# Patient Record
Sex: Male | Born: 1945 | Race: Black or African American | Hispanic: No | Marital: Married | Smoking: Former smoker
Health system: Southern US, Community
[De-identification: ages and names within clinical notes are randomized; demographics above are authoritative.]

## PROBLEM LIST (undated history)

## (undated) DIAGNOSIS — I2510 Atherosclerotic heart disease of native coronary artery without angina pectoris: Secondary | ICD-10-CM

## (undated) DIAGNOSIS — F17210 Nicotine dependence, cigarettes, uncomplicated: Secondary | ICD-10-CM

## (undated) DIAGNOSIS — F99 Mental disorder, not otherwise specified: Secondary | ICD-10-CM

## (undated) DIAGNOSIS — I10 Essential (primary) hypertension: Secondary | ICD-10-CM

## (undated) DIAGNOSIS — K589 Irritable bowel syndrome without diarrhea: Secondary | ICD-10-CM

## (undated) DIAGNOSIS — J45909 Unspecified asthma, uncomplicated: Secondary | ICD-10-CM

## (undated) DIAGNOSIS — F1010 Alcohol abuse, uncomplicated: Secondary | ICD-10-CM

## (undated) DIAGNOSIS — N400 Enlarged prostate without lower urinary tract symptoms: Secondary | ICD-10-CM

## (undated) DIAGNOSIS — M1990 Unspecified osteoarthritis, unspecified site: Secondary | ICD-10-CM

## (undated) DIAGNOSIS — I219 Acute myocardial infarction, unspecified: Secondary | ICD-10-CM

## (undated) DIAGNOSIS — M549 Dorsalgia, unspecified: Secondary | ICD-10-CM

## (undated) DIAGNOSIS — M542 Cervicalgia: Secondary | ICD-10-CM

## (undated) HISTORY — DX: Benign prostatic hyperplasia without lower urinary tract symptoms: N40.0

## (undated) HISTORY — PX: CARDIAC CATHETERIZATION: SHX172

## (undated) HISTORY — PX: TONSILLECTOMY: SUR1361

## (undated) HISTORY — DX: Dorsalgia, unspecified: M54.9

## (undated) HISTORY — DX: Alcohol abuse, uncomplicated: F10.10

## (undated) HISTORY — DX: Irritable bowel syndrome, unspecified: K58.9

## (undated) HISTORY — DX: Nicotine dependence, cigarettes, uncomplicated: F17.210

## (undated) HISTORY — DX: Unspecified asthma, uncomplicated: J45.909

## (undated) HISTORY — DX: Atherosclerotic heart disease of native coronary artery without angina pectoris: I25.10

## (undated) HISTORY — DX: Mental disorder, not otherwise specified: F99

## (undated) HISTORY — PX: HAND SURGERY: SHX662

## (undated) HISTORY — DX: Acute myocardial infarction, unspecified: I21.9

## (undated) HISTORY — DX: Unspecified osteoarthritis, unspecified site: M19.90

## (undated) HISTORY — DX: Cervicalgia: M54.2

---

## 1997-05-06 HISTORY — PX: CYSTOSCOPY: SUR368

## 1998-07-24 ENCOUNTER — Other Ambulatory Visit: Admission: RE | Admit: 1998-07-24 | Discharge: 1998-07-24 | Payer: Self-pay | Admitting: Urology

## 1998-12-22 ENCOUNTER — Emergency Department (HOSPITAL_COMMUNITY): Admission: EM | Admit: 1998-12-22 | Discharge: 1998-12-22 | Payer: Self-pay | Admitting: Emergency Medicine

## 1999-04-08 ENCOUNTER — Emergency Department (HOSPITAL_COMMUNITY): Admission: EM | Admit: 1999-04-08 | Discharge: 1999-04-08 | Payer: Self-pay | Admitting: Emergency Medicine

## 1999-05-11 ENCOUNTER — Emergency Department (HOSPITAL_COMMUNITY): Admission: EM | Admit: 1999-05-11 | Discharge: 1999-05-11 | Payer: Self-pay | Admitting: Emergency Medicine

## 1999-05-11 ENCOUNTER — Encounter: Payer: Self-pay | Admitting: Emergency Medicine

## 1999-11-22 ENCOUNTER — Encounter: Payer: Self-pay | Admitting: *Deleted

## 1999-11-22 ENCOUNTER — Ambulatory Visit (HOSPITAL_COMMUNITY): Admission: RE | Admit: 1999-11-22 | Discharge: 1999-11-22 | Payer: Self-pay | Admitting: *Deleted

## 2000-05-03 ENCOUNTER — Encounter: Payer: Self-pay | Admitting: Emergency Medicine

## 2000-05-03 ENCOUNTER — Emergency Department (HOSPITAL_COMMUNITY): Admission: EM | Admit: 2000-05-03 | Discharge: 2000-05-03 | Payer: Self-pay | Admitting: Emergency Medicine

## 2000-08-23 ENCOUNTER — Emergency Department (HOSPITAL_COMMUNITY): Admission: EM | Admit: 2000-08-23 | Discharge: 2000-08-23 | Payer: Self-pay | Admitting: Internal Medicine

## 2000-08-23 ENCOUNTER — Encounter: Payer: Self-pay | Admitting: Emergency Medicine

## 2002-11-13 ENCOUNTER — Emergency Department (HOSPITAL_COMMUNITY): Admission: EM | Admit: 2002-11-13 | Discharge: 2002-11-13 | Payer: Self-pay

## 2002-11-26 ENCOUNTER — Encounter: Payer: Self-pay | Admitting: Emergency Medicine

## 2002-11-26 ENCOUNTER — Emergency Department (HOSPITAL_COMMUNITY): Admission: EM | Admit: 2002-11-26 | Discharge: 2002-11-26 | Payer: Self-pay | Admitting: Emergency Medicine

## 2002-12-07 ENCOUNTER — Encounter: Admission: RE | Admit: 2002-12-07 | Discharge: 2003-03-02 | Payer: Self-pay | Admitting: Sports Medicine

## 2002-12-17 ENCOUNTER — Encounter: Payer: Self-pay | Admitting: Gastroenterology

## 2002-12-17 ENCOUNTER — Encounter: Admission: RE | Admit: 2002-12-17 | Discharge: 2002-12-17 | Payer: Self-pay | Admitting: Gastroenterology

## 2003-02-16 ENCOUNTER — Encounter: Admission: RE | Admit: 2003-02-16 | Discharge: 2003-05-17 | Payer: Self-pay | Admitting: Sports Medicine

## 2003-04-20 ENCOUNTER — Ambulatory Visit (HOSPITAL_COMMUNITY): Admission: RE | Admit: 2003-04-20 | Discharge: 2003-04-20 | Payer: Self-pay | Admitting: Sports Medicine

## 2003-05-18 ENCOUNTER — Encounter: Admission: RE | Admit: 2003-05-18 | Discharge: 2003-05-24 | Payer: Self-pay | Admitting: Sports Medicine

## 2003-07-26 ENCOUNTER — Emergency Department (HOSPITAL_COMMUNITY): Admission: EM | Admit: 2003-07-26 | Discharge: 2003-07-26 | Payer: Self-pay | Admitting: Emergency Medicine

## 2003-09-06 ENCOUNTER — Encounter: Admission: RE | Admit: 2003-09-06 | Discharge: 2003-09-06 | Payer: Self-pay | Admitting: Sports Medicine

## 2004-08-31 ENCOUNTER — Ambulatory Visit: Payer: Self-pay | Admitting: Internal Medicine

## 2004-09-24 ENCOUNTER — Ambulatory Visit: Payer: Self-pay | Admitting: Pulmonary Disease

## 2004-11-14 ENCOUNTER — Ambulatory Visit: Payer: Self-pay | Admitting: Pulmonary Disease

## 2005-02-04 ENCOUNTER — Ambulatory Visit: Payer: Self-pay | Admitting: Pulmonary Disease

## 2005-02-07 ENCOUNTER — Ambulatory Visit: Payer: Self-pay | Admitting: Pulmonary Disease

## 2005-02-20 ENCOUNTER — Ambulatory Visit: Payer: Self-pay | Admitting: Pulmonary Disease

## 2005-06-06 ENCOUNTER — Ambulatory Visit: Payer: Self-pay | Admitting: Pulmonary Disease

## 2005-11-21 ENCOUNTER — Ambulatory Visit: Payer: Self-pay | Admitting: Pulmonary Disease

## 2006-05-09 ENCOUNTER — Ambulatory Visit: Payer: Self-pay | Admitting: Pulmonary Disease

## 2006-05-09 LAB — CONVERTED CEMR LAB
ALT: 20 units/L (ref 0–40)
AST: 24 units/L (ref 0–37)
Albumin: 3.6 g/dL (ref 3.5–5.2)
Alkaline Phosphatase: 90 units/L (ref 39–117)
BUN: 8 mg/dL (ref 6–23)
Basophils Relative: 0.7 % (ref 0.0–1.0)
CO2: 31 meq/L (ref 19–32)
Calcium: 9.2 mg/dL (ref 8.4–10.5)
Eosinophil percent: 1.7 % (ref 0.0–5.0)
HCT: 38.6 % — ABNORMAL LOW (ref 39.0–52.0)
Lymphocytes Relative: 16.9 % (ref 12.0–46.0)
MCV: 89.2 fL (ref 78.0–100.0)
Platelets: 256 10*3/uL (ref 150–400)
Potassium: 3.8 meq/L (ref 3.5–5.1)
RBC: 4.32 M/uL (ref 4.22–5.81)
Sodium: 142 meq/L (ref 135–145)
TSH: 2.76 microintl units/mL (ref 0.35–5.50)
Total Protein: 7.3 g/dL (ref 6.0–8.3)
WBC: 8.4 10*3/uL (ref 4.5–10.5)

## 2006-10-04 ENCOUNTER — Emergency Department (HOSPITAL_COMMUNITY): Admission: EM | Admit: 2006-10-04 | Discharge: 2006-10-05 | Payer: Self-pay | Admitting: Emergency Medicine

## 2007-02-02 ENCOUNTER — Ambulatory Visit: Payer: Self-pay | Admitting: Pulmonary Disease

## 2007-05-12 ENCOUNTER — Encounter: Payer: Self-pay | Admitting: Pulmonary Disease

## 2007-05-12 ENCOUNTER — Telehealth (INDEPENDENT_AMBULATORY_CARE_PROVIDER_SITE_OTHER): Payer: Self-pay | Admitting: *Deleted

## 2007-05-12 DIAGNOSIS — F1010 Alcohol abuse, uncomplicated: Secondary | ICD-10-CM

## 2007-05-12 DIAGNOSIS — Z8719 Personal history of other diseases of the digestive system: Secondary | ICD-10-CM | POA: Insufficient documentation

## 2007-07-11 ENCOUNTER — Emergency Department (HOSPITAL_COMMUNITY): Admission: EM | Admit: 2007-07-11 | Discharge: 2007-07-11 | Payer: Self-pay | Admitting: Emergency Medicine

## 2007-12-24 ENCOUNTER — Ambulatory Visit: Payer: Self-pay | Admitting: Pulmonary Disease

## 2007-12-24 DIAGNOSIS — M549 Dorsalgia, unspecified: Secondary | ICD-10-CM

## 2008-06-28 ENCOUNTER — Ambulatory Visit: Payer: Self-pay | Admitting: Pulmonary Disease

## 2008-06-28 DIAGNOSIS — I2510 Atherosclerotic heart disease of native coronary artery without angina pectoris: Secondary | ICD-10-CM | POA: Insufficient documentation

## 2008-06-28 DIAGNOSIS — F489 Nonpsychotic mental disorder, unspecified: Secondary | ICD-10-CM

## 2008-06-28 DIAGNOSIS — M1990 Unspecified osteoarthritis, unspecified site: Secondary | ICD-10-CM

## 2008-06-28 DIAGNOSIS — M542 Cervicalgia: Secondary | ICD-10-CM

## 2008-06-28 DIAGNOSIS — I10 Essential (primary) hypertension: Secondary | ICD-10-CM

## 2008-06-28 DIAGNOSIS — Z720 Tobacco use: Secondary | ICD-10-CM | POA: Insufficient documentation

## 2008-06-28 DIAGNOSIS — J209 Acute bronchitis, unspecified: Secondary | ICD-10-CM | POA: Insufficient documentation

## 2008-06-28 DIAGNOSIS — F17200 Nicotine dependence, unspecified, uncomplicated: Secondary | ICD-10-CM

## 2008-07-04 LAB — CONVERTED CEMR LAB
AST: 17 units/L (ref 0–37)
Alkaline Phosphatase: 74 units/L (ref 39–117)
Basophils Absolute: 0.1 10*3/uL (ref 0.0–0.1)
Basophils Relative: 1.8 % (ref 0.0–3.0)
Bilirubin, Direct: 0.1 mg/dL (ref 0.0–0.3)
CO2: 27 meq/L (ref 19–32)
Calcium: 10.1 mg/dL (ref 8.4–10.5)
Cholesterol: 180 mg/dL (ref 0–200)
Eosinophils Absolute: 0 10*3/uL (ref 0.0–0.7)
Eosinophils Relative: 0.4 % (ref 0.0–5.0)
GFR calc Af Amer: 97 mL/min
GFR calc non Af Amer: 80 mL/min
HDL: 69.4 mg/dL (ref >39.0)
Monocytes Relative: 6.4 % (ref 3.0–12.0)
Neutrophils Relative %: 54 % (ref 43.0–77.0)
PSA: 0.38 ng/mL (ref 0.10–4.00)
Platelets: 251 10*3/uL (ref 150–400)
Potassium: 4.6 meq/L (ref 3.5–5.1)
RBC: 5.33 M/uL (ref 4.22–5.81)
Total CHOL/HDL Ratio: 2.6
Total Protein: 7.6 g/dL (ref 6.0–8.3)
Triglycerides: 54 mg/dL (ref 0–149)
VLDL: 11 mg/dL (ref 0–40)
WBC: 5.1 10*3/uL (ref 4.5–10.5)

## 2008-08-17 ENCOUNTER — Encounter: Payer: Self-pay | Admitting: Pulmonary Disease

## 2008-10-06 ENCOUNTER — Emergency Department (HOSPITAL_COMMUNITY): Admission: EM | Admit: 2008-10-06 | Discharge: 2008-10-07 | Payer: Self-pay | Admitting: Emergency Medicine

## 2008-10-07 ENCOUNTER — Encounter
Admission: RE | Admit: 2008-10-07 | Discharge: 2008-10-11 | Payer: Self-pay | Admitting: Physical Medicine & Rehabilitation

## 2008-10-11 ENCOUNTER — Ambulatory Visit: Payer: Self-pay | Admitting: Physical Medicine & Rehabilitation

## 2008-12-06 ENCOUNTER — Emergency Department (HOSPITAL_COMMUNITY): Admission: EM | Admit: 2008-12-06 | Discharge: 2008-12-06 | Payer: Self-pay | Admitting: Emergency Medicine

## 2008-12-26 ENCOUNTER — Encounter
Admission: RE | Admit: 2008-12-26 | Discharge: 2008-12-29 | Payer: Self-pay | Admitting: Physical Medicine & Rehabilitation

## 2008-12-29 ENCOUNTER — Ambulatory Visit: Payer: Self-pay | Admitting: Physical Medicine & Rehabilitation

## 2008-12-30 ENCOUNTER — Emergency Department (HOSPITAL_COMMUNITY): Admission: EM | Admit: 2008-12-30 | Discharge: 2008-12-30 | Payer: Self-pay | Admitting: Emergency Medicine

## 2009-01-03 ENCOUNTER — Encounter
Admission: RE | Admit: 2009-01-03 | Discharge: 2009-01-18 | Payer: Self-pay | Admitting: Physical Medicine & Rehabilitation

## 2009-06-13 ENCOUNTER — Emergency Department (HOSPITAL_COMMUNITY): Admission: EM | Admit: 2009-06-13 | Discharge: 2009-06-14 | Payer: Self-pay | Admitting: Emergency Medicine

## 2009-08-06 ENCOUNTER — Emergency Department (HOSPITAL_COMMUNITY): Admission: EM | Admit: 2009-08-06 | Discharge: 2009-08-06 | Payer: Self-pay | Admitting: Emergency Medicine

## 2009-09-11 ENCOUNTER — Telehealth: Payer: Self-pay | Admitting: Pulmonary Disease

## 2009-12-23 ENCOUNTER — Emergency Department (HOSPITAL_COMMUNITY): Admission: EM | Admit: 2009-12-23 | Discharge: 2009-12-23 | Payer: Self-pay | Admitting: Emergency Medicine

## 2010-01-17 ENCOUNTER — Emergency Department (HOSPITAL_COMMUNITY): Admission: EM | Admit: 2010-01-17 | Discharge: 2010-01-17 | Payer: Self-pay | Admitting: Emergency Medicine

## 2010-03-23 ENCOUNTER — Emergency Department (HOSPITAL_COMMUNITY): Admission: EM | Admit: 2010-03-23 | Discharge: 2010-03-23 | Payer: Self-pay | Admitting: Emergency Medicine

## 2010-04-05 ENCOUNTER — Emergency Department (HOSPITAL_COMMUNITY)
Admission: EM | Admit: 2010-04-05 | Discharge: 2010-04-05 | Payer: Self-pay | Source: Home / Self Care | Admitting: Emergency Medicine

## 2010-04-28 ENCOUNTER — Emergency Department (HOSPITAL_COMMUNITY)
Admission: EM | Admit: 2010-04-28 | Discharge: 2010-04-28 | Payer: Self-pay | Source: Home / Self Care | Admitting: Emergency Medicine

## 2010-05-28 ENCOUNTER — Ambulatory Visit: Admission: EM | Admit: 2010-05-28 | Payer: Self-pay | Source: Home / Self Care | Admitting: Pulmonary Disease

## 2010-05-29 ENCOUNTER — Telehealth: Payer: Self-pay | Admitting: Pulmonary Disease

## 2010-06-05 NOTE — Progress Notes (Signed)
Summary: nos appt  Phone Note Call from Patient   Caller: [email or web address]  Call For: nadel Summary of Call: ATC pt, phone disconnected contact info, number is incorrect. Initial call taken by: Darletta Moll,  Sep 11, 2009 10:51 AM

## 2010-06-06 ENCOUNTER — Emergency Department (HOSPITAL_COMMUNITY)
Admission: EM | Admit: 2010-06-06 | Discharge: 2010-06-06 | Disposition: A | Discharge status: Home / Self Care | Payer: Medicare Other | Attending: Emergency Medicine | Admitting: Emergency Medicine

## 2010-06-06 DIAGNOSIS — I10 Essential (primary) hypertension: Secondary | ICD-10-CM | POA: Insufficient documentation

## 2010-06-06 DIAGNOSIS — M545 Low back pain, unspecified: Secondary | ICD-10-CM | POA: Insufficient documentation

## 2010-06-06 DIAGNOSIS — R599 Enlarged lymph nodes, unspecified: Secondary | ICD-10-CM | POA: Insufficient documentation

## 2010-06-06 DIAGNOSIS — M79609 Pain in unspecified limb: Secondary | ICD-10-CM | POA: Insufficient documentation

## 2010-06-06 DIAGNOSIS — G8929 Other chronic pain: Secondary | ICD-10-CM | POA: Insufficient documentation

## 2010-06-07 NOTE — Progress Notes (Signed)
Summary: nos appt  Phone Note Call from Patient   Caller: [email or web address]  Call For: Roark Rufo Summary of Call: ATC pt to [facility]sc nos from 1/23 phone disconnected. Initial call taken by: Darletta Moll,  May 29, 2010 9:43 AM

## 2010-06-24 ENCOUNTER — Emergency Department (HOSPITAL_COMMUNITY)
Admission: EM | Admit: 2010-06-24 | Discharge: 2010-06-24 | Disposition: A | Discharge status: Home / Self Care | Payer: Medicare Other | Attending: Emergency Medicine | Admitting: Emergency Medicine

## 2010-06-24 DIAGNOSIS — I10 Essential (primary) hypertension: Secondary | ICD-10-CM | POA: Insufficient documentation

## 2010-06-24 DIAGNOSIS — M545 Low back pain, unspecified: Secondary | ICD-10-CM | POA: Insufficient documentation

## 2010-06-24 DIAGNOSIS — G8929 Other chronic pain: Secondary | ICD-10-CM | POA: Insufficient documentation

## 2010-06-24 DIAGNOSIS — R109 Unspecified abdominal pain: Secondary | ICD-10-CM | POA: Insufficient documentation

## 2010-06-24 DIAGNOSIS — M546 Pain in thoracic spine: Secondary | ICD-10-CM | POA: Insufficient documentation

## 2010-06-24 DIAGNOSIS — IMO0001 Reserved for inherently not codable concepts without codable children: Secondary | ICD-10-CM | POA: Insufficient documentation

## 2010-07-25 LAB — CBC
HCT: 41.2 % (ref 39.0–52.0)
Hemoglobin: 13.8 g/dL (ref 13.0–17.0)
Platelets: 202 10*3/uL (ref 150–400)
RBC: 4.41 MIL/uL (ref 4.22–5.81)
RBC: 4.46 MIL/uL (ref 4.22–5.81)
RDW: 13.5 % (ref 11.5–15.5)
WBC: 4.6 10*3/uL (ref 4.0–10.5)
WBC: 5.2 10*3/uL (ref 4.0–10.5)

## 2010-07-25 LAB — POCT CARDIAC MARKERS
CKMB, poc: <1 ng/mL — ABNORMAL LOW (ref 1.0–8.0)
CKMB, poc: <1 ng/mL — ABNORMAL LOW (ref 1.0–8.0)
Myoglobin, poc: 85.3 ng/mL (ref 12–200)
Troponin i, poc: <0.05 ng/mL (ref 0.00–0.09)

## 2010-07-25 LAB — BASIC METABOLIC PANEL
BUN: 11 mg/dL (ref 6–23)
BUN: 9 mg/dL (ref 6–23)
Calcium: 8.9 mg/dL (ref 8.4–10.5)
Calcium: 9.3 mg/dL (ref 8.4–10.5)
Creatinine, Ser: 1.18 mg/dL (ref 0.4–1.5)
GFR calc Af Amer: >60 mL/min (ref >60)
GFR calc non Af Amer: >60 mL/min (ref >60)
Glucose, Bld: 105 mg/dL — ABNORMAL HIGH (ref 70–99)
Potassium: 4 mEq/L (ref 3.5–5.1)
Sodium: 137 mEq/L (ref 135–145)

## 2010-07-25 LAB — DIFFERENTIAL
Basophils Relative: 0 % (ref 0–1)
Eosinophils Relative: 1 % (ref 0–5)
Lymphocytes Relative: 41 % (ref 12–46)
Lymphs Abs: 1.9 10*3/uL (ref 0.7–4.0)
Monocytes Absolute: 0.4 10*3/uL (ref 0.1–1.0)
Monocytes Absolute: 0.3 10*3/uL (ref 0.1–1.0)
Monocytes Relative: 8 % (ref 3–12)
Monocytes Relative: 6 % (ref 3–12)
Neutro Abs: 2.2 10*3/uL (ref 1.7–7.7)
Neutrophils Relative %: 66 % (ref 43–77)

## 2010-07-25 LAB — D-DIMER, QUANTITATIVE: D-Dimer, Quant: 0.23 ug/mL-FEU (ref 0.00–0.48)

## 2010-08-11 LAB — POCT I-STAT, CHEM 8
BUN: 9 mg/dL (ref 6–23)
Calcium, Ion: 1.17 mmol/L (ref 1.12–1.32)
Chloride: 101 meq/L (ref 96–112)
Creatinine, Ser: 1.3 mg/dL (ref 0.4–1.5)
Glucose, Bld: 94 mg/dL (ref 70–99)
HCT: 46 % (ref 39.0–52.0)
Hemoglobin: 15.6 g/dL (ref 13.0–17.0)
Potassium: 3.8 mEq/L (ref 3.5–5.1)
Sodium: 138 mEq/L (ref 135–145)
TCO2: 25 mmol/L (ref 0–100)

## 2010-08-11 LAB — POCT CARDIAC MARKERS
CKMB, poc: <1 ng/mL — ABNORMAL LOW (ref 1.0–8.0)
Myoglobin, poc: 106 ng/mL (ref 12–200)
Troponin i, poc: <0.05 ng/mL (ref 0.00–0.09)

## 2010-08-11 LAB — D-DIMER, QUANTITATIVE: D-Dimer, Quant: 0.36 ug{FEU}/mL (ref 0.00–0.48)

## 2010-08-13 LAB — TROPONIN I: Troponin I: 0.03 ng/mL (ref 0.00–0.06)

## 2010-08-13 LAB — BASIC METABOLIC PANEL
CO2: 23 mEq/L (ref 19–32)
Calcium: 9.4 mg/dL (ref 8.4–10.5)
GFR calc Af Amer: >60 mL/min (ref >60)
Sodium: 138 mEq/L (ref 135–145)

## 2010-08-13 LAB — DIFFERENTIAL
Basophils Absolute: 0.1 10*3/uL (ref 0.0–0.1)
Lymphocytes Relative: 36 % (ref 12–46)
Monocytes Absolute: 0.4 10*3/uL (ref 0.1–1.0)
Neutro Abs: 3.2 10*3/uL (ref 1.7–7.7)

## 2010-08-13 LAB — CK TOTAL AND CKMB (NOT AT ARMC)
CK, MB: 2.5 ng/mL (ref 0.3–4.0)
Relative Index: 0.7 (ref 0.0–2.5)

## 2010-08-13 LAB — CBC
Hemoglobin: 15.5 g/dL (ref 13.0–17.0)
MCHC: 34 g/dL (ref 30.0–36.0)
RBC: 4.89 MIL/uL (ref 4.22–5.81)
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
## 2010-09-18 NOTE — Assessment & Plan Note (Signed)
A 65 year old male, who was last seen by me with initial consultation on  October 11, 2008.  He has been previously evaluated by spine surgery in  April for the low back and buttock pain as well as neck pain.  He has  had imaging studies showing L5-S1 disk narrowing with endplate changes,  but no definite nerve root encroachment.  He also has cervical  spondylosis noted, but no significant cord impingement or foraminal  stenosis.  Not felt to be a surgical candidate.   His social history is complicated.  He has a history of alcoholism.  Last drink was sometime early June or late May per his report.  He also  shared with me today and he was incarcerated up until January of this  year.   He is in the interval time gone to the ER to resume some pain  medications.  I did not think he was a good narcotic analgesic candidate  due to his ethanol abuse history.   A urine drug screen also demonstrated some non reported Valium.   PHYSICAL EXAMINATION:  GENERAL:  No acute distress.  Mood and affect  appropriate.  EXTREMITIES:  Without edema.  His gait is normal.  His range of motion  is 75% range in the cervical spine, forward flexion, extension, lateral  rotation, and bending.  In the lumbar spine, he has 100% forward  flexion, 75% extension.  Deep tendon reflexes are normal in the upper  and lower extremity.  Range of motion normal in the upper and lower  extremity.  Sensation is normal in the upper and lower extremity.   IMPRESSION:  1. Cervical spondylosis with chronic neck pain.  No radicular      discomfort.  2. Lumbar degenerative disk.  No radicular symptoms.   RECOMMENDATIONS:  I had previously recommended physical therapy to treat  the above.  He has not followed through on this and that has gone to the  ED and received some narcotic analgesics.   I have rewritten CT orders.  I will see him back in 3 months, but I  would first like for him to go through therapy before I reevaluate  him  to see what the next step is.      Erick Colace, M.D.  Electronically Signed     AEK/MedQ  D:  12/29/2008 12:40:17  T:  12/30/2008 02:21:31  Job #:  [id number]

## 2010-09-21 NOTE — Cardiovascular Report (Signed)
[facility]  Patient:    Steven Lowery, Steven Lowery                      MRN: [id number] Proc. Date: 11/22/99 Adm. Date:  [id number] Attending:  Daisey Must CC:         Lonzo Cloud. Kriste Basque, M.D. [facility]             Daisey Must, M.D. [facility]             Cardiac Catheterization Lab                        Cardiac Catheterization  PROCEDURES PERFORMED: 1. Left heart catheterization. 2. Coronary angiography. 3. Left ventriculography.  INDICATIONS:  Steven Lowery is a 65 year old male who presented with chest pain. A stress Cardiolite was interpreted as revealing mild anterior ischemia.  PROCEDURAL NOTE:  A 6-French sheath was placed in the right femoral artery. Standard Judkins 6-French catheters were utilized.  CONTRAST:  Omnipaque.  COMPLICATIONS:  None.  RESULTS:  HEMODYNAMICS: 1. Left ventricular pressure:  140/13. 2. Aortic pressure:  152/80.  There was no aortic valve gradient.  LEFT VENTRICULOGRAM:  Wall motion is normal.  Ejection fraction estimated at 60%.  CORONARY ANGIOGRAPHY: (Right dominant). 1. LEFT MAIN:  Normal. 2. LEFT ANTERIOR DESCENDING:  Has a 40% stenosis in the mid vessel, just    after the first diagonal branch.  The LAD gives rise to a normal-sized    first diagonal branch and a small second diagonal branch. 3. LEFT CIRCUMFLEX:  Gives rise to a small OM1, normal-sized OM2 and    normal-sized OM3.  The left circumflex is free of angiographic disease. 4. RIGHT CORONARY ARTERY:  Gives rise to a normal-sized posterior descending    artery, a small posterolateral branch.  The right coronary artery is free    of angiographic disease.  IMPRESSION: 1. Normal left ventricular systolic function. 2. Nonobstructive coronary artery disease. DD:  11/22/99 TD:  11/22/99 Job: [id number]

## 2010-09-21 NOTE — Letter (Signed)
May 15, 2006    Mark C. Vernie Ammons, M.D.  [address]  [place], [place] [address]   RE:  Steven Lowery, Steven Lowery  MRN:  [id number]  /  DOB:  [birth year]   Dear Loraine Leriche:   Mr. Steven Lowery is a 65 year old black gentleman whom I have seen for  general medical purposes.  He has a history of asthmatic bronchitis,  nonobstructive coronary disease, irritable bowel syndrome, degenerative  arthritis with low back pain.  He has seen Dr. Farris Has for orthopedics  and Dr. Victorino Dike for GI.   He presents with symptoms of an irritated bladder.  He states that he  goes to the bathroom about every hour and has done so for quite a while.  He tried Detrol LA and I recommended urologic evaluation, but he never  went for this evaluation previously.  He also has erectile dysfunction  and uses Viagra.   CURRENT MEDICATIONS:  1. Enteric-coated aspirin 1 a day.  2. Neurontin 600 mg p.o. b.i.d.  3. Nexium 40 mg p.o. daily.  4. Vicodin every 8 hours as needed for pain.  5. Viagra as needed.   I did give him some samples of Vesicare 5 mg to take 1 a day and  recommended the urologic evaluation because of his irritated bladder  symptoms, and (I should note) a history of blood in  his urine per his history.  In reviewing my old records, he had a  urinalysis last year that showed 5-10 red cells and a culture that  showed 5000 colonies only.   Thank you very much for evaluation of this nice gentleman.  I look  forward to hearing your comments.    Sincerely,      Lonzo Cloud. Kriste Basque, MD  Electronically Signed    SMN/MedQ  DD: 05/15/2006  DT: 05/15/2006  Job #: [id number]

## 2010-10-08 ENCOUNTER — Emergency Department (HOSPITAL_COMMUNITY)
Admission: EM | Admit: 2010-10-08 | Discharge: 2010-10-09 | Disposition: A | Discharge status: Home / Self Care | Payer: Medicare Other | Attending: Emergency Medicine | Admitting: Emergency Medicine

## 2010-10-08 DIAGNOSIS — M542 Cervicalgia: Secondary | ICD-10-CM | POA: Insufficient documentation

## 2010-10-08 DIAGNOSIS — G8929 Other chronic pain: Secondary | ICD-10-CM | POA: Insufficient documentation

## 2010-10-08 DIAGNOSIS — M549 Dorsalgia, unspecified: Secondary | ICD-10-CM | POA: Insufficient documentation

## 2010-10-08 DIAGNOSIS — Y92009 Unspecified place in unspecified non-institutional (private) residence as the place of occurrence of the external cause: Secondary | ICD-10-CM | POA: Insufficient documentation

## 2010-10-08 DIAGNOSIS — I10 Essential (primary) hypertension: Secondary | ICD-10-CM | POA: Insufficient documentation

## 2010-10-08 DIAGNOSIS — R1310 Dysphagia, unspecified: Secondary | ICD-10-CM | POA: Insufficient documentation

## 2010-10-08 DIAGNOSIS — IMO0002 Reserved for concepts with insufficient information to code with codable children: Secondary | ICD-10-CM | POA: Insufficient documentation

## 2010-10-08 DIAGNOSIS — S0993XA Unspecified injury of face, initial encounter: Secondary | ICD-10-CM | POA: Insufficient documentation

## 2010-10-08 DIAGNOSIS — Z7982 Long term (current) use of aspirin: Secondary | ICD-10-CM | POA: Insufficient documentation

## 2010-10-08 DIAGNOSIS — Z79899 Other long term (current) drug therapy: Secondary | ICD-10-CM | POA: Insufficient documentation

## 2010-10-08 DIAGNOSIS — T148XXA Other injury of unspecified body region, initial encounter: Secondary | ICD-10-CM | POA: Insufficient documentation

## 2010-10-09 ENCOUNTER — Emergency Department (HOSPITAL_COMMUNITY): Payer: Medicare Other

## 2010-11-02 ENCOUNTER — Emergency Department (HOSPITAL_COMMUNITY): Payer: Medicare Other

## 2010-11-02 ENCOUNTER — Encounter (HOSPITAL_COMMUNITY): Payer: Self-pay | Admitting: Radiology

## 2010-11-02 ENCOUNTER — Inpatient Hospital Stay (HOSPITAL_COMMUNITY)
Admission: EM | Admit: 2010-11-02 | Discharge: 2010-11-03 | DRG: 313 | Disposition: A | Discharge status: Home / Self Care | Payer: Medicare Other | Attending: Cardiology | Admitting: Cardiology

## 2010-11-02 DIAGNOSIS — R0789 Other chest pain: Principal | ICD-10-CM | POA: Diagnosis present

## 2010-11-02 DIAGNOSIS — I10 Essential (primary) hypertension: Secondary | ICD-10-CM | POA: Diagnosis present

## 2010-11-02 DIAGNOSIS — N400 Enlarged prostate without lower urinary tract symptoms: Secondary | ICD-10-CM | POA: Diagnosis present

## 2010-11-02 DIAGNOSIS — Z7982 Long term (current) use of aspirin: Secondary | ICD-10-CM

## 2010-11-02 DIAGNOSIS — F341 Dysthymic disorder: Secondary | ICD-10-CM | POA: Diagnosis present

## 2010-11-02 DIAGNOSIS — I2510 Atherosclerotic heart disease of native coronary artery without angina pectoris: Secondary | ICD-10-CM | POA: Diagnosis present

## 2010-11-02 DIAGNOSIS — F17200 Nicotine dependence, unspecified, uncomplicated: Secondary | ICD-10-CM | POA: Diagnosis present

## 2010-11-02 DIAGNOSIS — K589 Irritable bowel syndrome without diarrhea: Secondary | ICD-10-CM | POA: Diagnosis present

## 2010-11-02 DIAGNOSIS — R079 Chest pain, unspecified: Secondary | ICD-10-CM

## 2010-11-02 HISTORY — DX: Essential (primary) hypertension: I10

## 2010-11-02 LAB — TROPONIN I: Troponin I: <0.3 ng/mL (ref <0.30)

## 2010-11-02 LAB — COMPREHENSIVE METABOLIC PANEL
ALT: 15 U/L (ref 0–53)
Alkaline Phosphatase: 87 U/L (ref 39–117)
CO2: 29 mEq/L (ref 19–32)
Chloride: 105 mEq/L (ref 96–112)
GFR calc Af Amer: >60 mL/min (ref >60)
GFR calc non Af Amer: >60 mL/min (ref >60)
Glucose, Bld: 108 mg/dL — ABNORMAL HIGH (ref 70–99)
Potassium: 4.5 mEq/L (ref 3.5–5.1)
Sodium: 139 mEq/L (ref 135–145)

## 2010-11-02 LAB — CBC
Hemoglobin: 13.2 g/dL (ref 13.0–17.0)
RBC: 4.32 MIL/uL (ref 4.22–5.81)

## 2010-11-02 LAB — CK TOTAL AND CKMB (NOT AT ARMC): Relative Index: INVALID (ref 0.0–2.5)

## 2010-11-02 MED ORDER — IOHEXOL 350 MG/ML SOLN: 80.0000 mL | Freq: Once | INTRAVENOUS | Status: AC | PRN

## 2010-11-03 ENCOUNTER — Inpatient Hospital Stay (HOSPITAL_COMMUNITY): Payer: Medicare Other

## 2010-11-03 LAB — LIPID PANEL
HDL: 69 mg/dL (ref >39)
LDL Cholesterol: 65 mg/dL (ref 0–99)
Total CHOL/HDL Ratio: 2.2 RATIO
Triglycerides: 78 mg/dL (ref <150)

## 2010-11-03 LAB — CARDIAC PANEL(CRET KIN+CKTOT+MB+TROPI): Relative Index: INVALID (ref 0.0–2.5)

## 2010-11-03 LAB — HEMOGLOBIN A1C
Hgb A1c MFr Bld: 5.8 % — ABNORMAL HIGH (ref <5.7)
Mean Plasma Glucose: 120 mg/dL — ABNORMAL HIGH (ref <117)

## 2010-11-03 MED ORDER — TECHNETIUM TC 99M TETROFOSMIN IV KIT
10.0000 | PACK | Freq: Once | INTRAVENOUS | Status: AC | PRN
  Administered 2010-11-03: 10 via INTRAVENOUS

## 2010-11-03 MED ORDER — TECHNETIUM TC 99M TETROFOSMIN IV KIT
30.0000 | PACK | Freq: Once | INTRAVENOUS | Status: AC | PRN
  Administered 2010-11-03: 30 via INTRAVENOUS

## 2010-11-04 ENCOUNTER — Other Ambulatory Visit (HOSPITAL_COMMUNITY): Payer: Medicare Other

## 2010-11-09 NOTE — Discharge Summary (Signed)
Steven Lowery, Steven Lowery NO.:  [id number]  MEDICAL RECORD NO.:  [id number]  LOCATION:  3711                         FACILITY:  [facility]  PHYSICIAN:  Jonelle Sidle, MD DATE OF BIRTH:  [birth year]  DATE OF ADMISSION:  11/02/2010 DATE OF DISCHARGE:  11/03/2010                              DISCHARGE SUMMARY   PRIMARY CARE PROVIDER:  Lonzo Cloud. Kriste Basque, MD  PRIMARY CARDIOLOGIST:  He used to see Dr. Gerri Spore.  DISCHARGE DIAGNOSES: 1. Atypical chest pain with negative stress Myoview this admission. 2. Nonobstructive coronary artery disease per cardiac catheterization     in 2001.     a.     Left anterior descending 40% with no other disease and an      ejection fraction of 60%. 3. Hypertension.  SECONDARY DIAGNOSES: 1. Irritable bowel syndrome. 2. Anxiety and depression. 3. Benign prostatic hypertrophy. 4. History of degenerative joint disease and cervical spondylosis.  ALLERGIES:  No known drug allergies.  PROCEDURES/DIAGNOSTICS:  Performed during hospitalization: 1. Stress Myoview on November 03, 2010:  Fixed defect of the inferior wall     consistent with artifact versus scar.  There was no definite     inducible ischemia with exercise stress.  Calculated ejection     fraction 49%. 2. CT angiography of the heart on November 02, 2010:  Moderate, possible     obstructive coronary artery disease.  Mixed calcified and     noncalcified plaque involving mid left anterior descending which is     possibly instructed.  No coronary artery disease elsewhere.  The     left ventricular hypertrophy noted.  Right coronary artery     dominant.  Total coronary artery calcium score 51.26.  Noted     chronic obstructive pulmonary disease/emphysema with scarring and     mild atelectasis in the visualized lung base. 3. Chest x-ray on November 02, 2010:  Emphysematous changes and scarring     in the lungs.  No evidence of active pulmonary disease.  REASON FOR HOSPITALIZATION:  This is a  65 year old African American gentleman with history of nonobstructive coronary artery disease as above who states he was in his usual state of health until several days prior to admission where he had onset of left-sided and substernal chest pain.  The patient complained his symptoms are coming from his neck down his left arm and possibly into his back.  Symptoms lasted greater than 24 hours.  They were not exertional or positional.  When his symptoms did not resolve, the patient presented to the emergency department for further evaluation.  His pain did resolve after administration of aspirin 325 mg, Lopressor 5 mg IV as well as 50 mg p.o., nitroglycerin paste, and 8 mg of morphine.  His EKG showed sinus rhythm at a rate of 64 beats per minute with no acute ischemic changes.  The patient underwent a cardiac CT showing possible obstructive LAD lesion.  With these results, Cardiology was asked to evaluate the patient further. The patient was admitted for further evaluation.  HOSPITAL COURSE:  The patient was admitted to the telemetry unit.  He did rule out myocardial infarction with cardiac enzymes being negative  x3.  He underwent a treadmill Myoview to assess the functional significance of the questionable 50% lesion in the LAD per cardiac CT. The patient had no further complaints of chest pain.  The patient's stress Myoview on November 03, 2010, showed a fixed defect of the inferior wall consistent with artifact versus a scar.  There is no definitive inducible ischemia with exercise stress.  The patient's calculated ejection fraction was 49%.  With the patient's atypical symptoms as well as low-risk Myoview, it was felt the patient was stable for discharge with no further ischemic evaluation.  On day of discharge, Dr. Diona Browner evaluated the patient and noted him stable for home.  There was a long discussion about consistent follow up with his primary care provider for better blood pressure  control.  He was started on lisinopril 5 mg daily for stabilization of his blood pressures.  The patient's lipid panel did show cholesterol 150, triglycerides 78, HDL 69, and LDL 65.  Further treatment at this time will be left to Dr. Kriste Basque.  The patient is also with complaints of arthralgias in his neck and back and again these should be followed up further with Dr. Kriste Basque.  The patient voiced understanding.  If the patient does have further cardiac symptoms, then he can further be assessed by Dr. Andrey Campanile.  Smoking cessation as well as watching alcohol intake have been encouraged.  DISCHARGE LABORATORY DATA:  Cardiac enzymes negative x3.  Hemoglobin A1c 5.8.  TSH 1.255, cholesterol 150, triglycerides 78, HDL 69, LDL 65.  DISCHARGE MEDICATIONS: 1. Aspirin 81 mg 1 tablet daily. 2. Lisinopril 5 mg 1 tablet daily. 3. Nitroglycerin 0.4 mg sublingual 1 tablet under the tongue at the     onset chest pain, may repeat every 5 minutes for up to three doses.  FOLLOWUP PLANS AND INSTRUCTIONS: 1. The patient is to follow up with Dr. Kriste Basque in the next 1-2 weeks,     and the office will call to schedule this appointment. 2. The patient is to increase activity as tolerated. 3. The patient is to continue low-sodium, heart-healthy diet. 4. The patient is to stop any activities that cause chest pain or     shortness of breath. 5. The patient is to call our office as needed.  DURATION OF DISCHARGE:  Greater than 30 minutes with physician.     Leonette Monarch, PA-C   ______________________________ Jonelle Sidle, MD    NB/MEDQ  D:  11/03/2010  T:  11/03/2010  Job:  [id number]  cc:   Lonzo Cloud. Kriste Basque, MD [facility]  Electronically Signed by Alen Blew P.A. on 11/08/2010 07:18:11 PM Electronically Signed by Nona Dell MD on 11/09/2010 04:47:29 PM

## 2010-12-03 NOTE — H&P (Signed)
Steven Lowery, MCCARLEY NO.:  [id number]  MEDICAL RECORD NO.:  [id number]  LOCATION:  3711                         FACILITY:  [facility]  PHYSICIAN:  Cassell Clement, M.D. DATE OF BIRTH:  [birth year]  DATE OF ADMISSION:  11/02/2010 DATE OF DISCHARGE:                             HISTORY & PHYSICAL   PRIMARY CARE PHYSICIAN:  Lonzo Cloud. Kriste Basque, MD  PRIMARY CARDIOLOGIST:  Carole Binning, MD, [facility]  CHIEF COMPLAINT:  Chest pain.  HISTORY OF PRESENT ILLNESS:  Mr. Whitecotton is a 65 year old male with a history of nonobstructive coronary artery disease.  He was in his usual state of health until October 31, 2010, p.m. when he had onset of left- sided and substernal chest pain.  It radiated to his left arm and possibly to his back.  The symptoms lasted all night.  No medication was tried.  There was no exertional component.  It was not positional or clearly pleuritic.  He came to the emergency room yesterday finally when his symptoms did not resolve.  He received multiple medications including aspirin 325 mg, Lopressor 5 mg IV and 50 mg p.o., 8 mg of morphine, nitroglycerin paste totaling 2 inches, and sublingual nitroglycerin.  His pain has now resolved, but he is not sure exactly when or which medicines made the most difference.  He had pain for more than 24 hours.  A cardiac CT showed a possibly obstructive LAD lesion, and Cardiology was asked to evaluate him.  Currently Mr. Lucchese is resting comfortably and says his chest pain is 0/10.  PAST MEDICAL HISTORY: 1. Status post cardiac catheterization in 2001 showing LAD 40%, no     other disease and an EF of 60%. 2. Borderline hypertension. 3. History of IBS. 4. History of asthmatic bronchitis. 5. History of anxiety and depression. 6. BPH. 7. History of DJD and cervical spondylosis.  SURGICAL HISTORY:  He is status post cardiac catheterization as well as cystoscopy and flex sig.  ALLERGIES:  No known drug  allergies.  CURRENT MEDICATIONS: 1. Aspirin. 2. Percocet. 3. Seroquel, doses unclear.  SOCIAL HISTORY:  He lives in [place] with family nearby.  He is a retired Data processing manager.  He has approximately 30 pack-year history of ongoing tobacco use.  He has a history of a binge drinking, but says he drinks a quart of beer about every other week now.  He denies drug use.  FAMILY HISTORY:  Both of his parents were in their 22s when they died, and his mother had cardiac issues, but his father did not.  He has at least one brother who has had an MI and two sisters who had pacemakers.  REVIEW OF SYSTEMS:  He has occasional arthralgias with neck and back pain.  He denies reflux symptoms or melena.  He coughs rarely, and it is nonproductive.  He has not had any recent illnesses, fevers, or chills. The chest pain is described alternately as a pressure and tightness. There are no other associated symptoms with it except possibly some shortness of breath.  The shortness of breath has resolved as well as the chest pain.  Full 14-point review of systems is otherwise negative except as stated  in the HPI.  PHYSICAL EXAMINATION:  VITAL SIGNS:  Temperature 97.9, blood pressure 108/61, heart rate 62, respiratory rate 16, O2 saturation 96% on room air. GENERAL:  He is a well-developed, well-nourished African American male in no acute distress. HEENT:  Normal. NECK:  There is no lymphadenopathy, thyromegaly, bruit, or JVD noted. CV:  His heart is regular in rate and rhythm with an S1 and S2, and no significant murmur or gallop is noted.  Distal pulses are intact in all four extremities. LUNGS:  Clear to auscultation bilaterally. SKIN:  No rashes or lesions are noted. ABDOMEN:  Soft and nontender with active bowel sounds. EXTREMITIES:  There is no cyanosis, clubbing, or edema noted. MUSCULOSKELETAL:  There is no joint deformity or effusions, and no spine or CVA tenderness. NEURO:  He is alert and  oriented with cranial nerves II through XII grossly intact.  CT angiogram of the chest showed possibly obstructive calcified and noncalcified plaque in the LAD near 50%, otherwise normal with a calcium score of 51.6.  Chest x-ray showed scarring and emphysema, but no acute disease.  EKG, sinus rhythm, rate 64 with no acute ischemic changes.  LABORATORY VALUES:  Hemoglobin 13.2, hematocrit 38, WBCs 4.8, platelets 200.  Sodium 139, potassium 4.5, chloride 105, CO2 of 29, BUN 20, creatinine 1.21, glucose 108.  CK-MB and troponin I negative x2.  IMPRESSION:  Mr. Lombardo was seen today by Dr. Patty Sermons, the patient evaluated and the data reviewed.  He has atypical chest pain that was finally relieved in the ER after multiple medications including aspirin, nitro, and morphine.  Cardiac CT shows a 50% LAD lesion, which had been 40% in cath in 2001.  His EKG shows sinus rhythm with no ischemic changes.  IMPRESSION:  We doubt the 50% lesion is obstructive, but we will get a treadmill Myoview to assess its functional significance.  If this is negative, home later Saturday with blood pressure followup by Dr. Kriste Basque. His blood pressure has been borderline for several years, and we will add lisinopril 5 mg daily to his medication regimen.     Theodore Demark, PA-C   ______________________________ Cassell Clement, M.D.    RB/MEDQ  D:  11/02/2010  T:  11/03/2010  Job:  [id number]  Electronically Signed by Theodore Demark PA-C on 11/20/2010 05:02:25 PM Electronically Signed by Cassell Clement M.D. on 12/03/2010 12:44:22 PM

## 2011-01-16 ENCOUNTER — Encounter: Payer: Self-pay | Admitting: Pulmonary Disease

## 2011-01-16 ENCOUNTER — Ambulatory Visit (INDEPENDENT_AMBULATORY_CARE_PROVIDER_SITE_OTHER): Payer: Medicare Other | Admitting: Pulmonary Disease

## 2011-01-16 DIAGNOSIS — F17200 Nicotine dependence, unspecified, uncomplicated: Secondary | ICD-10-CM

## 2011-01-16 DIAGNOSIS — I10 Essential (primary) hypertension: Secondary | ICD-10-CM

## 2011-01-16 DIAGNOSIS — Z23 Encounter for immunization: Secondary | ICD-10-CM

## 2011-01-16 DIAGNOSIS — M542 Cervicalgia: Secondary | ICD-10-CM

## 2011-01-16 DIAGNOSIS — J439 Emphysema, unspecified: Secondary | ICD-10-CM

## 2011-01-16 DIAGNOSIS — M1990 Unspecified osteoarthritis, unspecified site: Secondary | ICD-10-CM

## 2011-01-16 DIAGNOSIS — Z87898 Personal history of other specified conditions: Secondary | ICD-10-CM

## 2011-01-16 DIAGNOSIS — Z8719 Personal history of other diseases of the digestive system: Secondary | ICD-10-CM

## 2011-01-16 DIAGNOSIS — I2510 Atherosclerotic heart disease of native coronary artery without angina pectoris: Secondary | ICD-10-CM

## 2011-01-16 DIAGNOSIS — J438 Other emphysema: Secondary | ICD-10-CM

## 2011-01-16 MED ORDER — TRAMADOL HCL 50 MG PO TABS: 50.0000 mg | ORAL_TABLET | Freq: Three times a day (TID) | ORAL | Status: DC | PRN

## 2011-01-16 MED ORDER — LISINOPRIL 5 MG PO TABS: 5.0000 mg | ORAL_TABLET | Freq: Every day | ORAL | Status: DC

## 2011-01-16 NOTE — Patient Instructions (Signed)
Steven Lowery, it was good seeing you again...  Today we refilled your LISINOPRIL heart & pressure pill (take one daily);     And your TRAMADOL pain & inflammation pill (take up to 3 times daily as needed for pain)...  Today we did a pulmonary function test & the results indicate that you MUST QUIT ALL SMOKING IMMEDIATELY...  Let's plan a follow up appt in 4-6 months.Marland KitchenMarland Kitchen
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
## 2011-01-28 LAB — BASIC METABOLIC PANEL
CO2: 25
Chloride: 107
Creatinine, Ser: 0.93
GFR calc Af Amer: >60
Potassium: 3.7
Sodium: 142

## 2011-01-28 LAB — CBC
HCT: 43.3
Hemoglobin: 14.6
MCHC: 33.8
MCV: 90.5
RBC: 4.78

## 2011-01-28 LAB — ETHANOL: Alcohol, Ethyl (B): 222 — ABNORMAL HIGH

## 2011-01-28 LAB — RAPID URINE DRUG SCREEN, HOSP PERFORMED: Benzodiazepines: POSITIVE — AB

## 2011-02-23 ENCOUNTER — Emergency Department (HOSPITAL_COMMUNITY)
Admission: EM | Admit: 2011-02-23 | Discharge: 2011-02-23 | Disposition: A | Discharge status: Home / Self Care | Payer: Medicare Other | Attending: Emergency Medicine | Admitting: Emergency Medicine

## 2011-02-23 DIAGNOSIS — Z79899 Other long term (current) drug therapy: Secondary | ICD-10-CM | POA: Insufficient documentation

## 2011-02-23 DIAGNOSIS — G8929 Other chronic pain: Secondary | ICD-10-CM | POA: Insufficient documentation

## 2011-02-23 DIAGNOSIS — M25519 Pain in unspecified shoulder: Secondary | ICD-10-CM | POA: Insufficient documentation

## 2011-02-23 DIAGNOSIS — Z7982 Long term (current) use of aspirin: Secondary | ICD-10-CM | POA: Insufficient documentation

## 2011-02-23 DIAGNOSIS — I10 Essential (primary) hypertension: Secondary | ICD-10-CM | POA: Insufficient documentation

## 2011-02-25 ENCOUNTER — Telehealth: Payer: Self-pay | Admitting: Pulmonary Disease

## 2011-02-25 NOTE — Telephone Encounter (Signed)
No he will not give both rx for 2 pain meds.  Percocet and Vicodin are the same price  He can not have both.

## 2011-02-25 NOTE — Telephone Encounter (Signed)
Called, spoke with pt.  States he was seen at [facility] ED on Saturday for shoulder pain.  States they gave him a rx for "percocet 325" but only for a few days.  Pt states he has not gotten this filled yet because he doesn't have the money and will not be able to get it filled until Nov 3rd.  He would like to know if SN would also give him a rx for percocet to last longer until he can get an appt with Dr. Farris Has who he sees for back problems/pain.  [employer].  Dr. Kriste Basque, pls advise.  Thanks!

## 2011-02-25 NOTE — Telephone Encounter (Signed)
Called, spoke with pt.  I informed him Dr. Kriste Basque states he needs to get an appt with Dr. Farris Has ASAP.  Pt states he will not be able to do this until after Nov 3.  I advised pt he should go ahead and call Dr. Blenda Bridegroom office to schedule an OV after Nov 3 so he will not be waiting longer.  He verbalized understanding of this.  He is aware SN will not write a rx for percocet.  He would like a rx for vicodin.  Dr. Kriste Basque, pls advise on how you would like the vicodin rx given.  Thanks!

## 2011-02-25 NOTE — Telephone Encounter (Signed)
Spoke with pt and notified of recs per TP. He states will fill his rx for percocet and keep appt with Dr. Farris Has for additional rx.

## 2011-02-25 NOTE — Telephone Encounter (Signed)
Per SN---he will need to get an appt with Dr. Farris Has ASAP---SN does not write for percocet--just vicodin.  Does he want the vicodin?

## 2011-03-15 ENCOUNTER — Emergency Department (HOSPITAL_COMMUNITY)
Admission: EM | Admit: 2011-03-15 | Discharge: 2011-03-15 | Disposition: A | Discharge status: Home / Self Care | Payer: Medicare Other | Attending: Emergency Medicine | Admitting: Emergency Medicine

## 2011-03-15 ENCOUNTER — Encounter (HOSPITAL_COMMUNITY): Payer: Self-pay | Admitting: Emergency Medicine

## 2011-03-15 ENCOUNTER — Emergency Department (HOSPITAL_COMMUNITY): Payer: Medicare Other

## 2011-03-15 DIAGNOSIS — K589 Irritable bowel syndrome without diarrhea: Secondary | ICD-10-CM | POA: Insufficient documentation

## 2011-03-15 DIAGNOSIS — M719 Bursopathy, unspecified: Secondary | ICD-10-CM

## 2011-03-15 DIAGNOSIS — I10 Essential (primary) hypertension: Secondary | ICD-10-CM | POA: Insufficient documentation

## 2011-03-15 DIAGNOSIS — Z79899 Other long term (current) drug therapy: Secondary | ICD-10-CM | POA: Insufficient documentation

## 2011-03-15 DIAGNOSIS — M25519 Pain in unspecified shoulder: Secondary | ICD-10-CM | POA: Insufficient documentation

## 2011-03-15 DIAGNOSIS — I2510 Atherosclerotic heart disease of native coronary artery without angina pectoris: Secondary | ICD-10-CM | POA: Insufficient documentation

## 2011-03-15 DIAGNOSIS — Z7982 Long term (current) use of aspirin: Secondary | ICD-10-CM | POA: Insufficient documentation

## 2011-03-15 DIAGNOSIS — F17200 Nicotine dependence, unspecified, uncomplicated: Secondary | ICD-10-CM | POA: Insufficient documentation

## 2011-03-15 MED ORDER — OXYCODONE-ACETAMINOPHEN 5-325 MG PO TABS
ORAL_TABLET | ORAL | Status: AC
  Filled 2011-03-15: qty 1

## 2011-03-15 MED ORDER — NAPROXEN 375 MG PO TABS: 375.0000 mg | ORAL_TABLET | Freq: Two times a day (BID) | ORAL | Status: DC

## 2011-03-15 MED ORDER — OXYCODONE-ACETAMINOPHEN 5-325 MG PO TABS
1.0000 | ORAL_TABLET | Freq: Once | ORAL | Status: AC
  Administered 2011-03-15: 1 via ORAL

## 2011-03-15 MED ORDER — OXYCODONE-ACETAMINOPHEN 5-325 MG PO TABS: 1.0000 | ORAL_TABLET | ORAL | Status: AC | PRN

## 2011-03-15 NOTE — ED Notes (Signed)
PT. REPORTS PERSISTENT RIGHT SHOULDER PAIN x 2 MONTHS , DENIES INJURY OR FALL , STATES "I SOMETIMES SLEEP ON IT" .

## 2011-03-15 NOTE — ED Provider Notes (Signed)
History     CSN: [id number] Arrival date & time: 03/15/2011  3:31 AM   First MD Initiated Contact with Patient 03/15/11 0405      Chief Complaint  Patient presents with  . Shoulder Pain    (Consider location/radiation/quality/duration/timing/severity/associated sxs/prior treatment) Patient is a 65 y.o. male presenting with shoulder pain. The history is provided by the patient. No language interpreter was used.  Shoulder Pain The current episode started more than 1 week ago. The problem occurs constantly. The problem has not changed since onset.Pertinent negatives include no chest pain, no abdominal pain, no headaches and no shortness of breath. Exacerbated by: movement of the shoulder and humerus and palpation of the shoulder and humerus. The symptoms are relieved by nothing. He has tried nothing for the symptoms. The treatment provided no relief.    Past Medical History  Diagnosis Date  . Hypertension   . Acute asthmatic bronchitis   . Cigarette smoker   . Coronary artery disease   . IBS (irritable bowel syndrome)   . Benign prostatic hypertrophy   . DJD (degenerative joint disease)   . Neck pain   . Back pain   . Psychiatric disorder   . Alcohol abuse     Past Surgical History  Procedure Date  . Cystoscopy 1999    Dr. [name]    No family history on file.  History  Substance Use Topics  . Smoking status: Current Everyday Smoker  . Smokeless tobacco: Not on file  . Alcohol Use: Yes      Review of Systems  Constitutional: Negative for fever, activity change and appetite change.  HENT: Negative for facial swelling.   Eyes: Negative for discharge.  Respiratory: Negative for shortness of breath, wheezing and stridor.   Cardiovascular: Negative for chest pain and palpitations.  Gastrointestinal: Negative for abdominal pain and abdominal distention.  Genitourinary: Negative for difficulty urinating.  Musculoskeletal: Positive for arthralgias. Negative for joint  swelling.  Neurological: Negative for headaches.  Hematological: Negative.   Psychiatric/Behavioral: Negative.     Allergies  Review of patient's allergies indicates no known allergies.  Home Medications   Current Outpatient Rx  Name Route Sig Dispense Refill  . ASPIRIN 81 MG PO TABS Oral Take 81 mg by mouth daily.      . OMEGA-3 FATTY ACIDS 1000 MG PO CAPS Oral Take 2 g by mouth daily.      [name] LISINOPRIL 5 MG PO TABS Oral Take 1 tablet (5 mg total) by mouth daily. 30 tablet 5  . MULTIVITAMIN & MINERAL PO Oral Take 1 tablet by mouth daily.      [name] NITROGLYCERIN 0.4 MG SL SUBL Sublingual Place 0.4 mg under the tongue every 5 (five) minutes as needed.      [name] TRAMADOL HCL 50 MG PO TABS Oral Take 1 tablet (50 mg total) by mouth 3 (three) times daily as needed. As needed for pain 90 tablet 5    BP 143/89  Pulse 97  Temp(Src) 98.3 F (36.8 C) (Oral)  Resp 17  SpO2 100%  Physical Exam  Constitutional: He is oriented to person, place, and time. He appears well-developed and well-nourished. No distress.  HENT:  Head: Normocephalic and atraumatic.  Eyes: EOM are normal. Pupils are equal, round, and reactive to light. Right eye exhibits no discharge. Left eye exhibits no discharge.  Neck: Normal range of motion. Neck supple.  Cardiovascular: Normal rate and regular rhythm.   No murmur heard. Pulmonary/Chest: Effort normal and breath  sounds normal.  Abdominal: Soft. Bowel sounds are normal. There is no tenderness.  Musculoskeletal: He exhibits no edema.       Negative NEER test of the right shoulder intact biceps tendon 5/5 RUE strength, no deformity, tenderness to palpation of the right shoulder and humerus  Neurological: He is alert and oriented to person, place, and time. He has normal reflexes.  Skin: Skin is warm and dry. No erythema.  Psychiatric: He has a normal mood and affect.    ED Course  Procedures (including critical care time)  Labs Reviewed - No data to display No  results found.   No diagnosis found.    MDM  Follow up with your family doctor and if symptoms persist, orthopedics.  Return for any concerns        Aliviyah Malanga K Annmargaret Decaprio-Rasch, MD 03/15/11 [phone]

## 2011-03-15 NOTE — ED Notes (Signed)
Alert and oriented x4. resp even unlabored. Skin w/d. C/O right shoulder pain x2 months. No other complains

## 2011-05-21 ENCOUNTER — Telehealth: Payer: Self-pay | Admitting: Pulmonary Disease

## 2011-05-21 ENCOUNTER — Ambulatory Visit: Payer: Medicare Other | Admitting: Pulmonary Disease

## 2011-05-21 NOTE — Telephone Encounter (Signed)
Per SN---SN unable to fill pain pills along with the tramadol.  He will need to discuss with his ortho doctor or pain clinic doctor.  thanks

## 2011-05-21 NOTE — Telephone Encounter (Signed)
Called and spoke with pt. He states having pain in his arm and shoulder. States that this is ongoing pain he relates to his arthritis. Pt states that pain has been worse for the past 2 days and is requesting a refill on oxycodone. He states that SN has filled this for him before, but I do not see this on the med list. Please advise, thanks! No Known Allergies

## 2011-05-21 NOTE — Telephone Encounter (Signed)
Spoke with pt and notified of recs per SN. Pt verbalized understanding and states nothing further needed.  

## 2011-06-01 ENCOUNTER — Encounter (HOSPITAL_COMMUNITY): Payer: Self-pay | Admitting: *Deleted

## 2011-06-01 ENCOUNTER — Other Ambulatory Visit: Payer: Self-pay

## 2011-06-01 ENCOUNTER — Emergency Department (HOSPITAL_COMMUNITY)
Admission: EM | Admit: 2011-06-01 | Discharge: 2011-06-01 | Disposition: A | Discharge status: Home / Self Care | Payer: Medicare Other | Attending: Emergency Medicine | Admitting: Emergency Medicine

## 2011-06-01 DIAGNOSIS — M25519 Pain in unspecified shoulder: Secondary | ICD-10-CM | POA: Insufficient documentation

## 2011-06-01 DIAGNOSIS — I2510 Atherosclerotic heart disease of native coronary artery without angina pectoris: Secondary | ICD-10-CM | POA: Insufficient documentation

## 2011-06-01 DIAGNOSIS — Z79899 Other long term (current) drug therapy: Secondary | ICD-10-CM | POA: Insufficient documentation

## 2011-06-01 DIAGNOSIS — I10 Essential (primary) hypertension: Secondary | ICD-10-CM | POA: Insufficient documentation

## 2011-06-01 MED ORDER — OXYCODONE-ACETAMINOPHEN 5-325 MG PO TABS: 1.0000 | ORAL_TABLET | Freq: Four times a day (QID) | ORAL | Status: AC | PRN
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
## 2011-06-01 NOTE — ED Notes (Signed)
Pt from home via [facility] c/o bilateral shoulder pain for 2 weeks, denies injury per [facility] report.

## 2011-06-02 NOTE — ED Provider Notes (Signed)
Medical screening examination/treatment/procedure(s) were performed by non-physician practitioner and as supervising physician I was immediately available for consultation/collaboration.   Elfida Shimada A Zauria Dombek, MD 06/02/11 0814 

## 2011-07-04 ENCOUNTER — Ambulatory Visit: Payer: Medicare Other | Admitting: Pulmonary Disease

## 2011-07-04 ENCOUNTER — Other Ambulatory Visit: Payer: Self-pay

## 2011-07-04 ENCOUNTER — Encounter (HOSPITAL_COMMUNITY): Payer: Self-pay | Admitting: Emergency Medicine

## 2011-07-04 ENCOUNTER — Emergency Department (HOSPITAL_COMMUNITY)
Admission: EM | Admit: 2011-07-04 | Discharge: 2011-07-04 | Disposition: A | Discharge status: Home / Self Care | Payer: Medicare Other | Attending: Emergency Medicine | Admitting: Emergency Medicine

## 2011-07-04 DIAGNOSIS — M791 Myalgia, unspecified site: Secondary | ICD-10-CM

## 2011-07-04 DIAGNOSIS — IMO0001 Reserved for inherently not codable concepts without codable children: Secondary | ICD-10-CM | POA: Insufficient documentation

## 2011-07-04 DIAGNOSIS — I2510 Atherosclerotic heart disease of native coronary artery without angina pectoris: Secondary | ICD-10-CM | POA: Insufficient documentation

## 2011-07-04 DIAGNOSIS — M79609 Pain in unspecified limb: Secondary | ICD-10-CM | POA: Insufficient documentation

## 2011-07-04 DIAGNOSIS — I10 Essential (primary) hypertension: Secondary | ICD-10-CM | POA: Insufficient documentation

## 2011-07-04 DIAGNOSIS — K589 Irritable bowel syndrome without diarrhea: Secondary | ICD-10-CM | POA: Insufficient documentation

## 2011-07-04 LAB — POCT I-STAT, CHEM 8
BUN: 8 mg/dL (ref 6–23)
Calcium, Ion: 1.3 mmol/L (ref 1.12–1.32)
Creatinine, Ser: 1.1 mg/dL (ref 0.50–1.35)
Glucose, Bld: 131 mg/dL — ABNORMAL HIGH (ref 70–99)
TCO2: 28 mmol/L (ref 0–100)

## 2011-07-04 MED ORDER — OXYCODONE-ACETAMINOPHEN 5-325 MG PO TABS
2.0000 | ORAL_TABLET | Freq: Once | ORAL | Status: AC
  Administered 2011-07-04: 2 via ORAL
  Filled 2011-07-04: qty 2

## 2011-07-04 MED ORDER — OXYCODONE-ACETAMINOPHEN 5-325 MG PO TABS: 2.0000 | ORAL_TABLET | ORAL | Status: AC | PRN
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
## 2011-07-04 NOTE — ED Notes (Signed)
Both arms hurting x  A month or 2 and has appointment on3/5 but needs pain meds no injuries

## 2011-07-09 ENCOUNTER — Encounter: Payer: Self-pay | Admitting: Adult Health

## 2011-07-09 ENCOUNTER — Ambulatory Visit (INDEPENDENT_AMBULATORY_CARE_PROVIDER_SITE_OTHER): Payer: Medicare Other | Admitting: Adult Health

## 2011-07-09 VITALS — BP 130/92 | HR 79 | Temp 97.4°F | Ht 74.0 in | Wt 176.6 lb

## 2011-07-09 DIAGNOSIS — M1990 Unspecified osteoarthritis, unspecified site: Secondary | ICD-10-CM

## 2011-07-09 DIAGNOSIS — M25519 Pain in unspecified shoulder: Secondary | ICD-10-CM

## 2011-07-09 MED ORDER — MELOXICAM 15 MG PO TABS: 15.0000 mg | ORAL_TABLET | Freq: Every day | ORAL | Status: DC | PRN

## 2011-07-09 NOTE — Patient Instructions (Addendum)
Alternate ice and heat to shoulder and neck  Begin Mobic 15mg  daily with food As needed  Joint pain  Will refer you back to Orthopedics for evaluation of shoulder pain.  Please contact office for sooner follow up if symptoms do not improve or worsen or seek emergency care

## 2011-07-09 NOTE — Assessment & Plan Note (Signed)
Flare   Plan;  Alternate ice and heat to shoulder and neck  Begin Mobic 15mg  daily with food As needed  Joint pain  Will refer you back to Orthopedics for evaluation of shoulder pain.  Please contact office for sooner follow up if symptoms do not improve or worsen or seek emergency care
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
## 2011-08-06 ENCOUNTER — Emergency Department (HOSPITAL_COMMUNITY)
Admission: EM | Admit: 2011-08-06 | Discharge: 2011-08-06 | Disposition: A | Discharge status: Home / Self Care | Payer: Medicare Other | Attending: Emergency Medicine | Admitting: Emergency Medicine

## 2011-08-06 ENCOUNTER — Ambulatory Visit: Payer: Medicare Other | Admitting: Pulmonary Disease

## 2011-08-06 ENCOUNTER — Encounter (HOSPITAL_COMMUNITY): Payer: Self-pay | Admitting: *Deleted

## 2011-08-06 DIAGNOSIS — G8929 Other chronic pain: Secondary | ICD-10-CM | POA: Insufficient documentation

## 2011-08-06 DIAGNOSIS — F17200 Nicotine dependence, unspecified, uncomplicated: Secondary | ICD-10-CM | POA: Insufficient documentation

## 2011-08-06 DIAGNOSIS — M542 Cervicalgia: Secondary | ICD-10-CM

## 2011-08-06 DIAGNOSIS — M25519 Pain in unspecified shoulder: Secondary | ICD-10-CM | POA: Insufficient documentation

## 2011-08-06 DIAGNOSIS — I10 Essential (primary) hypertension: Secondary | ICD-10-CM | POA: Insufficient documentation

## 2011-08-06 MED ORDER — METHOCARBAMOL 500 MG PO TABS: 500.0000 mg | ORAL_TABLET | Freq: Two times a day (BID) | ORAL | Status: DC

## 2011-08-06 MED ORDER — KETOROLAC TROMETHAMINE 60 MG/2ML IM SOLN
30.0000 mg | Freq: Once | INTRAMUSCULAR | Status: AC
  Administered 2011-08-06: 60 mg via INTRAMUSCULAR
  Filled 2011-08-06: qty 2

## 2011-08-06 MED ORDER — TRAMADOL HCL 50 MG PO TABS: 50.0000 mg | ORAL_TABLET | Freq: Four times a day (QID) | ORAL | Status: DC | PRN

## 2011-08-06 MED ORDER — DEXAMETHASONE SODIUM PHOSPHATE 10 MG/ML IJ SOLN
10.0000 mg | Freq: Once | INTRAMUSCULAR | Status: AC
  Administered 2011-08-06: 10 mg via INTRAMUSCULAR
  Filled 2011-08-06: qty 1
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
## 2011-08-06 NOTE — ED Notes (Signed)
Pt states that he has rt sided back and neck pain x several months and that some times pain goes over to left side states hurts to move his neck

## 2011-08-06 NOTE — Discharge Instructions (Signed)

## 2011-08-06 NOTE — ED Notes (Signed)
Pt reports chronic bilateral neck pain, reports discomfort radiates down to left arm. Pt also has lower back pain. No neuro deficits.

## 2011-08-10 NOTE — ED Provider Notes (Signed)
Medical screening examination/treatment/procedure(s) were performed by non-physician practitioner and as supervising physician I was immediately available for consultation/collaboration.   Octave Montrose A Darinda [place], MD 08/10/11 1448 

## 2011-08-15 ENCOUNTER — Emergency Department (HOSPITAL_COMMUNITY)
Admission: EM | Admit: 2011-08-15 | Discharge: 2011-08-15 | Disposition: A | Discharge status: Home / Self Care | Payer: Medicare Other | Attending: Emergency Medicine | Admitting: Emergency Medicine

## 2011-08-15 ENCOUNTER — Encounter (HOSPITAL_COMMUNITY): Payer: Self-pay | Admitting: Nurse Practitioner

## 2011-08-15 DIAGNOSIS — M1990 Unspecified osteoarthritis, unspecified site: Secondary | ICD-10-CM | POA: Insufficient documentation

## 2011-08-15 DIAGNOSIS — F17200 Nicotine dependence, unspecified, uncomplicated: Secondary | ICD-10-CM | POA: Insufficient documentation

## 2011-08-15 DIAGNOSIS — N400 Enlarged prostate without lower urinary tract symptoms: Secondary | ICD-10-CM | POA: Insufficient documentation

## 2011-08-15 DIAGNOSIS — I2510 Atherosclerotic heart disease of native coronary artery without angina pectoris: Secondary | ICD-10-CM | POA: Insufficient documentation

## 2011-08-15 DIAGNOSIS — M545 Low back pain, unspecified: Secondary | ICD-10-CM | POA: Insufficient documentation

## 2011-08-15 DIAGNOSIS — I10 Essential (primary) hypertension: Secondary | ICD-10-CM | POA: Insufficient documentation

## 2011-08-15 DIAGNOSIS — G8929 Other chronic pain: Secondary | ICD-10-CM | POA: Insufficient documentation

## 2011-08-15 DIAGNOSIS — M542 Cervicalgia: Secondary | ICD-10-CM

## 2011-08-15 MED ORDER — OXYCODONE-ACETAMINOPHEN 5-325 MG PO TABS: 1.0000 | ORAL_TABLET | ORAL | Status: AC | PRN

## 2011-08-15 MED ORDER — OXYCODONE-ACETAMINOPHEN 5-325 MG PO TABS
1.0000 | ORAL_TABLET | Freq: Once | ORAL | Status: AC
  Administered 2011-08-15: 1 via ORAL
  Filled 2011-08-15: qty 1

## 2011-08-15 MED ORDER — MELOXICAM 7.5 MG PO TABS: 7.5000 mg | ORAL_TABLET | Freq: Every day | ORAL | Status: DC

## 2011-08-15 NOTE — ED Notes (Signed)
C/o neck pain x 3-4 weeks. "Think its my rheumatoid arthritis." states he was seen here last week and given robaxin and ultram prescriptions but the pharmacy would not fill them due to him not having the actual prescription with him. Ambulattory, mae

## 2011-08-15 NOTE — ED Provider Notes (Signed)
History     CSN: [id number]  Arrival date & time 08/15/11  1322   First MD Initiated Contact with Patient 08/15/11 1403      Chief Complaint  Patient presents with  . Neck Pain    (Consider location/radiation/quality/duration/timing/severity/associated sxs/prior treatment) HPI History provided by pt.   Pt reports that he has chronic pain bilateral posterior neck that radiates down both arms and is associated w/ intermittent paresthesias of all fingers.  Pain has acutely worsened over the past 2-3 weeks and he attributes to being out of his percocet for the past two months.  Has not tried anything else for pain.  Denies recent trauma.  Denies CP/SOB.  Also c/o typical, non-radiating, chronic low back pain.  Per prior chart, pt seen for same sx on 08/09/11.  D/c'd home w/ prescriptions for robaxin and ultram.  Pt reports that pharmacy did not receive prescriptions and would not fill w/out hard copy.    Past Medical History  Diagnosis Date  . Hypertension   . Acute asthmatic bronchitis   . Cigarette smoker   . Coronary artery disease   . IBS (irritable bowel syndrome)   . Benign prostatic hypertrophy   . DJD (degenerative joint disease)   . Neck pain   . Back pain   . Psychiatric disorder   . Alcohol abuse     Past Surgical History  Procedure Date  . Cystoscopy 1999    Dr. [name]    History reviewed. No pertinent family history.  History  Substance Use Topics  . Smoking status: Current Everyday Smoker -- 0.3 packs/day for 40 years    Types: Cigarettes  . Smokeless tobacco: Never Used  . Alcohol Use: Yes      Review of Systems  All other systems reviewed and are negative.    Allergies  Review of patient's allergies indicates no known allergies.  Home Medications   Current Outpatient Rx  Name Route Sig Dispense Refill  . ASPIRIN EC 81 MG PO TBEC Oral Take 81 mg by mouth daily.    [name] VICKS VAPORUB 4.73-1.2-2.6 % EX OINT Apply externally Apply 1 application  topically daily as needed. For congestion    . FERROUS SULFATE 325 (65 FE) MG PO TABS Oral Take 325 mg by mouth daily with breakfast.    . OMEGA-3 FATTY ACIDS 1000 MG PO CAPS Oral Take 1 g by mouth daily.     [name] LISINOPRIL 5 MG PO TABS Oral Take 5 mg by mouth daily.    [name] NITROGLYCERIN 0.4 MG SL SUBL Sublingual Place 0.4 mg under the tongue every 5 (five) minutes as needed. For chest pain    . VITAMIN C 500 MG PO TABS Oral Take 500 mg by mouth daily.    . MELOXICAM 7.5 MG PO TABS Oral Take 1 tablet (7.5 mg total) by mouth daily. 30 tablet 0  . OXYCODONE-ACETAMINOPHEN 5-325 MG PO TABS Oral Take 1 tablet by mouth every 4 (four) hours as needed for pain. 20 tablet 0    There were no vitals taken for this visit.  Physical Exam  Nursing note and vitals reviewed. Constitutional: He is oriented to person, place, and time. He appears well-developed and well-nourished. No distress.  HENT:  Head: Normocephalic and atraumatic.  Eyes:       Normal appearance  Neck: Normal range of motion.  Cardiovascular: Normal rate and regular rhythm.   Pulmonary/Chest: Effort normal and breath sounds normal.  Musculoskeletal:  Mild tenderness at approx C5-C6 as well as lumbar spine.  RIght trap and bilateral lumbar paraspinal tenderness as well.  No CVA tenderness.  NS intact in all four extremities, 5/5 strength and full active ROM.  No saddle anesthesia, nml patellar reflexes.    Neurological: He is alert and oriented to person, place, and time.  Skin: Skin is warm and dry. No rash noted.  Psychiatric: He has a normal mood and affect. His behavior is normal.    ED Course  Procedures (including critical care time)  Labs Reviewed - No data to display No results found.   1. Chronic neck pain   2. Chronic low back pain       MDM  Pt presents w/ non-traumatic, acute on chronic neck and back pain.  Afebrile, NAD and strength, ROM and NV intact in all four extremities.  Pt d/c'd home w/ short course  of percocet for pain as well as referral to ortho.  Return precautions discussed.         Arie Sabina Bassett, [place] 08/15/11 2121

## 2011-08-15 NOTE — ED Notes (Signed)
Pt with chronic neck pain requesting medication refill.

## 2011-08-15 NOTE — Discharge Instructions (Signed)
Take percocet as needed for severe pain.  Do not drive within four hours of taking this medication (may cause drowsiness or confusion).  Take mobic as prescribed.  Follow up with your family doctor if you run out of pain medication.  Try to get in with the pain specialist as well as the orthopedic physician you have been referred to.  You should return to the ER if you develop fever, arm/leg weakness or loss of control of bladder/bowels.

## 2011-08-17 NOTE — ED Provider Notes (Signed)
Medical screening examination/treatment/procedure(s) were performed by non-physician practitioner and as supervising physician I was immediately available for consultation/collaboration.  Geoffery Lyons, MD 08/17/11 [phone]

## 2011-09-18 ENCOUNTER — Other Ambulatory Visit (INDEPENDENT_AMBULATORY_CARE_PROVIDER_SITE_OTHER): Payer: Medicare Other

## 2011-09-18 ENCOUNTER — Ambulatory Visit (INDEPENDENT_AMBULATORY_CARE_PROVIDER_SITE_OTHER): Payer: Medicare Other | Admitting: Pulmonary Disease

## 2011-09-18 ENCOUNTER — Encounter: Payer: Self-pay | Admitting: Pulmonary Disease

## 2011-09-18 VITALS — BP 122/88 | HR 60 | Temp 97.0°F | Ht 74.0 in | Wt 172.0 lb

## 2011-09-18 DIAGNOSIS — J209 Acute bronchitis, unspecified: Secondary | ICD-10-CM

## 2011-09-18 DIAGNOSIS — M542 Cervicalgia: Secondary | ICD-10-CM

## 2011-09-18 DIAGNOSIS — Z8719 Personal history of other diseases of the digestive system: Secondary | ICD-10-CM

## 2011-09-18 DIAGNOSIS — M1990 Unspecified osteoarthritis, unspecified site: Secondary | ICD-10-CM

## 2011-09-18 DIAGNOSIS — Z87898 Personal history of other specified conditions: Secondary | ICD-10-CM

## 2011-09-18 DIAGNOSIS — F17200 Nicotine dependence, unspecified, uncomplicated: Secondary | ICD-10-CM

## 2011-09-18 DIAGNOSIS — E119 Type 2 diabetes mellitus without complications: Secondary | ICD-10-CM

## 2011-09-18 DIAGNOSIS — F411 Generalized anxiety disorder: Secondary | ICD-10-CM

## 2011-09-18 DIAGNOSIS — I10 Essential (primary) hypertension: Secondary | ICD-10-CM

## 2011-09-18 DIAGNOSIS — I2510 Atherosclerotic heart disease of native coronary artery without angina pectoris: Secondary | ICD-10-CM

## 2011-09-18 DIAGNOSIS — F419 Anxiety disorder, unspecified: Secondary | ICD-10-CM

## 2011-09-18 LAB — CBC WITH DIFFERENTIAL/PLATELET
Basophils Relative: 1 % (ref 0.0–3.0)
Eosinophils Absolute: 0.1 10*3/uL (ref 0.0–0.7)
HCT: 43.8 % (ref 39.0–52.0)
Lymphs Abs: 1.8 10*3/uL (ref 0.7–4.0)
MCHC: 33.3 g/dL (ref 30.0–36.0)
MCV: 92.8 fl (ref 78.0–100.0)
Monocytes Absolute: 0.3 10*3/uL (ref 0.1–1.0)
Neutrophils Relative %: 46.9 % (ref 43.0–77.0)
RBC: 4.72 Mil/uL (ref 4.22–5.81)

## 2011-09-18 LAB — LIPID PANEL
Cholesterol: 149 mg/dL (ref 0–200)
LDL Cholesterol: 66 mg/dL (ref 0–99)
Total CHOL/HDL Ratio: 2
Triglycerides: 45 mg/dL (ref 0.0–149.0)
VLDL: 9 mg/dL (ref 0.0–40.0)

## 2011-09-18 LAB — HEMOGLOBIN A1C: Hgb A1c MFr Bld: 5.4 % (ref 4.6–6.5)

## 2011-09-18 LAB — HEPATIC FUNCTION PANEL
Bilirubin, Direct: 0.1 mg/dL (ref 0.0–0.3)
Total Bilirubin: 0.6 mg/dL (ref 0.3–1.2)

## 2011-09-18 LAB — BASIC METABOLIC PANEL
BUN: 9 mg/dL (ref 6–23)
Creatinine, Ser: 1 mg/dL (ref 0.4–1.5)
GFR: 94 mL/min (ref >60.00)

## 2011-09-18 MED ORDER — TRAMADOL HCL 50 MG PO TABS: 50.0000 mg | ORAL_TABLET | Freq: Three times a day (TID) | ORAL | Status: DC | PRN
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
## 2011-09-18 NOTE — Patient Instructions (Addendum)
Today we updated your med list in our EPIC system...    Continue your current medications the same...  Today we did your follow up blood work...    We will call you w/ the results when avail...  We decided to write for a new pain pill= TRAMADOL 50mg  - take one tab up to three times daily for pain (may take w/ Tylenol)...  If your neck/ shoulders are giving you more trouble then please follow up w/ [name], Ortho for further evaluation...  Try to cut out the last of that smoking & drinking...  Call for any questions...  Let's plan a follow up visit in 6 months.Marland KitchenMarland Kitchen

## 2011-09-20 ENCOUNTER — Ambulatory Visit: Payer: Medicare Other | Admitting: Pulmonary Disease

## 2011-11-28 ENCOUNTER — Telehealth: Payer: Self-pay | Admitting: Pulmonary Disease

## 2011-11-28 NOTE — Telephone Encounter (Signed)
lmtcb with medications he is in need of .

## 2011-11-29 MED ORDER — LISINOPRIL 5 MG PO TABS: 5.0000 mg | ORAL_TABLET | Freq: Every day | ORAL | Status: DC

## 2011-11-29 NOTE — Telephone Encounter (Signed)
I spoke with the pt and he states that he needs a refill on lisinopril and oxycodone because the tramadol that was prescribed at last OV is not working. I advised the pt that I will send refill for lisinopril but per last OV note Dr. Kriste Basque advised the following for his pain meds:  We decided to write for a new pain pill= TRAMADOL 50mg  - take one tab up to three times daily for pain (may take w/ Tylenol)...  If your neck/ shoulders are giving you more trouble then please follow up w/ [name], Ortho for further evaluation... Pt states he will  Call his ortho. Carron Curie, CMA

## 2012-02-06 ENCOUNTER — Telehealth: Payer: Self-pay | Admitting: Pulmonary Disease

## 2012-02-06 NOTE — Telephone Encounter (Signed)
Called and spoke with lauren and she will refax this request per the pt.

## 2012-02-07 ENCOUNTER — Telehealth: Payer: Self-pay | Admitting: Pulmonary Disease

## 2012-02-07 NOTE — Telephone Encounter (Signed)
Form was received and signed by SN.

## 2012-02-07 NOTE — Telephone Encounter (Signed)
Leigh do you have a fax for this patient.Carron Curie, CMA

## 2012-02-07 NOTE — Telephone Encounter (Signed)
Encounter closed by accident. Will forward to Leigh. Carron Curie, CMA

## 2012-03-10 ENCOUNTER — Telehealth: Payer: Self-pay | Admitting: Pulmonary Disease

## 2012-03-10 NOTE — Telephone Encounter (Signed)
No forms have been received. °

## 2012-03-11 NOTE — Telephone Encounter (Signed)
Spoke with Joni Reining and she is refaxing the paperwork so I can pass along to [employer].

## 2012-03-18 NOTE — Telephone Encounter (Signed)
SN is not able to sign this form.  This will need to be signed by his ortho doctor or chronic pain management  Specialist. Form has been faxed back to [employer].

## 2012-03-18 NOTE — Telephone Encounter (Signed)
Spoke with Leigh. Dr Kriste Basque has paperwork.  Will wait for him to complete.

## 2012-03-20 ENCOUNTER — Ambulatory Visit: Payer: Medicare Other | Admitting: Pulmonary Disease

## 2012-03-26 ENCOUNTER — Ambulatory Visit: Payer: Medicare Other | Admitting: Pulmonary Disease

## 2012-04-27 ENCOUNTER — Ambulatory Visit (INDEPENDENT_AMBULATORY_CARE_PROVIDER_SITE_OTHER): Payer: Medicare Other | Admitting: Pulmonary Disease

## 2012-04-27 ENCOUNTER — Encounter: Payer: Self-pay | Admitting: Pulmonary Disease

## 2012-04-27 VITALS — BP 160/88 | HR 68 | Temp 98.4°F | Ht 74.0 in | Wt 173.8 lb

## 2012-04-27 DIAGNOSIS — M1990 Unspecified osteoarthritis, unspecified site: Secondary | ICD-10-CM

## 2012-04-27 DIAGNOSIS — F17200 Nicotine dependence, unspecified, uncomplicated: Secondary | ICD-10-CM

## 2012-04-27 DIAGNOSIS — I10 Essential (primary) hypertension: Secondary | ICD-10-CM

## 2012-04-27 DIAGNOSIS — J438 Other emphysema: Secondary | ICD-10-CM

## 2012-04-27 DIAGNOSIS — Z8719 Personal history of other diseases of the digestive system: Secondary | ICD-10-CM

## 2012-04-27 DIAGNOSIS — Z87898 Personal history of other specified conditions: Secondary | ICD-10-CM

## 2012-04-27 DIAGNOSIS — J439 Emphysema, unspecified: Secondary | ICD-10-CM

## 2012-04-27 DIAGNOSIS — Z23 Encounter for immunization: Secondary | ICD-10-CM

## 2012-04-27 DIAGNOSIS — I2510 Atherosclerotic heart disease of native coronary artery without angina pectoris: Secondary | ICD-10-CM

## 2012-04-27 MED ORDER — LISINOPRIL 5 MG PO TABS: 5.0000 mg | ORAL_TABLET | Freq: Every day | ORAL | Status: DC
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
## 2012-04-27 NOTE — Patient Instructions (Addendum)
Today we updated your med list in our EPIC system...    Continue your current medications the same...  We refilled Lisinopril 10mg  - take one tab daily...  We gave yu the 2013 flu vaccine today...  Call for any questions...  Let's plan a follow up visit in 6 months w/ FASTING blood work at that time.Steven KitchenMarland Lowery

## 2012-04-29 ENCOUNTER — Encounter (HOSPITAL_COMMUNITY): Payer: Self-pay

## 2012-04-29 ENCOUNTER — Emergency Department (HOSPITAL_COMMUNITY)
Admission: EM | Admit: 2012-04-29 | Discharge: 2012-04-29 | Disposition: A | Discharge status: Home / Self Care | Payer: Medicare Other | Attending: Emergency Medicine | Admitting: Emergency Medicine

## 2012-04-29 ENCOUNTER — Emergency Department (HOSPITAL_COMMUNITY): Payer: Medicare Other

## 2012-04-29 DIAGNOSIS — Z7982 Long term (current) use of aspirin: Secondary | ICD-10-CM | POA: Insufficient documentation

## 2012-04-29 DIAGNOSIS — I2510 Atherosclerotic heart disease of native coronary artery without angina pectoris: Secondary | ICD-10-CM | POA: Insufficient documentation

## 2012-04-29 DIAGNOSIS — F17200 Nicotine dependence, unspecified, uncomplicated: Secondary | ICD-10-CM | POA: Insufficient documentation

## 2012-04-29 DIAGNOSIS — R079 Chest pain, unspecified: Secondary | ICD-10-CM | POA: Insufficient documentation

## 2012-04-29 DIAGNOSIS — Z87448 Personal history of other diseases of urinary system: Secondary | ICD-10-CM | POA: Insufficient documentation

## 2012-04-29 DIAGNOSIS — Z8719 Personal history of other diseases of the digestive system: Secondary | ICD-10-CM | POA: Insufficient documentation

## 2012-04-29 DIAGNOSIS — I10 Essential (primary) hypertension: Secondary | ICD-10-CM | POA: Insufficient documentation

## 2012-04-29 DIAGNOSIS — Z8709 Personal history of other diseases of the respiratory system: Secondary | ICD-10-CM | POA: Insufficient documentation

## 2012-04-29 DIAGNOSIS — F1010 Alcohol abuse, uncomplicated: Secondary | ICD-10-CM | POA: Insufficient documentation

## 2012-04-29 DIAGNOSIS — Z79899 Other long term (current) drug therapy: Secondary | ICD-10-CM | POA: Insufficient documentation

## 2012-04-29 DIAGNOSIS — Z8739 Personal history of other diseases of the musculoskeletal system and connective tissue: Secondary | ICD-10-CM | POA: Insufficient documentation

## 2012-04-29 DIAGNOSIS — Z8659 Personal history of other mental and behavioral disorders: Secondary | ICD-10-CM | POA: Insufficient documentation

## 2012-04-29 DIAGNOSIS — I252 Old myocardial infarction: Secondary | ICD-10-CM | POA: Insufficient documentation

## 2012-04-29 LAB — POCT I-STAT TROPONIN I: Troponin i, poc: 0 ng/mL (ref 0.00–0.08)

## 2012-04-29 LAB — BASIC METABOLIC PANEL
CO2: 27 mEq/L (ref 19–32)
Calcium: 9.1 mg/dL (ref 8.4–10.5)
Potassium: 4 mEq/L (ref 3.5–5.1)
Sodium: 139 mEq/L (ref 135–145)

## 2012-04-29 LAB — TROPONIN I: Troponin I: <0.3 ng/mL (ref <0.30)

## 2012-04-29 LAB — CBC WITH DIFFERENTIAL/PLATELET
Basophils Absolute: 0 10*3/uL (ref 0.0–0.1)
Eosinophils Relative: 1 % (ref 0–5)
Lymphocytes Relative: 31 % (ref 12–46)
Lymphs Abs: 1.6 10*3/uL (ref 0.7–4.0)
Neutro Abs: 3.4 10*3/uL (ref 1.7–7.7)
Neutrophils Relative %: 63 % (ref 43–77)
Platelets: 186 10*3/uL (ref 150–400)
RBC: 4.36 MIL/uL (ref 4.22–5.81)
RDW: 12.9 % (ref 11.5–15.5)
WBC: 5.3 10*3/uL (ref 4.0–10.5)
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
## 2012-04-29 NOTE — ED Notes (Signed)
Pt handed urinal to use

## 2012-04-29 NOTE — ED Notes (Signed)
Pt still out of the department at this time 

## 2012-04-29 NOTE — ED Notes (Signed)
Diet tray ordered 
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
## 2012-04-29 NOTE — ED Notes (Signed)
Patient transported to X-ray 

## 2012-10-23 ENCOUNTER — Ambulatory Visit: Payer: Medicare Other | Admitting: Pulmonary Disease

## 2012-10-27 ENCOUNTER — Ambulatory Visit (INDEPENDENT_AMBULATORY_CARE_PROVIDER_SITE_OTHER): Payer: Medicare Other | Admitting: Pulmonary Disease

## 2012-10-27 ENCOUNTER — Encounter: Payer: Self-pay | Admitting: Pulmonary Disease

## 2012-10-27 ENCOUNTER — Other Ambulatory Visit (INDEPENDENT_AMBULATORY_CARE_PROVIDER_SITE_OTHER): Payer: Medicare Other

## 2012-10-27 VITALS — BP 140/90 | HR 66 | Temp 98.0°F | Ht 74.0 in | Wt 180.6 lb

## 2012-10-27 DIAGNOSIS — J439 Emphysema, unspecified: Secondary | ICD-10-CM

## 2012-10-27 DIAGNOSIS — Z87898 Personal history of other specified conditions: Secondary | ICD-10-CM

## 2012-10-27 DIAGNOSIS — I2510 Atherosclerotic heart disease of native coronary artery without angina pectoris: Secondary | ICD-10-CM

## 2012-10-27 DIAGNOSIS — M549 Dorsalgia, unspecified: Secondary | ICD-10-CM

## 2012-10-27 DIAGNOSIS — F1010 Alcohol abuse, uncomplicated: Secondary | ICD-10-CM

## 2012-10-27 DIAGNOSIS — F489 Nonpsychotic mental disorder, unspecified: Secondary | ICD-10-CM

## 2012-10-27 DIAGNOSIS — M542 Cervicalgia: Secondary | ICD-10-CM

## 2012-10-27 DIAGNOSIS — F17200 Nicotine dependence, unspecified, uncomplicated: Secondary | ICD-10-CM

## 2012-10-27 DIAGNOSIS — Z8719 Personal history of other diseases of the digestive system: Secondary | ICD-10-CM

## 2012-10-27 DIAGNOSIS — M1990 Unspecified osteoarthritis, unspecified site: Secondary | ICD-10-CM

## 2012-10-27 DIAGNOSIS — I10 Essential (primary) hypertension: Secondary | ICD-10-CM

## 2012-10-27 DIAGNOSIS — J438 Other emphysema: Secondary | ICD-10-CM

## 2012-10-27 LAB — CBC WITH DIFFERENTIAL/PLATELET
Basophils Absolute: 0 10*3/uL (ref 0.0–0.1)
Eosinophils Absolute: 0.1 10*3/uL (ref 0.0–0.7)
HCT: 45.4 % (ref 39.0–52.0)
Lymphs Abs: 2 10*3/uL (ref 0.7–4.0)
MCV: 93.7 fl (ref 78.0–100.0)
Monocytes Absolute: 0.4 10*3/uL (ref 0.1–1.0)
Platelets: 231 10*3/uL (ref 150.0–400.0)
RDW: 13 % (ref 11.5–14.6)

## 2012-10-27 LAB — LIPID PANEL
Cholesterol: 169 mg/dL (ref 0–200)
HDL: 65.2 mg/dL
LDL Cholesterol: 92 mg/dL (ref 0–99)
Total CHOL/HDL Ratio: 3
Triglycerides: 57 mg/dL (ref 0.0–149.0)
VLDL: 11.4 mg/dL (ref 0.0–40.0)

## 2012-10-27 LAB — BASIC METABOLIC PANEL
Chloride: 104 mEq/L (ref 96–112)
GFR: 84.97 mL/min (ref >60.00)
Glucose, Bld: 105 mg/dL — ABNORMAL HIGH (ref 70–99)
Potassium: 4.1 mEq/L (ref 3.5–5.1)
Sodium: 140 mEq/L (ref 135–145)

## 2012-10-27 LAB — HEPATIC FUNCTION PANEL
ALT: 16 U/L (ref 0–53)
AST: 18 U/L (ref 0–37)
Albumin: 4.2 g/dL (ref 3.5–5.2)
Alkaline Phosphatase: 78 U/L (ref 39–117)
Bilirubin, Direct: 0.1 mg/dL (ref 0.0–0.3)
Total Bilirubin: 0.8 mg/dL (ref 0.3–1.2)
Total Protein: 7.8 g/dL (ref 6.0–8.3)

## 2012-10-27 LAB — TSH: TSH: 0.95 u[IU]/mL (ref 0.35–5.50)

## 2012-10-27 MED ORDER — LISINOPRIL 5 MG PO TABS: 5.0000 mg | ORAL_TABLET | Freq: Every day | ORAL | Status: DC

## 2012-10-27 MED ORDER — ETODOLAC 400 MG PO TABS: 400.0000 mg | ORAL_TABLET | Freq: Two times a day (BID) | ORAL | Status: DC | PRN

## 2012-10-27 MED ORDER — SILDENAFIL CITRATE 100 MG PO TABS: 100.0000 mg | ORAL_TABLET | Freq: Every day | ORAL | Status: DC | PRN

## 2012-10-27 NOTE — Patient Instructions (Addendum)
Today we updated your med list in our EPIC system...    Continue your current medications the same...    We refilled your LISINOPRIL, & don't forget to call us if you need any refills & don't get the answer you want from the Pharm...  We also wrote a new prescription for a new pain pill= ETODALAC 400mg - take one tab up to twice daily as needed for pain...    If needed> you can take OTC PRILOSEC 20mg /d to protect your stomach from upset by meds...  Finally we wrote for VIAGRA 100mg - try 1/2 to 1 tab as needed...    These pills are expensive & the best prices are from [facility] in [place] [phone])    They have a generic Viagra in 20mg  tabs, check it out & let us know if you want [place] to call it into them...  Today we did your follow up FASTING blood work...    We will contact you w/ the results when available...   Call for any questions...  Let's plan a follow up visit in 67mo, sooner if needed for problems.Marland KitchenMarland Kitchen
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
## 2012-11-05 ENCOUNTER — Other Ambulatory Visit: Payer: Self-pay | Admitting: Pulmonary Disease

## 2013-04-20 ENCOUNTER — Telehealth: Payer: Self-pay | Admitting: Pulmonary Disease

## 2013-04-20 NOTE — Telephone Encounter (Signed)
Error.Steven Lowery ° °

## 2013-04-28 ENCOUNTER — Ambulatory Visit: Payer: Medicare Other | Admitting: Pulmonary Disease

## 2013-06-21 ENCOUNTER — Ambulatory Visit (INDEPENDENT_AMBULATORY_CARE_PROVIDER_SITE_OTHER): Payer: Medicare HMO | Admitting: Pulmonary Disease

## 2013-06-21 ENCOUNTER — Encounter: Payer: Self-pay | Admitting: Pulmonary Disease

## 2013-06-21 ENCOUNTER — Ambulatory Visit (INDEPENDENT_AMBULATORY_CARE_PROVIDER_SITE_OTHER)
Admission: RE | Admit: 2013-06-21 | Discharge: 2013-06-21 | Disposition: A | Discharge status: Home / Self Care | Payer: Medicare HMO | Source: Ambulatory Visit | Attending: Pulmonary Disease | Admitting: Pulmonary Disease

## 2013-06-21 VITALS — BP 160/110 | HR 82 | Temp 97.4°F | Ht 74.0 in | Wt 186.0 lb

## 2013-06-21 DIAGNOSIS — J439 Emphysema, unspecified: Secondary | ICD-10-CM

## 2013-06-21 DIAGNOSIS — F1010 Alcohol abuse, uncomplicated: Secondary | ICD-10-CM

## 2013-06-21 DIAGNOSIS — M25849 Other specified joint disorders, unspecified hand: Secondary | ICD-10-CM

## 2013-06-21 DIAGNOSIS — F17200 Nicotine dependence, unspecified, uncomplicated: Secondary | ICD-10-CM

## 2013-06-21 DIAGNOSIS — J438 Other emphysema: Secondary | ICD-10-CM

## 2013-06-21 DIAGNOSIS — I10 Essential (primary) hypertension: Secondary | ICD-10-CM

## 2013-06-21 DIAGNOSIS — I2510 Atherosclerotic heart disease of native coronary artery without angina pectoris: Secondary | ICD-10-CM

## 2013-06-21 DIAGNOSIS — Z87898 Personal history of other specified conditions: Secondary | ICD-10-CM

## 2013-06-21 DIAGNOSIS — M1990 Unspecified osteoarthritis, unspecified site: Secondary | ICD-10-CM

## 2013-06-21 DIAGNOSIS — Z8719 Personal history of other diseases of the digestive system: Secondary | ICD-10-CM

## 2013-06-21 DIAGNOSIS — M25841 Other specified joint disorders, right hand: Secondary | ICD-10-CM

## 2013-06-21 DIAGNOSIS — F489 Nonpsychotic mental disorder, unspecified: Secondary | ICD-10-CM

## 2013-06-21 MED ORDER — LISINOPRIL 20 MG PO TABS: 20.0000 mg | ORAL_TABLET | Freq: Every day | ORAL | Status: DC
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
## 2013-06-21 NOTE — Patient Instructions (Addendum)
Today we updated your med list in our EPIC system...    Continue your current medications the same...  We decided to write for LISINOPRIL 20mg  tabs- take one tab daily for your BP...  We gave you the 2014 Flu vaccine today...  Be sure to eliminate salt/ Sodium from your diet, quit the smoking, and quit the last of the beer...  We checked a follow up CXR today...    We will contact you w/ the results when available...   We will arrange for an Orthopedic consult at the [facility] for the cyst on your right hand...  Monitor your BP at home & let's plan a follow up visit in 3-4 months w/ FASTING blood work at that time.Marland KitchenMarland Kitchen

## 2013-07-16 ENCOUNTER — Other Ambulatory Visit: Payer: Self-pay | Admitting: Pulmonary Disease

## 2013-07-16 DIAGNOSIS — I2510 Atherosclerotic heart disease of native coronary artery without angina pectoris: Secondary | ICD-10-CM

## 2013-07-16 DIAGNOSIS — I10 Essential (primary) hypertension: Secondary | ICD-10-CM

## 2013-09-15 ENCOUNTER — Encounter (INDEPENDENT_AMBULATORY_CARE_PROVIDER_SITE_OTHER): Payer: Self-pay

## 2013-09-15 ENCOUNTER — Ambulatory Visit (INDEPENDENT_AMBULATORY_CARE_PROVIDER_SITE_OTHER): Payer: Medicare HMO | Admitting: Pulmonary Disease

## 2013-09-15 ENCOUNTER — Encounter: Payer: Self-pay | Admitting: Pulmonary Disease

## 2013-09-15 VITALS — BP 138/84 | HR 66 | Temp 98.1°F | Ht 74.0 in | Wt 181.4 lb

## 2013-09-15 DIAGNOSIS — J438 Other emphysema: Secondary | ICD-10-CM

## 2013-09-15 DIAGNOSIS — J439 Emphysema, unspecified: Secondary | ICD-10-CM

## 2013-09-15 DIAGNOSIS — I10 Essential (primary) hypertension: Secondary | ICD-10-CM

## 2013-09-15 DIAGNOSIS — M1990 Unspecified osteoarthritis, unspecified site: Secondary | ICD-10-CM

## 2013-09-15 DIAGNOSIS — Z87898 Personal history of other specified conditions: Secondary | ICD-10-CM

## 2013-09-15 DIAGNOSIS — F17200 Nicotine dependence, unspecified, uncomplicated: Secondary | ICD-10-CM

## 2013-09-15 DIAGNOSIS — I2510 Atherosclerotic heart disease of native coronary artery without angina pectoris: Secondary | ICD-10-CM

## 2013-09-15 DIAGNOSIS — M549 Dorsalgia, unspecified: Secondary | ICD-10-CM

## 2013-09-15 DIAGNOSIS — Z8719 Personal history of other diseases of the digestive system: Secondary | ICD-10-CM

## 2013-09-15 MED ORDER — TRAMADOL HCL 50 MG PO TABS: ORAL_TABLET | ORAL | Status: DC
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
## 2013-09-15 NOTE — Patient Instructions (Signed)
Today we updated your med list in our EPIC system...    Continue your current medications the same...  We refilled your TRAMADOL for pain- take one tab up to 3 times daily as needed...    You may take this w/ an extra strength Tylenol to boost it's effect...  We will arrange for a referral to [facility] for further eval of your back pain...  Marland KitchenCall for any questions or if we can be of service in any way.Marland KitchenMarland Kitchen

## 2013-10-23 ENCOUNTER — Encounter (HOSPITAL_COMMUNITY): Payer: Self-pay | Admitting: Emergency Medicine

## 2013-10-23 ENCOUNTER — Emergency Department (HOSPITAL_COMMUNITY)
Admission: EM | Admit: 2013-10-23 | Discharge: 2013-10-23 | Disposition: A | Discharge status: Home / Self Care | Payer: Medicare HMO | Attending: Emergency Medicine | Admitting: Emergency Medicine

## 2013-10-23 DIAGNOSIS — I951 Orthostatic hypotension: Secondary | ICD-10-CM

## 2013-10-23 DIAGNOSIS — M542 Cervicalgia: Secondary | ICD-10-CM | POA: Insufficient documentation

## 2013-10-23 DIAGNOSIS — F17200 Nicotine dependence, unspecified, uncomplicated: Secondary | ICD-10-CM | POA: Insufficient documentation

## 2013-10-23 DIAGNOSIS — Z8719 Personal history of other diseases of the digestive system: Secondary | ICD-10-CM | POA: Insufficient documentation

## 2013-10-23 DIAGNOSIS — J45909 Unspecified asthma, uncomplicated: Secondary | ICD-10-CM | POA: Insufficient documentation

## 2013-10-23 DIAGNOSIS — Z8659 Personal history of other mental and behavioral disorders: Secondary | ICD-10-CM | POA: Diagnosis not present

## 2013-10-23 DIAGNOSIS — Z791 Long term (current) use of non-steroidal anti-inflammatories (NSAID): Secondary | ICD-10-CM | POA: Diagnosis not present

## 2013-10-23 DIAGNOSIS — I2510 Atherosclerotic heart disease of native coronary artery without angina pectoris: Secondary | ICD-10-CM | POA: Diagnosis not present

## 2013-10-23 DIAGNOSIS — E119 Type 2 diabetes mellitus without complications: Secondary | ICD-10-CM | POA: Diagnosis present

## 2013-10-23 DIAGNOSIS — R55 Syncope and collapse: Secondary | ICD-10-CM | POA: Diagnosis not present

## 2013-10-23 DIAGNOSIS — Z87448 Personal history of other diseases of urinary system: Secondary | ICD-10-CM | POA: Insufficient documentation

## 2013-10-23 DIAGNOSIS — Z79899 Other long term (current) drug therapy: Secondary | ICD-10-CM | POA: Insufficient documentation

## 2013-10-23 DIAGNOSIS — E860 Dehydration: Secondary | ICD-10-CM | POA: Diagnosis not present

## 2013-10-23 DIAGNOSIS — Z8739 Personal history of other diseases of the musculoskeletal system and connective tissue: Secondary | ICD-10-CM | POA: Insufficient documentation

## 2013-10-23 DIAGNOSIS — M549 Dorsalgia, unspecified: Secondary | ICD-10-CM | POA: Diagnosis not present

## 2013-10-23 DIAGNOSIS — I10 Essential (primary) hypertension: Secondary | ICD-10-CM | POA: Insufficient documentation

## 2013-10-23 LAB — CBG MONITORING, ED: GLUCOSE-CAPILLARY: 121 mg/dL — AB (ref 70–99)

## 2013-10-23 LAB — CBC
HEMATOCRIT: 37.6 % — AB (ref 39.0–52.0)
HEMOGLOBIN: 12.8 g/dL — AB (ref 13.0–17.0)
MCH: 30 pg (ref 26.0–34.0)
MCHC: 34 g/dL (ref 30.0–36.0)
MCV: 88.1 fL (ref 78.0–100.0)
Platelets: 225 10*3/uL (ref 150–400)
RBC: 4.27 MIL/uL (ref 4.22–5.81)
RDW: 12.5 % (ref 11.5–15.5)
WBC: 3.6 10*3/uL — AB (ref 4.0–10.5)

## 2013-10-23 LAB — COMPREHENSIVE METABOLIC PANEL
ALK PHOS: 74 U/L (ref 39–117)
ALT: 9 U/L (ref 0–53)
AST: 14 U/L (ref 0–37)
Albumin: 4 g/dL (ref 3.5–5.2)
BILIRUBIN TOTAL: 0.4 mg/dL (ref 0.3–1.2)
BUN: 6 mg/dL (ref 6–23)
CHLORIDE: 103 meq/L (ref 96–112)
CO2: 23 mEq/L (ref 19–32)
Calcium: 9.3 mg/dL (ref 8.4–10.5)
Creatinine, Ser: 0.92 mg/dL (ref 0.50–1.35)
GFR calc non Af Amer: 85 mL/min — ABNORMAL LOW (ref >90)
GLUCOSE: 119 mg/dL — AB (ref 70–99)
POTASSIUM: 3.9 meq/L (ref 3.7–5.3)
SODIUM: 140 meq/L (ref 137–147)
TOTAL PROTEIN: 6.9 g/dL (ref 6.0–8.3)

## 2013-10-23 MED ORDER — SODIUM CHLORIDE 0.9 % IV BOLUS (SEPSIS)
1000.0000 mL | Freq: Once | INTRAVENOUS | Status: AC
  Administered 2013-10-23: 1000 mL via INTRAVENOUS

## 2013-10-23 NOTE — ED Notes (Signed)
Pt states that he is hungry. Family member remains at bedside

## 2013-10-23 NOTE — ED Notes (Signed)
Pt and sister given sandwich and drink, states he feels much better

## 2013-10-23 NOTE — ED Provider Notes (Addendum)
CSN: [id number]     Arrival date & time 10/23/13  1242 History   First MD Initiated Contact with Patient 10/23/13 1323     Chief Complaint  Patient presents with  . Hyperglycemia     (Consider location/radiation/quality/duration/timing/severity/associated sxs/prior Treatment) Patient is a 68 y.o. male presenting with syncope. The history is provided by the patient and a relative.  Loss of Consciousness Episode history:  Single Most recent episode:  Today Duration:  2 minutes Timing:  Constant Progression:  Resolved Chronicity:  New Context: dehydration and standing up   Context comment:  Stood up to go lay down and felt lightheaded and vision went dark and sister states he collapsed on the floor and was out for a few min.  pt states yesterday was out in the heat and walked 3-43mi and was drinking alcohol.  no food or water today  Witnessed: yes   Relieved by:  Lying down Worsened by:  Nothing tried Ineffective treatments:  None tried Associated symptoms: dizziness and nausea   Associated symptoms: no chest pain, no confusion, no diaphoresis, no difficulty breathing, no focal weakness, no palpitations, no shortness of breath, no vomiting and no weakness   Associated symptoms comment:  Fatigue Risk factors: coronary artery disease   Risk factors comment:  Alcohol and tobacco use   Past Medical History  Diagnosis Date  . Hypertension   . Acute asthmatic bronchitis   . Cigarette smoker   . Coronary artery disease   . IBS (irritable bowel syndrome)   . Benign prostatic hypertrophy   . DJD (degenerative joint disease)   . Neck pain   . Back pain   . Psychiatric disorder   . Alcohol abuse    Past Surgical History  Procedure Laterality Date  . Cystoscopy  1999    Dr. Janice Norrie   History reviewed. No pertinent family history. History  Substance Use Topics  . Smoking status: Current Every Day Smoker -- 0.30 packs/day for 40 years    Types: Cigarettes  . Smokeless tobacco: Never  Used     Comment: has cut back alot per pt  . Alcohol Use: Yes    Review of Systems  Constitutional: Negative for diaphoresis.  Respiratory: Negative for shortness of breath.   Cardiovascular: Positive for syncope. Negative for chest pain and palpitations.  Gastrointestinal: Positive for nausea. Negative for vomiting.  Neurological: Positive for dizziness. Negative for focal weakness and weakness.  Psychiatric/Behavioral: Negative for confusion.  All other systems reviewed and are negative.     Allergies  Review of patient's allergies indicates no known allergies.  Home Medications   Prior to Admission medications   Medication Sig Start Date End Date Taking? Authorizing Provider  aspirin EC 81 MG tablet Take 81 mg by mouth daily.    Historical Provider, MD  Camphor-Eucalyptus-Menthol (VICKS VAPORUB) 4.73-1.2-2.6 % OINT Apply 1 application topically daily as needed. For congestion    Historical Provider, MD  etodolac (LODINE) 400 MG tablet Take 1 tablet (400 mg total) by mouth 2 (two) times daily as needed (pain). 10/27/12   Noralee Space, MD  ferrous sulfate 325 (65 FE) MG tablet Take 325 mg by mouth daily with breakfast.    Historical Provider, MD  fish oil-omega-3 fatty acids 1000 MG capsule Take 1 g by mouth daily.     Historical Provider, MD  lisinopril (PRINIVIL,ZESTRIL) 20 MG tablet Take 1 tablet (20 mg total) by mouth daily. 06/21/13   Noralee Space, MD  lisinopril (PRINIVIL,ZESTRIL)  5 MG tablet Take 1 tablet (5 mg total) by mouth daily. 10/27/12   Noralee Space, MD  nitroGLYCERIN (NITROSTAT) 0.4 MG SL tablet Place 0.4 mg under the tongue every 5 (five) minutes as needed. For chest pain    Historical Provider, MD  sildenafil (VIAGRA) 100 MG tablet Take 1 tablet (100 mg total) by mouth daily as needed for erectile dysfunction. 10/27/12   Noralee Space, MD  traMADol Veatrice Bourbon) 50 MG tablet take 1 tablet by mouth three times a day if needed for pain 09/15/13   Noralee Space, MD   vitamin C (ASCORBIC ACID) 500 MG tablet Take 500 mg by mouth daily.    Historical Provider, MD   BP 94/62  Pulse 86  Temp(Src) 98.1 F (36.7 C) (Oral)  Resp 16  Ht 6\' 2"  (1.88 m)  Wt 185 lb (83.915 kg)  BMI 23.74 kg/m2  SpO2 95% Physical Exam  Nursing note and vitals reviewed. Constitutional: He is oriented to person, place, and time. He appears well-developed and well-nourished. No distress.  HENT:  Head: Normocephalic and atraumatic.  Mouth/Throat: Oropharynx is clear and moist.  Eyes: Conjunctivae and EOM are normal. Pupils are equal, round, and reactive to light.  Neck: Normal range of motion. Neck supple.  Cardiovascular: Normal rate, regular rhythm and intact distal pulses.   No murmur heard. Pulmonary/Chest: Effort normal and breath sounds normal. No respiratory distress. He has no wheezes. He has no rales.  Abdominal: Soft. He exhibits no distension. There is no tenderness. There is no rebound and no guarding.  Musculoskeletal: Normal range of motion. He exhibits no edema and no tenderness.  Neurological: He is alert and oriented to person, place, and time.  Skin: Skin is warm and dry. No rash noted. No erythema.  Psychiatric: He has a normal mood and affect. His behavior is normal.    ED Course  Procedures (including critical care time) Labs Review Labs Reviewed  CBC - Abnormal; Notable for the following:    WBC 3.6 (*)    Hemoglobin 12.8 (*)    HCT 37.6 (*)    All other components within normal limits  COMPREHENSIVE METABOLIC PANEL - Abnormal; Notable for the following:    Glucose, Bld 119 (*)    GFR calc non Af Amer 85 (*)    All other components within normal limits  CBG MONITORING, ED - Abnormal; Notable for the following:    Glucose-Capillary 121 (*)    All other components within normal limits    Imaging Review No results found.   EKG Interpretation   Date/Time:  Saturday October 23 2013 13:00:45 EDT Ventricular Rate:  91 PR Interval:  148 QRS  Duration: 84 QT Interval:  370 QTC Calculation: 455 R Axis:   67 Text Interpretation:  Normal sinus rhythm Septal infarct , age  undetermined Abnormal ECG Confirmed by WARD,  DO, KRISTEN ([id number]) on  10/23/2013 1:05:41 PM      MDM   Final diagnoses:  Dehydration  Orthostatic syncope    Patient presenting after a syncope episode at home most consistent with orthostatic hypotension. Patient walked 4 miles outside yesterday had also had several alcoholic beverages. This morning he not eating or drinking anything and had a second alcoholic drink. He started at felt dizzy and then had a syncopal episode improvement of his sister. He woke up within 2-3 minutes and was back to his baseline. His only complaint is feeling mildly nauseated and tired. He denies chest pain, shortness  of breath or prior syncopal events. EKG without changes and normal exam here. Patient's blood sugar 121. Initial blood pressure was 94/62 and subsequent checks are 110 her 60s. The patient is dehydrated based on his history and symptoms. We'll give IV fluids and recheck.  2:13 PM All labs wnl.  After IVF will ambulate pt and feeling better will d/c home.  Low suspicion for dysrhythmia or ACS as the cause of syncope today.  Pt given instruction to stay cool and drink plenty of fluids today and avoid alcohol.  Blanchie Dessert, MD 10/23/13 1414  Blanchie Dessert, MD 10/23/13 [place], MD 10/23/13 1507

## 2013-10-23 NOTE — ED Notes (Signed)
Pt states he feels much better. And is ready to go home

## 2013-10-23 NOTE — Discharge Instructions (Signed)
Dehydration, Adult Dehydration means your body does not have as much fluid as it needs. Your kidneys, brain, and heart will not work properly without the right amount of fluids and salt.  HOME CARE  Ask your doctor how to replace body fluid losses (rehydrate).  Drink enough fluids to keep your pee (urine) clear or pale yellow.  Drink small amounts of fluids often if you feel sick to your stomach (nauseous) or throw up (vomit).  Eat like you normally do.  Avoid:  Foods or drinks high in sugar.  Bubbly (carbonated) drinks.  Juice.  Very hot or cold fluids.  Drinks with caffeine.  Fatty, greasy foods.  Alcohol.  Tobacco.  Eating too much.  Gelatin desserts.  Wash your hands to avoid spreading germs (bacteria, viruses).  Only take medicine as told by your doctor.  Keep all doctor visits as told. GET HELP RIGHT AWAY IF:   You cannot drink something without throwing up.  You get worse even with treatment.  Your vomit has blood in it or looks greenish.  Your poop (stool) has blood in it or looks black and tarry.  You have not peed in 6 to 8 hours.  You pee a small amount of very dark pee.  You have a fever.  You pass out (faint).  You have belly (abdominal) pain that gets worse or stays in one spot (localizes).  You have a rash, stiff neck, or bad headache.  You get easily annoyed, sleepy, or are hard to wake up.  You feel weak, dizzy, or very thirsty. MAKE SURE YOU:   Understand these instructions.  Will watch your condition.  Will get help right away if you are not doing well or get worse. Document Released: 02/16/2009 Document Revised: 07/15/2011 Document Reviewed: 12/10/2010 [facility] Patient Information 2015 [place], [place]. This information is not intended to replace advice given to you by your health care provider. Make sure you discuss any questions you have with your health care provider.  Avoid alcohol today and make sure to stay cool and  drink plenty of water.

## 2013-10-23 NOTE — ED Notes (Signed)
Pt discharged home with all belongings, pt alert, oriented and ambulatory upon discharge, pt escorted to exit via wheelchair by Magda Paganini RN, no new RX prescribed, pt verbalizes understanding of discharge instructions, pt driven home by family

## 2013-10-23 NOTE — ED Notes (Addendum)
He states today he felt weak and nauseated and "lost consciousness for a moment" and they called ems and found his blood sugar was elevated. He denies history of diabetes. He did not want to be transported by ems so family brought him here. His cbg here is 121.

## 2014-03-15 ENCOUNTER — Encounter (HOSPITAL_COMMUNITY): Payer: Self-pay | Admitting: *Deleted

## 2014-03-15 ENCOUNTER — Emergency Department (HOSPITAL_COMMUNITY)
Admission: EM | Admit: 2014-03-15 | Discharge: 2014-03-15 | Disposition: A | Discharge status: Home / Self Care | Payer: Medicare HMO | Attending: Emergency Medicine | Admitting: Emergency Medicine

## 2014-03-15 ENCOUNTER — Emergency Department (HOSPITAL_COMMUNITY): Payer: Medicare HMO

## 2014-03-15 DIAGNOSIS — Z8719 Personal history of other diseases of the digestive system: Secondary | ICD-10-CM | POA: Diagnosis not present

## 2014-03-15 DIAGNOSIS — Z7982 Long term (current) use of aspirin: Secondary | ICD-10-CM | POA: Insufficient documentation

## 2014-03-15 DIAGNOSIS — R05 Cough: Secondary | ICD-10-CM

## 2014-03-15 DIAGNOSIS — Z87438 Personal history of other diseases of male genital organs: Secondary | ICD-10-CM | POA: Diagnosis not present

## 2014-03-15 DIAGNOSIS — R059 Cough, unspecified: Secondary | ICD-10-CM

## 2014-03-15 DIAGNOSIS — Z8659 Personal history of other mental and behavioral disorders: Secondary | ICD-10-CM | POA: Insufficient documentation

## 2014-03-15 DIAGNOSIS — J441 Chronic obstructive pulmonary disease with (acute) exacerbation: Secondary | ICD-10-CM | POA: Diagnosis not present

## 2014-03-15 DIAGNOSIS — Z720 Tobacco use: Secondary | ICD-10-CM | POA: Diagnosis not present

## 2014-03-15 DIAGNOSIS — I2510 Atherosclerotic heart disease of native coronary artery without angina pectoris: Secondary | ICD-10-CM | POA: Insufficient documentation

## 2014-03-15 DIAGNOSIS — Z79899 Other long term (current) drug therapy: Secondary | ICD-10-CM | POA: Insufficient documentation

## 2014-03-15 DIAGNOSIS — M1990 Unspecified osteoarthritis, unspecified site: Secondary | ICD-10-CM | POA: Diagnosis not present

## 2014-03-15 DIAGNOSIS — I10 Essential (primary) hypertension: Secondary | ICD-10-CM | POA: Diagnosis not present

## 2014-03-15 MED ORDER — LEVOFLOXACIN 500 MG PO TABS: 500.0000 mg | ORAL_TABLET | Freq: Every day | ORAL | Status: DC

## 2014-03-15 MED ORDER — DM-GUAIFENESIN ER 30-600 MG PO TB12: 1.0000 | ORAL_TABLET | Freq: Two times a day (BID) | ORAL | Status: DC

## 2014-03-15 NOTE — ED Provider Notes (Signed)
CSN: [id number]     Arrival date & time 03/15/14  1249 History  This chart was scribed for non-physician practitioner, Alvina Chou, PA-C working with Janice Norrie, MD by Frederich Balding, ED scribe. This patient was seen in room TR04C/TR04C and the patient's care was started at 2:16 PM.   Chief Complaint  Patient presents with  . Cough   The history is provided by the patient. No language interpreter was used.    HPI Comments: Steven Lowery is a 68 y.o. male who presents to the Emergency Department complaining of nonproductive cough that started 3 weeks ago. Pt has taken robitussin with little relief. States his wife has similar symptoms.   Past Medical History  Diagnosis Date  . Hypertension   . Acute asthmatic bronchitis   . Cigarette smoker   . Coronary artery disease   . IBS (irritable bowel syndrome)   . Benign prostatic hypertrophy   . DJD (degenerative joint disease)   . Neck pain   . Back pain   . Psychiatric disorder   . Alcohol abuse    Past Surgical History  Procedure Laterality Date  . Cystoscopy  1999    Dr. Janice Norrie   History reviewed. No pertinent family history. History  Substance Use Topics  . Smoking status: Current Every Day Smoker -- 0.30 packs/day for 40 years    Types: Cigarettes  . Smokeless tobacco: Never Used     Comment: has cut back alot per pt  . Alcohol Use: Yes    Review of Systems  Respiratory: Positive for cough.   All other systems reviewed and are negative.  Allergies  Review of patient's allergies indicates no known allergies.  Home Medications   Prior to Admission medications   Medication Sig Start Date End Date Taking? Authorizing Provider  aspirin EC 81 MG tablet Take 81 mg by mouth daily.    Historical Provider, MD  ferrous sulfate 325 (65 FE) MG tablet Take 325 mg by mouth daily with breakfast.    Historical Provider, MD  fish oil-omega-3 fatty acids 1000 MG capsule Take 1 g by mouth daily.     Historical Provider, MD   HYDROcodone-acetaminophen (NORCO/VICODIN) 5-325 MG per tablet Take 1 tablet by mouth every 6 (six) hours as needed for moderate pain.  10/12/13   Historical Provider, MD  lisinopril (PRINIVIL,ZESTRIL) 20 MG tablet Take 1 tablet (20 mg total) by mouth daily. 06/21/13   Noralee Space, MD  Multiple Vitamin (MULTIVITAMIN WITH MINERALS) TABS tablet Take 1 tablet by mouth daily.    Historical Provider, MD  nitroGLYCERIN (NITROSTAT) 0.4 MG SL tablet Place 0.4 mg under the tongue every 5 (five) minutes as needed. For chest pain    Historical Provider, MD  OVER THE COUNTER MEDICATION Place 1 drop into both eyes daily as needed (Eye burning).    Historical Provider, MD  sildenafil (VIAGRA) 100 MG tablet Take 1 tablet (100 mg total) by mouth daily as needed for erectile dysfunction. 10/27/12   Noralee Space, MD  Trolamine Salicylate (ASPERCREME EX) Apply 1 application topically daily as needed (Finger pain).    Historical Provider, MD  vitamin C (ASCORBIC ACID) 500 MG tablet Take 500 mg by mouth daily.    Historical Provider, MD   BP 171/93 mmHg  Pulse 65  Temp(Src) 98 F (36.7 C) (Oral)  Resp 20  Ht 6\' 2"  (1.88 m)  Wt 180 lb (81.647 kg)  BMI 23.10 kg/m2  SpO2 100%   Physical  Exam  Constitutional: He is oriented to person, place, and time. He appears well-developed and well-nourished. No distress.  HENT:  Head: Normocephalic and atraumatic.  Eyes: Conjunctivae and EOM are normal.  Neck: Neck supple. No tracheal deviation present.  Cardiovascular: Normal rate, regular rhythm and normal heart sounds.   Pulmonary/Chest: Effort normal and breath sounds normal. No respiratory distress. He has no wheezes. He has no rhonchi. He has no rales.  Musculoskeletal: Normal range of motion.  No lower extremity edema or calf tenderness to palpation bilaterally.   Neurological: He is alert and oriented to person, place, and time.  Skin: Skin is warm and dry.  Psychiatric: He has a normal mood and affect. His  behavior is normal.  Nursing note and vitals reviewed.   ED Course  Procedures (including critical care time)  DIAGNOSTIC STUDIES: Oxygen Saturation is 100% on RA, normal by my interpretation.    COORDINATION OF CARE: 2:18 PM-Advised pt of xray results. Discussed treatment plan which includes cough syrup and a z-pack with pt at bedside and pt agreed to plan.   Labs Review Labs Reviewed - No data to display  Imaging Review Dg Chest 2 View  03/15/2014   CLINICAL DATA:  Dry cough, chest pressure x3 weeks. Hx of asthma, HTN, CAD. Smoker, half pack per day.  EXAM: CHEST  2 VIEW  COMPARISON:  06/21/2013  FINDINGS: Lungs are hyperexpanded. There is a relative paucity of vascular markings. No lung consolidation or edema. No pleural effusion or pneumothorax.  Bony thorax is unremarkable.  IMPRESSION: 1. No acute cardiopulmonary disease. 2. COPD.   Electronically Signed   By: Lajean Manes M.D.   On: 03/15/2014 14:10     EKG Interpretation None      MDM   Final diagnoses:  COPD exacerbation    2:22 PM Chest xray shows COPD. Patient will have levaquin and mucinex for symptoms. Vitals stable and patient afebrile. Patient instructed to follow up with PCP for further evaluation.   I personally performed the services described in this documentation, which was scribed in my presence. The recorded information has been reviewed and is accurate.  Alvina Chou, PA-C 03/15/14 [place], MD 03/15/14 1551

## 2014-03-15 NOTE — ED Notes (Addendum)
Coughing a lot, but not bringing nothing up; non productive; taking robitussin but doing no good. When he does cough (whitish/yellowish phlegm). "got it from wife."

## 2014-03-15 NOTE — Discharge Instructions (Signed)
Take levaquin as directed until gone. Take mucinex as directed for cough. Refer to attached documents for more information. Follow up with your doctor for further evaluation.

## 2014-06-03 ENCOUNTER — Emergency Department (HOSPITAL_COMMUNITY): Payer: Medicare HMO

## 2014-06-03 ENCOUNTER — Encounter (HOSPITAL_COMMUNITY): Payer: Self-pay | Admitting: Physical Medicine and Rehabilitation

## 2014-06-03 ENCOUNTER — Emergency Department (HOSPITAL_COMMUNITY)
Admission: EM | Admit: 2014-06-03 | Discharge: 2014-06-03 | Disposition: A | Discharge status: Home / Self Care | Payer: Medicare HMO | Attending: Emergency Medicine | Admitting: Emergency Medicine

## 2014-06-03 DIAGNOSIS — Z8719 Personal history of other diseases of the digestive system: Secondary | ICD-10-CM | POA: Insufficient documentation

## 2014-06-03 DIAGNOSIS — I10 Essential (primary) hypertension: Secondary | ICD-10-CM | POA: Diagnosis not present

## 2014-06-03 DIAGNOSIS — J069 Acute upper respiratory infection, unspecified: Secondary | ICD-10-CM | POA: Diagnosis not present

## 2014-06-03 DIAGNOSIS — Z87448 Personal history of other diseases of urinary system: Secondary | ICD-10-CM | POA: Diagnosis not present

## 2014-06-03 DIAGNOSIS — Z7982 Long term (current) use of aspirin: Secondary | ICD-10-CM | POA: Diagnosis not present

## 2014-06-03 DIAGNOSIS — Z792 Long term (current) use of antibiotics: Secondary | ICD-10-CM | POA: Diagnosis not present

## 2014-06-03 DIAGNOSIS — Z79899 Other long term (current) drug therapy: Secondary | ICD-10-CM | POA: Diagnosis not present

## 2014-06-03 DIAGNOSIS — Z720 Tobacco use: Secondary | ICD-10-CM | POA: Diagnosis not present

## 2014-06-03 DIAGNOSIS — I2510 Atherosclerotic heart disease of native coronary artery without angina pectoris: Secondary | ICD-10-CM | POA: Diagnosis not present

## 2014-06-03 DIAGNOSIS — R05 Cough: Secondary | ICD-10-CM | POA: Diagnosis present

## 2014-06-03 DIAGNOSIS — Z760 Encounter for issue of repeat prescription: Secondary | ICD-10-CM | POA: Diagnosis not present

## 2014-06-03 MED ORDER — ALBUTEROL SULFATE HFA 108 (90 BASE) MCG/ACT IN AERS
2.0000 | INHALATION_SPRAY | Freq: Once | RESPIRATORY_TRACT | Status: AC
  Administered 2014-06-03: 2 via RESPIRATORY_TRACT
  Filled 2014-06-03: qty 6.7

## 2014-06-03 MED ORDER — SALINE SPRAY 0.65 % NA SOLN: 1.0000 | NASAL | Status: DC | PRN

## 2014-06-03 MED ORDER — DM-GUAIFENESIN ER 30-600 MG PO TB12
1.0000 | ORAL_TABLET | Freq: Two times a day (BID) | ORAL | Status: DC
Start: 2014-06-03 — End: 2015-04-17

## 2014-06-03 NOTE — ED Notes (Addendum)
Pt states body aches, sinus congestion, and productive cough. Ongoing for several days. Respirations unlabored. Pt is alert and oriented x4.

## 2014-06-03 NOTE — ED Provider Notes (Signed)
CSN: [id number]     Arrival date & time 06/03/14  1015 History   First MD Initiated Contact with Patient 06/03/14 1317     Chief Complaint  Patient presents with  . Pain  . Cough  . Facial Pain     (Consider location/radiation/quality/duration/timing/severity/associated sxs/prior Treatment) HPI Steven Lowery is a 69 year old male with past medical history of hypertension, CAD, COPD who presents to the ER complaining of 3-4 days of nasal congestion and cough. Patient states his symptoms began gradually approximately 3 days ago, and have since persisted. Patient reports his cough is worse at night, and he has had some mild production with it. Patient denies shortness of breath, chest pain, wheezing, fever, nausea, vomiting, diarrhea.  Past Medical History  Diagnosis Date  . Hypertension   . Acute asthmatic bronchitis   . Cigarette smoker   . Coronary artery disease   . IBS (irritable bowel syndrome)   . Benign prostatic hypertrophy   . DJD (degenerative joint disease)   . Neck pain   . Back pain   . Psychiatric disorder   . Alcohol abuse    Past Surgical History  Procedure Laterality Date  . Cystoscopy  1999    Dr. Janice Norrie   History reviewed. No pertinent family history. History  Substance Use Topics  . Smoking status: Current Every Day Smoker -- 0.30 packs/day for 40 years    Types: Cigarettes  . Smokeless tobacco: Never Used     Comment: has cut back alot per pt  . Alcohol Use: Yes    Review of Systems  Constitutional: Negative for fever.  HENT: Positive for congestion. Negative for trouble swallowing.   Eyes: Negative for visual disturbance.  Respiratory: Positive for cough. Negative for shortness of breath.   Cardiovascular: Negative for chest pain.  Gastrointestinal: Negative for nausea, vomiting and abdominal pain.  Genitourinary: Negative for dysuria.  Musculoskeletal: Negative for neck pain.  Skin: Negative for rash.  Neurological: Negative for dizziness,  weakness and numbness.  Psychiatric/Behavioral: Negative.       Allergies  Review of patient's allergies indicates no known allergies.  Home Medications   Prior to Admission medications   Medication Sig Start Date End Date Taking? Authorizing Provider  Acetaminophen (TYLENOL ARTHRITIS PAIN PO) Take 1 tablet by mouth every 6 (six) hours as needed (arthritis).   Yes Historical Provider, MD  aspirin EC 81 MG tablet Take 81 mg by mouth daily.   Yes Historical Provider, MD  Camphor-Eucalyptus-Menthol (ICY HOT NO-MESS VAPOR GEL EX) Apply 1 application topically daily as needed (hip pain).   Yes Historical Provider, MD  fish oil-omega-3 fatty acids 1000 MG capsule Take 1 g by mouth daily.    Yes Historical Provider, MD  levofloxacin (LEVAQUIN) 500 MG tablet Take 1 tablet (500 mg total) by mouth daily. 03/15/14  Yes Kaitlyn Szekalski, PA-C  lisinopril (PRINIVIL,ZESTRIL) 20 MG tablet Take 1 tablet (20 mg total) by mouth daily. 06/21/13  Yes Noralee Space, MD  Multiple Vitamin (MULTIVITAMIN WITH MINERALS) TABS tablet Take 1 tablet by mouth daily.   Yes Historical Provider, MD  nitroGLYCERIN (NITROSTAT) 0.4 MG SL tablet Place 0.4 mg under the tongue every 5 (five) minutes as needed. For chest pain   Yes Historical Provider, MD  OVER THE COUNTER MEDICATION Place 1 drop into both eyes daily as needed (Eye burning).   Yes Historical Provider, MD  sildenafil (VIAGRA) 100 MG tablet Take 1 tablet (100 mg total) by mouth daily as needed for erectile  dysfunction. 10/27/12  Yes Noralee Space, MD  vitamin C (ASCORBIC ACID) 500 MG tablet Take 500 mg by mouth daily.   Yes Historical Provider, MD  dextromethorphan-guaiFENesin (MUCINEX DM) 30-600 MG per 12 hr tablet Take 1 tablet by mouth 2 (two) times daily. 06/03/14   Carrie Mew, PA-C  sodium chloride (OCEAN) 0.65 % SOLN nasal spray Place 1 spray into both nostrils as needed for congestion. 06/03/14   Carrie Mew, PA-C   BP 147/85 mmHg  Pulse 87  Temp(Src)  98.5 F (36.9 C) (Oral)  Resp 16  SpO2 97% Physical Exam  Constitutional: He is oriented to person, place, and time. He appears well-developed and well-nourished. No distress.  HENT:  Head: Normocephalic and atraumatic.  Mouth/Throat: Oropharynx is clear and moist. No oropharyngeal exudate.  Eyes: Right eye exhibits no discharge. Left eye exhibits no discharge. No scleral icterus.  Neck: Normal range of motion.  Cardiovascular: Normal rate, regular rhythm and normal heart sounds.   No murmur heard. Pulmonary/Chest: Effort normal and breath sounds normal. No accessory muscle usage. No tachypnea. No respiratory distress.  Abdominal: Soft. Normal appearance. There is no tenderness.  Musculoskeletal: Normal range of motion. He exhibits no edema or tenderness.  Neurological: He is alert and oriented to person, place, and time. He has normal strength. No cranial nerve deficit or sensory deficit. Coordination normal. GCS eye subscore is 4. GCS verbal subscore is 5. GCS motor subscore is 6.  Skin: Skin is warm and dry. No rash noted. He is not diaphoretic.  Psychiatric: He has a normal mood and affect.  Nursing note and vitals reviewed.   ED Course  Procedures (including critical care time) Labs Review Labs Reviewed - No data to display  Imaging Review Dg Chest 2 View  06/03/2014   CLINICAL DATA:  Cough and chest pain for 3 months  EXAM: CHEST  2 VIEW  COMPARISON:  03/15/2014  FINDINGS: Cardiac shadow is stable. The lungs are again hyperinflated consistent with COPD. No acute infiltrate or sizable effusion is noted. No acute bony abnormality is seen.  IMPRESSION: COPD without acute abnormality.   Electronically Signed   By: Inez Catalina M.D.   On: 06/03/2014 12:13     EKG Interpretation None      MDM   Final diagnoses:  URI (upper respiratory infection)    Pt CXR negative for acute infiltrate. Patients symptoms are consistent with URI, likely viral etiology. No wheezing noted on  exam, do not suspect exacerbation of COPD. Patient afebrile, non-tachycardic, nontachypneic, non-hypoxic, well-appearing and in no acute distress. Patient stating he is out of his prescribed inhaler, and requesting another one. We will refill that today. Discussed that antibiotics are not indicated for viral infections. Pt will be discharged with symptomatic treatment.  Verbalizes understanding and is agreeable with plan. Pt is hemodynamically stable & in NAD prior to dc. I strongly encouraged patient to follow-up with his primary care physician, and encouraged patient to call or return to the ER should he have any questions or concerns.  BP 147/85 mmHg  Pulse 87  Temp(Src) 98.5 F (36.9 C) (Oral)  Resp 16  SpO2 97%  Signed,  Dahlia Bailiff, PA-C 4:59 PM  Patient discussed with Dr. Virgel Manifold, MD     Carrie Mew, PA-C 06/03/14 1659  Virgel Manifold, MD 06/06/14 [phone]

## 2014-06-03 NOTE — Discharge Instructions (Signed)
Follow-up with your primary care doctor. Return to the ER if any high fever, worsening of symptoms, severe shortness of breath, wheezing.  Upper Respiratory Infection, Adult An upper respiratory infection (URI) is also sometimes known as the common cold. The upper respiratory tract includes the nose, sinuses, throat, trachea, and bronchi. Bronchi are the airways leading to the lungs. Most people improve within 1 week, but symptoms can last up to 2 weeks. A residual cough may last even longer.  CAUSES Many different viruses can infect the tissues lining the upper respiratory tract. The tissues become irritated and inflamed and often become very moist. Mucus production is also common. A cold is contagious. You can easily spread the virus to others by oral contact. This includes kissing, sharing a glass, coughing, or sneezing. Touching your mouth or nose and then touching a surface, which is then touched by another person, can also spread the virus. SYMPTOMS  Symptoms typically develop 1 to 3 days after you come in contact with a cold virus. Symptoms vary from person to person. They may include:  Runny nose.  Sneezing.  Nasal congestion.  Sinus irritation.  Sore throat.  Loss of voice (laryngitis).  Cough.  Fatigue.  Muscle aches.  Loss of appetite.  Headache.  Low-grade fever. DIAGNOSIS  You might diagnose your own cold based on familiar symptoms, since most people get a cold 2 to 3 times a year. Your caregiver can confirm this based on your exam. Most importantly, your caregiver can check that your symptoms are not due to another disease such as strep throat, sinusitis, pneumonia, asthma, or epiglottitis. Blood tests, throat tests, and X-rays are not necessary to diagnose a common cold, but they may sometimes be helpful in excluding other more serious diseases. Your caregiver will decide if any further tests are required. RISKS AND COMPLICATIONS  You may be at risk for a more severe  case of the common cold if you smoke cigarettes, have chronic heart disease (such as heart failure) or lung disease (such as asthma), or if you have a weakened immune system. The very young and very old are also at risk for more serious infections. Bacterial sinusitis, middle ear infections, and bacterial pneumonia can complicate the common cold. The common cold can worsen asthma and chronic obstructive pulmonary disease (COPD). Sometimes, these complications can require emergency medical care and may be life-threatening. PREVENTION  The best way to protect against getting a cold is to practice good hygiene. Avoid oral or hand contact with people with cold symptoms. Wash your hands often if contact occurs. There is no clear evidence that vitamin C, vitamin E, echinacea, or exercise reduces the chance of developing a cold. However, it is always recommended to get plenty of rest and practice good nutrition. TREATMENT  Treatment is directed at relieving symptoms. There is no cure. Antibiotics are not effective, because the infection is caused by a virus, not by bacteria. Treatment may include:  Increased fluid intake. Sports drinks offer valuable electrolytes, sugars, and fluids.  Breathing heated mist or steam (vaporizer or shower).  Eating chicken soup or other clear broths, and maintaining good nutrition.  Getting plenty of rest.  Using gargles or lozenges for comfort.  Controlling fevers with ibuprofen or acetaminophen as directed by your caregiver.  Increasing usage of your inhaler if you have asthma. Zinc gel and zinc lozenges, taken in the first 24 hours of the common cold, can shorten the duration and lessen the severity of symptoms. Pain medicines may  help with fever, muscle aches, and throat pain. A variety of non-prescription medicines are available to treat congestion and runny nose. Your caregiver can make recommendations and may suggest nasal or lung inhalers for other symptoms.  HOME  CARE INSTRUCTIONS   Only take over-the-counter or prescription medicines for pain, discomfort, or fever as directed by your caregiver.  Use a warm mist humidifier or inhale steam from a shower to increase air moisture. This may keep secretions moist and make it easier to breathe.  Drink enough water and fluids to keep your urine clear or pale yellow.  Rest as needed.  Return to work when your temperature has returned to normal or as your caregiver advises. You may need to stay home longer to avoid infecting others. You can also use a face mask and careful hand washing to prevent spread of the virus. SEEK MEDICAL CARE IF:   After the first few days, you feel you are getting worse rather than better.  You need your caregiver's advice about medicines to control symptoms.  You develop chills, worsening shortness of breath, or brown or red sputum. These may be signs of pneumonia.  You develop yellow or brown nasal discharge or pain in the face, especially when you bend forward. These may be signs of sinusitis.  You develop a fever, swollen neck glands, pain with swallowing, or white areas in the back of your throat. These may be signs of strep throat. SEEK IMMEDIATE MEDICAL CARE IF:   You have a fever.  You develop severe or persistent headache, ear pain, sinus pain, or chest pain.  You develop wheezing, a prolonged cough, cough up blood, or have a change in your usual mucus (if you have chronic lung disease).  You develop sore muscles or a stiff neck. Document Released: 10/16/2000 Document Revised: 07/15/2011 Document Reviewed: 07/28/2013 [facility] Patient Information 2015 [place], [place]. This information is not intended to replace advice given to you by your health care provider. Make sure you discuss any questions you have with your health care provider.

## 2014-09-27 ENCOUNTER — Telehealth: Payer: Self-pay | Admitting: Pulmonary Disease

## 2014-09-27 NOTE — Telephone Encounter (Signed)
Spoke with pt, states that he is requesting a recommendation on a PCP from SN.  States he is sad to not see SN anymore and would appreciate his recommendation on a provider.  Pt prefers to stay with [facility] if possible.  Dr. Lenna Gilford do you have a specific recommendation for a provider for pt?  Thanks!

## 2014-09-28 NOTE — Telephone Encounter (Signed)
Per SN, he recommends any physician at [facility] on [address] to take over care. Pt was called and notified of this. Nothing further is needed.

## 2014-09-28 NOTE — Telephone Encounter (Signed)
SN please advise. Thanks.  

## 2014-09-28 NOTE — Telephone Encounter (Signed)
Pt returning cal and can be reached @ [phone].Steven Lowery'

## 2014-10-25 ENCOUNTER — Ambulatory Visit: Payer: Medicare HMO | Admitting: Family Medicine

## 2014-11-14 ENCOUNTER — Encounter: Payer: Self-pay | Admitting: Family Medicine

## 2014-11-14 ENCOUNTER — Ambulatory Visit (INDEPENDENT_AMBULATORY_CARE_PROVIDER_SITE_OTHER): Payer: Commercial Managed Care - HMO | Admitting: Family Medicine

## 2014-11-14 VITALS — BP 165/86 | HR 69 | Temp 98.2°F | Resp 16 | Ht 74.0 in | Wt 174.0 lb

## 2014-11-14 DIAGNOSIS — I10 Essential (primary) hypertension: Secondary | ICD-10-CM

## 2014-11-14 LAB — CBC WITH DIFFERENTIAL/PLATELET
Basophils Absolute: 0 10*3/uL (ref 0.0–0.1)
Basophils Relative: 1 % (ref 0–1)
Eosinophils Absolute: 0 10*3/uL (ref 0.0–0.7)
Eosinophils Relative: 1 % (ref 0–5)
HCT: 41 % (ref 39.0–52.0)
Hemoglobin: 14 g/dL (ref 13.0–17.0)
LYMPHS ABS: 1.7 10*3/uL (ref 0.7–4.0)
Lymphocytes Relative: 46 % (ref 12–46)
MCH: 30.3 pg (ref 26.0–34.0)
MCHC: 34.1 g/dL (ref 30.0–36.0)
MCV: 88.7 fL (ref 78.0–100.0)
MPV: 9.1 fL (ref 8.6–12.4)
Monocytes Absolute: 0.2 10*3/uL (ref 0.1–1.0)
Monocytes Relative: 6 % (ref 3–12)
NEUTROS ABS: 1.7 10*3/uL (ref 1.7–7.7)
Neutrophils Relative %: 46 % (ref 43–77)
PLATELETS: 238 10*3/uL (ref 150–400)
RBC: 4.62 MIL/uL (ref 4.22–5.81)
RDW: 14.9 % (ref 11.5–15.5)
WBC: 3.7 10*3/uL — ABNORMAL LOW (ref 4.0–10.5)

## 2014-11-14 LAB — COMPLETE METABOLIC PANEL WITH GFR
ALT: 10 U/L (ref 0–53)
AST: 14 U/L (ref 0–37)
Albumin: 4.3 g/dL (ref 3.5–5.2)
Alkaline Phosphatase: 54 U/L (ref 39–117)
BUN: 7 mg/dL (ref 6–23)
CHLORIDE: 103 meq/L (ref 96–112)
CO2: 27 meq/L (ref 19–32)
CREATININE: 0.95 mg/dL (ref 0.50–1.35)
Calcium: 9.5 mg/dL (ref 8.4–10.5)
GFR, Est Non African American: 81 mL/min
GLUCOSE: 87 mg/dL (ref 70–99)
Potassium: 4.3 mEq/L (ref 3.5–5.3)
Sodium: 141 mEq/L (ref 135–145)
TOTAL PROTEIN: 6.8 g/dL (ref 6.0–8.3)
Total Bilirubin: 0.5 mg/dL (ref 0.2–1.2)

## 2014-11-14 LAB — LIPID PANEL
CHOL/HDL RATIO: 2.4 ratio
Cholesterol: 146 mg/dL (ref 0–200)
HDL: 62 mg/dL (ref >=40)
LDL Cholesterol: 73 mg/dL (ref 0–99)
Triglycerides: 54 mg/dL (ref <150)
VLDL: 11 mg/dL (ref 0–40)

## 2014-11-14 LAB — TSH: TSH: 0.722 u[IU]/mL (ref 0.350–4.500)

## 2014-11-14 MED ORDER — LISINOPRIL 20 MG PO TABS: 20.0000 mg | ORAL_TABLET | Freq: Every day | ORAL | Status: DC

## 2014-11-14 MED ORDER — TRAMADOL HCL 50 MG PO TABS: 50.0000 mg | ORAL_TABLET | Freq: Three times a day (TID) | ORAL | Status: DC | PRN

## 2014-11-14 MED ORDER — ASPIRIN EC 81 MG PO TBEC: 81.0000 mg | DELAYED_RELEASE_TABLET | Freq: Every day | ORAL | Status: DC

## 2014-11-14 MED ORDER — NITROGLYCERIN 0.4 MG SL SUBL: 0.4000 mg | SUBLINGUAL_TABLET | SUBLINGUAL | Status: DC | PRN

## 2014-11-14 NOTE — Patient Instructions (Signed)
We have done bloodwork today and will let you know if there are any abnormalities I am re

## 2014-11-14 NOTE — Progress Notes (Signed)
Patient ID: Steven Lowery, male   DOB: [birth year], 69 y.o.   MRN: [id number]   Steven Lowery, is a 69 y.o. male  GEX:[id number]  WNU:[id number]  DOB - 06/14/1945  CC:  Chief Complaint  Patient presents with  . Establish Care    back pain for years. Problems with urination retention. would like to start viagria        HPI: Steven Lowery is a 69 y.o. male here to establish care as a new patient. He has been a patient of Dr. Teressa Lower in the past but has not seen a doctor or had medications in about a year. He reports having MI about 3 years ago but I have found no definitive diagnosis There is a diagnosis of CAD in his record. He has had a RX for NTG but does not remember the last time he used it if he has used it ever.He reports he has had no chest pain. He also has a history of COPD but he denies any problems with that on a regular basis. He does report having an inhaler. He has a history of BPH, Alcohol abuse and tobacco use.He has neck, back and shoulder pain with a diagnosis of osteoarthitis. He did have a accident and hurt his back a number of years ago he request pain medication, particularly to help with pain at night. He also request a prescription for viagra for erectile dysfunction.  No Known Allergies Past Medical History  Diagnosis Date  . Hypertension   . Acute asthmatic bronchitis   . Cigarette smoker   . Coronary artery disease   . IBS (irritable bowel syndrome)   . Benign prostatic hypertrophy   . DJD (degenerative joint disease)   . Neck pain   . Back pain   . Psychiatric disorder   . Alcohol abuse    Current Outpatient Prescriptions on File Prior to Visit  Medication Sig Dispense Refill  . Acetaminophen (TYLENOL ARTHRITIS PAIN PO) Take 1 tablet by mouth every 6 (six) hours as needed (arthritis).    [name] aspirin EC 81 MG tablet Take 81 mg by mouth daily.    . fish oil-omega-3 fatty acids 1000 MG capsule Take 1 g by mouth daily.     . nitroGLYCERIN (NITROSTAT) 0.4  MG SL tablet Place 0.4 mg under the tongue every 5 (five) minutes as needed. For chest pain    . Camphor-Eucalyptus-Menthol (ICY HOT NO-MESS VAPOR GEL EX) Apply 1 application topically daily as needed (hip pain).    [name] dextromethorphan-guaiFENesin (MUCINEX DM) 30-600 MG per 12 hr tablet Take 1 tablet by mouth 2 (two) times daily. (Patient not taking: Reported on 11/14/2014) 30 tablet 0  . levofloxacin (LEVAQUIN) 500 MG tablet Take 1 tablet (500 mg total) by mouth daily. (Patient not taking: Reported on 11/14/2014) 7 tablet 0  . lisinopril (PRINIVIL,ZESTRIL) 20 MG tablet Take 1 tablet (20 mg total) by mouth daily. (Patient not taking: Reported on 11/14/2014) 30 tablet 11  . Multiple Vitamin (MULTIVITAMIN WITH MINERALS) TABS tablet Take 1 tablet by mouth daily.    [name] OVER THE COUNTER MEDICATION Place 1 drop into both eyes daily as needed (Eye burning).    . sildenafil (VIAGRA) 100 MG tablet Take 1 tablet (100 mg total) by mouth daily as needed for erectile dysfunction. (Patient not taking: Reported on 11/14/2014) 10 tablet 0  . sodium chloride (OCEAN) 0.65 % SOLN nasal spray Place 1 spray into both nostrils as needed for congestion. (Patient not taking: Reported  on 11/14/2014) 30 mL 0  . vitamin C (ASCORBIC ACID) 500 MG tablet Take 500 mg by mouth daily.     No current facility-administered medications on file prior to visit.   No family history on file. History   Social History  . Marital Status: Married    Spouse Name: N/A  . Number of Children: N/A  . Years of Education: N/A   Occupational History  . Not on file.   Social History Main Topics  . Smoking status: Current Every Day Smoker -- 0.30 packs/day for 40 years    Types: Cigarettes  . Smokeless tobacco: Never Used     Comment: has cut back alot per pt  . Alcohol Use: Yes  . Drug Use: No  . Sexual Activity: Not on file   Other Topics Concern  . Not on file   Social History Narrative    Review of Systems: Constitutional: Negative  for fever, chills, appetite change, weight loss,  fatigue. HENT: Negative for ear pain, ear discharge.nose bleeds Eyes: Negative for discharge, redness, itching and visual disturbance.Positive for occassional sharp pain Neck: Positive  for pain, stiffness Respiratory: Negative for cough, shortness of breath,   Cardiovascular: Negative for chest pain, palpitations and leg swelling. Gastrointestinal: Negative for abdominal distention, abdominal pain, nausea, vomiting, diarrhea, constipations Genitourinary: Negative for dysuria, urgency, hematuria, flank pain, Positive for frequency Musculoskeletal: Positivefor back pain, neck pain, left shoulder pain and left knee pain. arthralgia and gait problem.Negative for weakness. Neurological: Negative for dizziness, tremors, seizures, syncope,   light-headedness, numbness. Positive for occassional headaches Hematological: Negative for easy bruising or bleeding Psychiatric/Behavioral: Negative for depression, anxiety, decreased concentration, confusion   Objective:   Filed Vitals:   11/14/14 1042  BP: 165/86  Pulse: 69  Temp: 98.2 F (36.8 C)  Resp: 16    Physical Exam: Constitutional: Patient appears well-developed and well-nourished. No distress. HENT: Normocephalic, atraumatic, External right and left ear normal. Oropharynx is clear and moist.  Eyes: Conjunctivae and EOM are normal. PERRLA, no scleral icterus. Neck: Normal ROM. Neck supple. No lymphadenopathy, No thyromegaly. CVS: RRR, S1/S2 +, no murmurs, no gallops, no rubs Pulmonary: Effort and breath sounds normal, no stridor, rhonchi, wheezes, rales.  Abdominal: Soft. Normoactive BS,, no distension, tenderness, rebound or guarding.  Musculoskeletal: Normal range of motion. No edema and no tenderness.  Neuro: Alert.Normal muscle tone coordination. Non-focal Skin: Skin is warm and dry. No rash noted. Not diaphoretic. No erythema. No pallor. Psychiatric: Normal mood and affect. Behavior,  judgment, thought content normal.  Lab Results  Component Value Date   WBC 3.6* 10/23/2013   HGB 12.8* 10/23/2013   HCT 37.6* 10/23/2013   MCV 88.1 10/23/2013   PLT 225 10/23/2013   Lab Results  Component Value Date   CREATININE 0.92 10/23/2013   BUN 6 10/23/2013   NA 140 10/23/2013   K 3.9 10/23/2013   CL 103 10/23/2013   CO2 23 10/23/2013    Lab Results  Component Value Date   HGBA1C 5.4 09/18/2011   Lipid Panel     Component Value Date/Time   CHOL 169 10/27/2012 1003   TRIG 57.0 10/27/2012 1003   HDL 65.20 10/27/2012 1003   CHOLHDL 3 10/27/2012 1003   VLDL 11.4 10/27/2012 1003   LDLCALC 92 10/27/2012 1003       Assessment and plan:   Hypertension, uncontrolled, not under treatment at present -Restart lisinopril 20 mg daily -CMP with GFR -CBC- -TSH -lipid panel - reminded of low salt  diet and exercise. Follow-up in one month.  BPH -PSA today  COPD/Asmatic bronchitis, not causing any problems at this time -Follow-up as needed.  Joint pain of back, neck, left shoulder , left knee, causing significant pain -Tramadol 50 mg #30, one po q 8 hours prn.  Erectile Disorder - I have expressed by discomfort with prescribing Viagra or other medication due to his CAD -will do further research and discuss with Dr. Doreene Burke  Tobacco and Alcohol abuse - Will delve into this in more detail at next visit.    Micheline Chapman, FNP-BC    No Follow-up on file.  The patient was given clear instructions to go to ER or return to medical center if symptoms don't improve, worsen or new problems develop. The patient verbalized understanding. The patient was told to call to get lab results if they haven't heard anything in the next week.     This note has been created with Surveyor, quantity. Any transcriptional errors are unintentional.    Micheline Chapman, MSN, FNP-BC   11/14/2014, 11:18 AM

## 2014-11-15 LAB — PSA: PSA: 1.07 ng/mL (ref <=4.00)

## 2014-12-08 ENCOUNTER — Emergency Department (HOSPITAL_COMMUNITY): Payer: Commercial Managed Care - HMO

## 2014-12-08 ENCOUNTER — Encounter (HOSPITAL_COMMUNITY): Payer: Self-pay | Admitting: Emergency Medicine

## 2014-12-08 DIAGNOSIS — I10 Essential (primary) hypertension: Secondary | ICD-10-CM | POA: Insufficient documentation

## 2014-12-08 DIAGNOSIS — J45909 Unspecified asthma, uncomplicated: Secondary | ICD-10-CM | POA: Insufficient documentation

## 2014-12-08 DIAGNOSIS — Z8659 Personal history of other mental and behavioral disorders: Secondary | ICD-10-CM | POA: Insufficient documentation

## 2014-12-08 DIAGNOSIS — M549 Dorsalgia, unspecified: Secondary | ICD-10-CM | POA: Insufficient documentation

## 2014-12-08 DIAGNOSIS — Z8719 Personal history of other diseases of the digestive system: Secondary | ICD-10-CM | POA: Insufficient documentation

## 2014-12-08 DIAGNOSIS — Z79899 Other long term (current) drug therapy: Secondary | ICD-10-CM | POA: Insufficient documentation

## 2014-12-08 DIAGNOSIS — Z7982 Long term (current) use of aspirin: Secondary | ICD-10-CM | POA: Diagnosis not present

## 2014-12-08 DIAGNOSIS — I2510 Atherosclerotic heart disease of native coronary artery without angina pectoris: Secondary | ICD-10-CM | POA: Insufficient documentation

## 2014-12-08 DIAGNOSIS — Z720 Tobacco use: Secondary | ICD-10-CM | POA: Insufficient documentation

## 2014-12-08 DIAGNOSIS — R079 Chest pain, unspecified: Secondary | ICD-10-CM | POA: Insufficient documentation

## 2014-12-08 DIAGNOSIS — Z87448 Personal history of other diseases of urinary system: Secondary | ICD-10-CM | POA: Insufficient documentation

## 2014-12-08 DIAGNOSIS — G8929 Other chronic pain: Secondary | ICD-10-CM | POA: Insufficient documentation

## 2014-12-08 DIAGNOSIS — M542 Cervicalgia: Secondary | ICD-10-CM | POA: Diagnosis not present

## 2014-12-08 LAB — BASIC METABOLIC PANEL
Anion gap: 8 (ref 5–15)
BUN: 13 mg/dL (ref 6–20)
CHLORIDE: 105 mmol/L (ref 101–111)
CO2: 25 mmol/L (ref 22–32)
Calcium: 9.5 mg/dL (ref 8.9–10.3)
Creatinine, Ser: 1.19 mg/dL (ref 0.61–1.24)
GFR calc Af Amer: >60 mL/min (ref >60)
GLUCOSE: 108 mg/dL — AB (ref 65–99)
POTASSIUM: 4.1 mmol/L (ref 3.5–5.1)
SODIUM: 138 mmol/L (ref 135–145)

## 2014-12-08 LAB — CBC
HCT: 38.6 % — ABNORMAL LOW (ref 39.0–52.0)
Hemoglobin: 13.5 g/dL (ref 13.0–17.0)
MCH: 30.5 pg (ref 26.0–34.0)
MCHC: 35 g/dL (ref 30.0–36.0)
MCV: 87.3 fL (ref 78.0–100.0)
Platelets: 245 10*3/uL (ref 150–400)
RBC: 4.42 MIL/uL (ref 4.22–5.81)
RDW: 13.4 % (ref 11.5–15.5)
WBC: 5.1 10*3/uL (ref 4.0–10.5)

## 2014-12-08 LAB — I-STAT TROPONIN, ED: Troponin i, poc: 0 ng/mL (ref 0.00–0.08)

## 2014-12-08 NOTE — ED Notes (Signed)
Pt. reports intermittent left chest pain with mild SOB , occasional dry cough ,nausea and diaphoresis onset this evening .

## 2014-12-09 ENCOUNTER — Emergency Department (HOSPITAL_COMMUNITY)
Admission: EM | Admit: 2014-12-09 | Discharge: 2014-12-09 | Disposition: A | Discharge status: Home / Self Care | Payer: Commercial Managed Care - HMO | Attending: Emergency Medicine | Admitting: Emergency Medicine

## 2014-12-09 DIAGNOSIS — G8929 Other chronic pain: Secondary | ICD-10-CM

## 2014-12-09 DIAGNOSIS — R079 Chest pain, unspecified: Secondary | ICD-10-CM | POA: Diagnosis not present

## 2014-12-09 LAB — TROPONIN I

## 2014-12-09 MED ORDER — NAPROXEN 500 MG PO TABS: 500.0000 mg | ORAL_TABLET | Freq: Two times a day (BID) | ORAL | Status: DC

## 2014-12-09 NOTE — ED Notes (Signed)
Pt able to ambulate in room independently.  

## 2014-12-09 NOTE — Discharge Instructions (Signed)
Back Pain: ° ° °Your back pain should be treated with medicines such as ibuprofen or aleve and this back pain should get better over the next 2 weeks.  However if you develop severe or worsening pain, low back pain with fever, numbness, weakness or inability to walk or urinate, you should return to the ER immediately.  Please follow up with your doctor this week for a recheck if still having symptoms. °Low back pain is discomfort in the lower back that may be due to injuries to muscles and ligaments around the spine.  Occasionally, it may be caused by a a problem to a part of the spine called a disc.  The pain may last several days or a week;  However, most patients get completely well in 4 weeks. ° °Self - care:  The application of heat can help soothe the pain.  Maintaining your daily activities, including walking, is encourged, as it will help you get better faster than just staying in bed. ° °Medications are also useful to help with pain control.  A commonly prescribed medications includes acetaminophen.  This medication is generally safe, though you should not take more than 8 of the extra strength (500mg) pills a day. ° °Non steroidal anti inflammatory medications including Ibuprofen and naproxen;  These medications help both pain and swelling and are very useful in treating back pain.  They should be taken with food, as they can cause stomach upset, and more seriously, stomach bleeding.   ° °Muscle relaxants:  These medications can help with muscle tightness that is a cause of lower back pain.  Most of these medications can cause drowsiness, and it is not safe to drive or use dangerous machinery while taking them. ° °You will need to follow up with  Your primary healthcare provider in 1-2 weeks for reassessment. ° °Be aware that if you develop new symptoms, such as a fever, leg weakness, difficulty with or loss of control of your urine or bowels, abdominal pain, or more severe pain, you will need to seek  medical attention and  / or return to the Emergency department. ° °If you do not have a doctor see the list below. ° °RESOURCE GUIDE ° °Chronic Pain Problems: °Contact [facility]  [phone] °Patients need to be referred by their primary care doctor. ° °Insufficient Money for Medicine: °Contact United Way:  call "211" or [facility] 271-5999. ° °No Primary Care Doctor: °- Call [facility]  832-8000 - can help you locate a primary care doctor that  accepts your insurance, provides certain services, etc. °- Physician Referral Service- 1-800-533-3463 ° °Agencies that provide inexpensive medical care: °- [facility]  [phone] °- [facility]  [phone] °- [facility]  [phone] °- [facility]  832-4777 °- [facility]  [phone] °- [facility]  [phone] ° °Medicaid-accepting [place] Providers: °- [facility]- [address] ° [phone], Mon-Fri 9am-7pm, Sat 9am-1pm °- [facility]- [address], [address] ° [phone] °- [facility]- [address], [address] ° [phone] °- [facility]- [address] [place] [address] ° [phone] °- [facility]- [address], [address], [phone] ° Only accepts [place] Medicaid patients after they have their name  applied to their card ° °Self Pay (no insurance) in [place]: °- Sickle Cell Patients: Dr Eric Dean, [facility] °   [address], [phone] °- [facility]- [address] ° [phone] °      -     [facility]- [address] °      -     [facility]- see information above (Speak to Pam H if you do not have insurance) °      -  [facility]- [address], [phone] °      -  [facility] [place]- [address],  [phone] °      -  [facility]- [address] [place] [address],  [phone] °      -  Dr Osei-Bonsu-  [address], [place], [phone] °      -  [facility]- [address], [phone] °      -  [facility]- [address] [place] [address], [phone], also [address], [phone] °      -    [facility]- [address], [phone], 1st & 3rd Saturday   every month, 10am-1pm ° °1) Find a Doctor and Pay Out of Pocket °Although you won't have to find out who is covered by your insurance plan, it is a good idea to ask around and get recommendations. You will then need to call the office and see if the doctor you have chosen will accept you as a new patient and what types of options they offer for patients who are self-pay. Some doctors offer discounts or will set up payment plans for their patients who do not have insurance, but you will need to ask so you aren't surprised when you get to your appointment. ° °2) Contact Your Local Health Department °Not all health departments have doctors that can see patients for sick visits, but many do, so it is worth a call to see if yours does. If you don't know where your local health department is, you can check in your phone book. The CDC also has a tool to help you locate your state's health department, and many state websites also have listings of all of their local health departments. ° °3) Find a Walk-in Clinic °If your illness is not likely to be very severe or complicated, you may want to try a walk in clinic. These are popping up all over the country in pharmacies, drugstores, and shopping centers. They're usually staffed by nurse practitioners or physician assistants that have been trained to treat common illnesses and complaints. They're usually fairly quick and inexpensive. However, if you have serious medical issues or chronic medical problems, these are probably not your best option ° °STD Testing °- [facility] [place], [facility], [address], [place], phone [phone] or 1-877-539-9860.  Monday - Friday, call for an appointment. °- [place] [place], [facility], [address], [place], phone [phone] or 1-877-539-9860.  Monday - Friday, call for an appointment. ° °Abuse/Neglect: °- [place] Child Abuse Hotline (336) 641-3795 °- [place] Child Abuse Hotline 800-378-5315 (After Hours) ° °Emergency Shelter:  [facility] (336) 271-5985 ° °Maternity Homes: °- [facility] (336) 275-9566 °- [facility] (704) 372-4663 ° °MRSA Hotline #:   832-7006 ° °[place] Resources ° °[facility] [place]  [employer] [facility] [facility] °[address]                 [address]         [address]  °[place]                                               [place]                              [place] °Phone:  349-3220                                  Phone:  [phone]                   Phone:  342-8140 ° °[facility], 342-8316 °- [place] [facility] - [facility]- 1-888-581-9988 °      -     [facility] in [place], [address],                                  336-349-4454, Insurance ° °[place] Child Abuse Hotline °(336) 342-1394 or (336) 342-3537 (After Hours) ° ° °Behavioral Health Services ° °Substance Abuse Resources: °- [facility]  336-882-2125 °- [facility] 336-784-9470 °- [facility] 336-285-9073 °- [facility] 336-845-3988 °- Residential & Outpatient Substance Abuse Program  800-659-3381 ° °Psychological Services: °- [facility]  832-9600 °- [facility]  378-7881 °- [facility], [address], [place], ACCESS LINE: 1-800-853-5163 or 336-641-4981, Http://www.[facility].com/services/adult.htm ° °Dental Assistance ° °If unable to pay or  uninsured, contact:  [facility] or [facility] to become qualified for the adult dental clinic. ° °Patients with Medicaid: [facility] °[address], [phone] °[address], [phone] ° °If unable to pay, or uninsured, contact [facility] (271-5999) or [place] (641-3152 in [place], 842-7733 in [place]) to become qualified for the adult dental clinic ° °Other Low-Cost Community Dental Services: °- [facility]- [address], [place], [place], [address], 723-1848, Ext. [address], 2nd and 4th Thursday of the month at 6:30am.  10 clients each day by appointment, can sometimes see walk-in patients if someone does not show for an appointment. °- [facility]- [address], [place], [place], [address], [phone] °- [facility]- [address], [place], , [address], [phone] °- [facility]- 342-8273 °- [facility]- [phone] °- [facility]- 570-6415 ° ° ° ° ° ° °
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
## 2014-12-16 ENCOUNTER — Encounter: Payer: Self-pay | Admitting: Family Medicine

## 2014-12-16 ENCOUNTER — Ambulatory Visit (INDEPENDENT_AMBULATORY_CARE_PROVIDER_SITE_OTHER): Payer: Medicare HMO | Admitting: Family Medicine

## 2014-12-16 VITALS — BP 122/74 | HR 63 | Temp 98.3°F | Resp 14 | Ht 74.0 in | Wt 166.0 lb

## 2014-12-16 DIAGNOSIS — Z23 Encounter for immunization: Secondary | ICD-10-CM | POA: Diagnosis not present

## 2014-12-16 DIAGNOSIS — Z0000 Encounter for general adult medical examination without abnormal findings: Secondary | ICD-10-CM

## 2014-12-16 MED ORDER — NAPROXEN 500 MG PO TABS: 500.0000 mg | ORAL_TABLET | Freq: Two times a day (BID) | ORAL | Status: DC

## 2014-12-16 NOTE — Progress Notes (Signed)
Patient ID: Steven Lowery, male   DOB: Dec 24, 1945, 69 y.o.   MRN: [id number]   Edel Rivero, is a 69 y.o. male  DDU:[id number]  CBJ:[id number]  DOB - [birth year]  CC: No chief complaint on file.      HPI: Laker Thompson is a 69 y.o. male here for follow-up hypertension and health maintenance items. He was seen about a month ago, had baseline labs and was started on lisinopril 20 mg. He does have some health maintenance overdue. He needs a tetanus, pneumonia and possibly shingles vaccine. He has at some point been screened for HIV but not HEp C.. He is due for a colonoscopy.  He denies current tobacco or alcohol use. Stopped both about 4 months ago. His major complaint is chronic cervical and lumbar back pain.I prescribed Tramadol recently which has not helped significantly. He has been to ED since that time and was prescribed naproxen which he reports is helping more. The ED doctor apparently mentioned that he might be helped by a brace and he is interested in that.  No Known Allergies Past Medical History  Diagnosis Date  . Hypertension   . Acute asthmatic bronchitis   . Cigarette smoker   . Coronary artery disease   . IBS (irritable bowel syndrome)   . Benign prostatic hypertrophy   . DJD (degenerative joint disease)   . Neck pain   . Back pain   . Psychiatric disorder   . Alcohol abuse    Current Outpatient Prescriptions on File Prior to Visit  Medication Sig Dispense Refill  . Acetaminophen (TYLENOL ARTHRITIS PAIN PO) Take 1 tablet by mouth every 6 (six) hours as needed (arthritis).    [name] aspirin EC 81 MG tablet Take 1 tablet (81 mg total) by mouth daily. 90 tablet 3  . Camphor-Eucalyptus-Menthol (ICY HOT NO-MESS VAPOR GEL EX) Apply 1 application topically daily as needed (hip pain).    [name] dextromethorphan-guaiFENesin (MUCINEX DM) 30-600 MG per 12 hr tablet Take 1 tablet by mouth 2 (two) times daily. (Patient not taking: Reported on 11/14/2014) 30 tablet 0  . fish oil-omega-3 fatty  acids 1000 MG capsule Take 1 g by mouth daily.     [name] levofloxacin (LEVAQUIN) 500 MG tablet Take 1 tablet (500 mg total) by mouth daily. (Patient not taking: Reported on 11/14/2014) 7 tablet 0  . lisinopril (PRINIVIL,ZESTRIL) 20 MG tablet Take 1 tablet (20 mg total) by mouth daily. 90 tablet 3  . Multiple Vitamin (MULTIVITAMIN WITH MINERALS) TABS tablet Take 1 tablet by mouth daily.    . naproxen (NAPROSYN) 500 MG tablet Take 1 tablet (500 mg total) by mouth 2 (two) times daily with a meal. 30 tablet 0  . nitroGLYCERIN (NITROSTAT) 0.4 MG SL tablet Place 1 tablet (0.4 mg total) under the tongue every 5 (five) minutes as needed (If you have possible cardiac chest pain not relieved by 3 NTG, call EMS.). For chest pain 30 tablet 1  . OVER THE COUNTER MEDICATION Place 1 drop into both eyes daily as needed (Eye burning).    . sildenafil (VIAGRA) 100 MG tablet Take 1 tablet (100 mg total) by mouth daily as needed for erectile dysfunction. (Patient not taking: Reported on 11/14/2014) 10 tablet 0  . sodium chloride (OCEAN) 0.65 % SOLN nasal spray Place 1 spray into both nostrils as needed for congestion. (Patient not taking: Reported on 11/14/2014) 30 mL 0  . traMADol (ULTRAM) 50 MG tablet Take 1 tablet (50 mg total) by mouth every 8 (  eight) hours as needed. 30 tablet 0  . vitamin C (ASCORBIC ACID) 500 MG tablet Take 500 mg by mouth daily.     No current facility-administered medications on file prior to visit.   No family history on file. Social History   Social History  . Marital Status: Married    Spouse Name: N/A  . Number of Children: N/A  . Years of Education: N/A   Occupational History  . Not on file.   Social History Main Topics  . Smoking status: Current Every Day Smoker -- 0.30 packs/day for 40 years    Types: Cigarettes  . Smokeless tobacco: Never Used     Comment: has cut back alot per pt  . Alcohol Use: Yes  . Drug Use: No  . Sexual Activity: Not on file   Other Topics Concern  .  Not on file   Social History Narrative    Review of Systems: Constitutional: Negative for fever, chills, appetite change, weight loss,  fatigue. Neck: Positive  for pain, stiffness Respiratory: Negative for cough, shortness of breath,   Cardiovascular: Negative for chest pain, palpitations and leg swelling. Gastrointestinal: Negative for abdominal distention, abdominal pain, nausea, vomiting, diarrhea, constipations Musculoskeletal: Positive  for back pain, neck painNegative for weakness. Neurological: Negative for dizziness, tremors, seizures, syncope,   light-headedness, numbness and headaches.  Psychiatric/Behavioral: Negative for depression, anxiety, decreased concentration, confusion    Objective:  There were no vitals filed for this visit.  Physical Exam: Constitutional: Patient appears well-developed and well-nourished. No distress. HENT: Normocephalic, atraumatic, External right and left ear normal. Oropharynx is clear and moist.  Eyes: Conjunctivae and EOM are normal. PERRLA, no scleral icterus. Neck: Normal ROM. Neck supple. No lymphadenopathy, No thyromegaly. CVS: RRR, S1/S2 +, no murmurs, no gallops, no rubs Pulmonary: Effort and breath sounds normal, no stridor, rhonchi, wheezes, rales.  Abdominal: Soft. Normoactive BS,, no distension, tenderness, rebound or guarding.  Musculoskeletal: Normal range of motion. No edema and no tenderness.  Neuro: Alert.Normal muscle tone coordination. Non-focal Skin: Skin is warm and dry. No rash noted. Not diaphoretic. No erythema. No pallor. Psychiatric: Normal mood and affect. Behavior, judgment, thought content normal.  Lab Results  Component Value Date   WBC 5.1 12/08/2014   HGB 13.5 12/08/2014   HCT 38.6* 12/08/2014   MCV 87.3 12/08/2014   PLT 245 12/08/2014   Lab Results  Component Value Date   CREATININE 1.19 12/08/2014   BUN 13 12/08/2014   NA 138 12/08/2014   K 4.1 12/08/2014   CL 105 12/08/2014   CO2 25 12/08/2014     Lab Results  Component Value Date   HGBA1C 5.4 09/18/2011   Lipid Panel     Component Value Date/Time   CHOL 146 11/14/2014 1121   TRIG 54 11/14/2014 1121   HDL 62 11/14/2014 1121   CHOLHDL 2.4 11/14/2014 1121   VLDL 11 11/14/2014 1121   LDLCALC 73 11/14/2014 1121       Assessment and plan:   Hypertension, well controlled -continue lisinopril 20 mg daily --continue low salt diet  Health maintenance -Tdap given today - Have asked to talk with his insurance company about coverage for shingles and pneumonis -Have congratulated him on his tobacco and alcohol cessation. -Will order colonoscopy.  Chronic Back and neck pain -referral to ortho   Weight loss -follow-up for weight check in one week with nurse.  The patient was given clear instructions to go to ER or return to medical center if symptoms  don't improve, worsen or new problems develop. The patient verbalized understanding. The patient was told to call to get lab results if they haven't heard anything in the next week.       Micheline Chapman, MSN, FNP-BC   12/16/2014, 10:03 AM

## 2014-12-17 LAB — HEPATITIS C ANTIBODY: HCV Ab: NEGATIVE

## 2014-12-20 LAB — VITAMIN D 1,25 DIHYDROXY
VITAMIN D 1, 25 (OH) TOTAL: 77 pg/mL — AB (ref 18–72)
Vitamin D2 1, 25 (OH)2: <8 pg/mL
Vitamin D3 1, 25 (OH)2: 77 pg/mL

## 2015-01-04 ENCOUNTER — Telehealth: Payer: Self-pay | Admitting: Family Medicine

## 2015-01-04 NOTE — Telephone Encounter (Signed)
Patient left message complaining of slow flowing urine.

## 2015-01-05 NOTE — Telephone Encounter (Signed)
Called patient, he was not available at the time. Will try later.

## 2015-01-10 NOTE — Telephone Encounter (Signed)
Tried to call, no answer and could not leave voicemail .Thanks!

## 2015-01-16 ENCOUNTER — Ambulatory Visit: Payer: Self-pay

## 2015-01-16 ENCOUNTER — Ambulatory Visit: Payer: Medicare HMO

## 2015-01-16 VITALS — Wt 166.0 lb

## 2015-01-16 DIAGNOSIS — I10 Essential (primary) hypertension: Secondary | ICD-10-CM

## 2015-01-19 ENCOUNTER — Ambulatory Visit: Payer: Medicare HMO | Admitting: Family Medicine

## 2015-01-31 ENCOUNTER — Ambulatory Visit: Payer: Medicare HMO | Admitting: Family Medicine

## 2015-02-07 ENCOUNTER — Ambulatory Visit (INDEPENDENT_AMBULATORY_CARE_PROVIDER_SITE_OTHER): Payer: Commercial Managed Care - HMO | Admitting: Family Medicine

## 2015-02-07 VITALS — BP 138/81 | HR 61 | Temp 98.2°F | Resp 14 | Ht 74.0 in | Wt 161.0 lb

## 2015-02-07 DIAGNOSIS — F17200 Nicotine dependence, unspecified, uncomplicated: Secondary | ICD-10-CM | POA: Diagnosis not present

## 2015-02-07 DIAGNOSIS — I10 Essential (primary) hypertension: Secondary | ICD-10-CM

## 2015-02-07 DIAGNOSIS — R39198 Other difficulties with micturition: Secondary | ICD-10-CM | POA: Diagnosis not present

## 2015-02-07 DIAGNOSIS — N400 Enlarged prostate without lower urinary tract symptoms: Secondary | ICD-10-CM | POA: Diagnosis not present

## 2015-02-07 LAB — POCT URINALYSIS DIP (DEVICE)
Bilirubin Urine: NEGATIVE
GLUCOSE, UA: NEGATIVE mg/dL
Ketones, ur: NEGATIVE mg/dL
LEUKOCYTES UA: NEGATIVE
NITRITE: NEGATIVE
Protein, ur: NEGATIVE mg/dL
Specific Gravity, Urine: 1.015 (ref 1.005–1.030)
UROBILINOGEN UA: 0.2 mg/dL (ref 0.0–1.0)
pH: 5.5 (ref 5.0–8.0)

## 2015-02-07 LAB — BASIC METABOLIC PANEL
BUN: 13 mg/dL (ref 7–25)
CHLORIDE: 103 mmol/L (ref 98–110)
CO2: 28 mmol/L (ref 20–31)
Calcium: 10 mg/dL (ref 8.6–10.3)
Creat: 0.99 mg/dL (ref 0.70–1.25)
Glucose, Bld: 105 mg/dL — ABNORMAL HIGH (ref 65–99)
POTASSIUM: 4 mmol/L (ref 3.5–5.3)
Sodium: 139 mmol/L (ref 135–146)

## 2015-02-07 NOTE — Progress Notes (Signed)
Subjective:    Patient ID: Steven Lowery, male    DOB: 1946/04/17, 69 y.o.   MRN: [id number]  HPI Steven Lowery, a 69 year old male with a history of hypertension and degenerative joint disease presents complaining of difficulty starting a urine stream. He also reports urinary urgency for the past month. Patient complains of lower urinary tract.  symptoms.He denies a family history of prostate cancer.   He denies frequency and straining, dysuria, urinary or fecal incontinence.  He reports a  history of erectile dysfunction, he has taken viagra in the past.    He also reports a history of hypertension. He has not been taking Lisinopril daily for the past several months. He states that he follows a low sodium diet, but does not exercise. He also does not check blood pressures at home. He currently denies dizziness, headache, fatigue, chest pains, or near-syncope.   Past Medical History  Diagnosis Date  . Hypertension   . Acute asthmatic bronchitis   . Cigarette smoker   . Coronary artery disease   . IBS (irritable bowel syndrome)   . Benign prostatic hypertrophy   . DJD (degenerative joint disease)   . Neck pain   . Back pain   . Psychiatric disorder   . Alcohol abuse    Social History   Social History  . Marital Status: Married    Spouse Name: N/A  . Number of Children: N/A  . Years of Education: N/A   Occupational History  . Not on file.   Social History Main Topics  . Smoking status: Current Every Day Smoker -- 0.30 packs/day for 40 years    Types: Cigarettes  . Smokeless tobacco: Never Used     Comment: has cut back alot per pt  . Alcohol Use: Yes  . Drug Use: No  . Sexual Activity: Not on file   Other Topics Concern  . Not on file   Social History Narrative   Immunization History  Administered Date(s) Administered  . Influenza Split 01/16/2011, 04/27/2012, 07/16/2013  . Tdap 12/16/2014  No Known Allergies Review of Systems  Constitutional: Negative.    HENT: Negative.   Eyes: Negative.   Respiratory: Negative.   Cardiovascular: Negative.   Gastrointestinal: Negative.  Negative for diarrhea, constipation, blood in stool and rectal pain.  Endocrine: Negative.  Negative for polydipsia, polyphagia and polyuria.  Genitourinary: Positive for difficulty urinating. Negative for hematuria and testicular pain.  Musculoskeletal: Negative.   Skin: Negative.   Allergic/Immunologic: Negative.   Neurological: Negative for dizziness, weakness and headaches.  Hematological: Negative.   Psychiatric/Behavioral: Negative.  Negative for suicidal ideas.      Objective:   Physical Exam  Constitutional: He is oriented to person, place, and time. He appears well-developed and well-nourished.  HENT:  Head: Normocephalic and atraumatic.  Right Ear: External ear normal.  Left Ear: External ear normal.  Mouth/Throat: Oropharynx is clear and moist.  Eyes: Conjunctivae and EOM are normal. Pupils are equal, round, and reactive to light.  Neck: Normal range of motion. Neck supple.  Cardiovascular: Normal rate, regular rhythm, normal heart sounds and intact distal pulses.   Pulmonary/Chest: Effort normal and breath sounds normal.  Abdominal: Soft. Bowel sounds are normal.  Genitourinary: Rectal exam shows no fissure, no tenderness and anal tone normal. Prostate is enlarged and tender.  Musculoskeletal: Normal range of motion.  Neurological: He is alert and oriented to person, place, and time. He has normal reflexes.  Skin: Skin is  warm and dry.  Psychiatric: He has a normal mood and affect. His behavior is normal. Judgment and thought content normal.     BP 138/81 mmHg  Pulse 61  Temp(Src) 98.2 F (36.8 C) (Oral)  Resp 14  Ht 6\' 2"  (1.88 m)  Wt 161 lb (73.029 kg)  BMI 20.66 kg/m2 Assessment & Plan:  1. BPH (benign prostatic hyperplasia) AUA score is 4, indicative of mild BPH. Reviewed previous urinalysis and BMP. No proteinuria or elevated creatinine  level. Reminded Steven Lowery to limit fluids before bedtime, avoid caffeine or alcohol - PSA - tamsulosin (FLOMAX) 0.4 MG CAPS capsule; Take 1 capsule (0.4 mg total) by mouth daily.  Dispense: 30 capsule; Refill: 3  2. Decreased urine stream - POCT urinalysis dip (device) - POCT urinalysis dipstick - Basic Metabolic Panel  3. Enlarged prostate - POCT urinalysis dipstick - Basic Metabolic Panel - PSA  4. Essential hypertension Blood pressure is at goal without medication. He reports that he stopped taking Lisinopril several months ago. The patient is asked to make an attempt to improve diet and exercise patterns to aid in medical management of this problem.  5. Tobacco dependence Smoking cessation instruction/counseling given:  counseled patient on the dangers of tobacco use, advised patient to stop smoking, and reviewed strategies to maximize success   RTC: 1 month The patient was given clear instructions to go to ER or return to medical center if symptoms do not improve, worsen or new problems develop. The patient verbalized understanding. Will notify patient with laboratory results. Dorena Dew, FNP

## 2015-02-08 ENCOUNTER — Encounter: Payer: Self-pay | Admitting: Family Medicine

## 2015-02-08 DIAGNOSIS — R39198 Other difficulties with micturition: Secondary | ICD-10-CM | POA: Insufficient documentation

## 2015-02-08 DIAGNOSIS — N400 Enlarged prostate without lower urinary tract symptoms: Secondary | ICD-10-CM | POA: Insufficient documentation

## 2015-02-08 LAB — PSA: PSA: 1.4 ng/mL (ref <=4.00)

## 2015-02-08 MED ORDER — TAMSULOSIN HCL 0.4 MG PO CAPS: 0.4000 mg | ORAL_CAPSULE | Freq: Every day | ORAL | Status: DC

## 2015-02-08 NOTE — Patient Instructions (Signed)
Benign Prostatic Hypertrophy The prostate gland is part of the reproductive system of men. A normal prostate is about the size and shape of a walnut. The prostate gland produces a fluid that is mixed with sperm to make semen. This gland surrounds the urethra and is located in front of the rectum and just below the bladder. The bladder is where urine is stored. The urethra is the tube through which urine passes from the bladder to get out of the body. The prostate grows as a man ages. An enlarged prostate not caused by cancer is called benign prostatic hypertrophy (BPH). An enlarged prostate can press on the urethra. This can make it harder to pass urine. In the early stages of enlargement, the bladder can get by with a narrowed urethra by forcing the urine through. If the problem gets worse, medical or surgical treatment may be required.  This condition should be followed by your health care provider. The accumulation of urine in the bladder can cause infection. Back pressure and infection can progress to bladder damage and kidney (renal) failure. If needed, your health care provider may refer you to a specialist in kidney and prostate disease (urologist). CAUSES  BPH is a common health problem in men older than 50 years. This condition is a normal part of aging. However, not all men will develop problems from this condition. If the enlargement grows away from the urethra, then there will not be any compression of the urethra and resistance to urine flow.If the growth is toward the urethra and compresses it, you will experience difficulty urinating.  SYMPTOMS   Not able to completely empty your bladder.  Getting up often during the night to urinate.  Need to urinate frequently during the day.  Difficultly starting urine flow.  Decrease in size and strength of your urine stream.  Dribbling after urination.  Pain on urination (more common with infection).  Inability to pass urine. This needs  immediate treatment.  The development of a urinary tract infection. DIAGNOSIS  These tests will help your health care provider understand your problem:  A thorough history and physical examination.  A urination history, with the number of times you urinate, the amounts of urine, the strength of the urine stream, and the feeling of emptiness or fullness after urinating.  A postvoid bladder scan that measures any amount of urine that may remain in your bladder after you finish urinating.  Digital rectal exam. In a rectal exam, your health care provider checks your prostate by putting a gloved, lubricated finger into your rectum to feel the back of your prostate gland. This exam detects the size of your gland and abnormal lumps or growths.  Exam of your urine (urinalysis).  Prostate specific antigen (PSA) screening. This is a blood test used to screen for prostate cancer.  Rectal ultrasonography. This test uses sound waves to electronically produce a picture of your prostate gland. TREATMENT  Once symptoms begin, your health care provider will monitor your condition. Of the men with this condition, one third will have symptoms that stabilize, one third will have symptoms that improve, and one third will have symptoms that progress in the first year. Mild symptoms may not need treatment. Simple observation and yearly exams may be all that is required. Medicines and surgery are options for more severe problems. Your health care provider can help you make an informed decision for what is best. Two classes of medicines are available for relief of prostate symptoms:  Medicines  that shrink the prostate. This helps relieve symptoms. These medicines take time to work, and it may be months before any improvement is seen.  Uncommon side effects include problems with sexual function.  Medicines to relax the muscle of the prostate. This also relieves the obstruction by reducing any compression on the  urethra.This group of medicines work much faster than those that reduce the size of the prostate gland. Usually, one can experience improvement in days to weeks..  Side effects can include dizziness, fatigue, lightheadedness, and retrograde ejaculation (diminished volume of ejaculate). Several types of surgical treatments are available for relief of prostate symptoms:  Transurethral resection of the prostate (TURP)--In this treatment, an instrument is inserted through opening at the tip of the penis. It is used to cut away pieces of the inner core of the prostate. The pieces are removed through the same opening of the penis. This removes the obstruction and helps get rid of the symptoms.  Transurethral incision (TUIP)--In this procedure, small cuts are made in the prostate. This lessens the prostates pressure on the urethra.  Transurethral microwave thermotherapy (TUMT)--This procedure uses microwaves to create heat. The heat destroys and removes a small amount of prostate tissue.  Transurethral needle ablation (TUNA)--This is a procedure that uses radio frequencies to do the same as TUMT.  Interstitial laser coagulation (ILC)--This is a procedure that uses a laser to do the same as TUMT and TUNA.  Transurethral electrovaporization (TUVP)--This is a procedure that uses electrodes to do the same as the procedures listed above. SEEK MEDICAL CARE IF:   You develop a fever.  There is unexplained back pain.  Symptoms are not helped by medicines prescribed.  You develop side effects from the medicine you are taking.  Your urine becomes very dark or has a bad smell.  Your lower abdomen becomes distended and you have difficulty passing your urine. SEEK IMMEDIATE MEDICAL CARE IF:   You are suddenly unable to urinate. This is an emergency. You should be seen immediately.  There are large amounts of blood or clots in the urine.  Your urinary problems become unmanageable.  You develop  lightheadedness, severe dizziness, or you feel faint.  You develop moderate to severe low back or flank pain.  You develop chills or fever.   This information is not intended to replace advice given to you by your health care provider. Make sure you discuss any questions you have with your health care provider.   Document Released: 04/22/2005 Document Revised: 04/27/2013 Document Reviewed: 11/05/2012 Elsevier Interactive Patient Education [employer].

## 2015-03-10 ENCOUNTER — Emergency Department (HOSPITAL_COMMUNITY): Payer: Commercial Managed Care - HMO

## 2015-03-10 ENCOUNTER — Encounter (HOSPITAL_COMMUNITY): Payer: Self-pay | Admitting: Emergency Medicine

## 2015-03-10 ENCOUNTER — Emergency Department (HOSPITAL_COMMUNITY)
Admission: EM | Admit: 2015-03-10 | Discharge: 2015-03-11 | Disposition: A | Discharge status: Home / Self Care | Payer: Commercial Managed Care - HMO | Attending: Physician Assistant | Admitting: Physician Assistant

## 2015-03-10 DIAGNOSIS — Z720 Tobacco use: Secondary | ICD-10-CM | POA: Diagnosis not present

## 2015-03-10 DIAGNOSIS — I10 Essential (primary) hypertension: Secondary | ICD-10-CM | POA: Diagnosis not present

## 2015-03-10 DIAGNOSIS — Y998 Other external cause status: Secondary | ICD-10-CM | POA: Insufficient documentation

## 2015-03-10 DIAGNOSIS — S4991XA Unspecified injury of right shoulder and upper arm, initial encounter: Secondary | ICD-10-CM | POA: Diagnosis not present

## 2015-03-10 DIAGNOSIS — Z79899 Other long term (current) drug therapy: Secondary | ICD-10-CM | POA: Diagnosis not present

## 2015-03-10 DIAGNOSIS — I2510 Atherosclerotic heart disease of native coronary artery without angina pectoris: Secondary | ICD-10-CM | POA: Diagnosis not present

## 2015-03-10 DIAGNOSIS — W1839XA Other fall on same level, initial encounter: Secondary | ICD-10-CM | POA: Diagnosis not present

## 2015-03-10 DIAGNOSIS — Y9389 Activity, other specified: Secondary | ICD-10-CM | POA: Diagnosis not present

## 2015-03-10 DIAGNOSIS — R55 Syncope and collapse: Secondary | ICD-10-CM | POA: Diagnosis present

## 2015-03-10 DIAGNOSIS — Y9289 Other specified places as the place of occurrence of the external cause: Secondary | ICD-10-CM | POA: Insufficient documentation

## 2015-03-10 DIAGNOSIS — S4992XA Unspecified injury of left shoulder and upper arm, initial encounter: Secondary | ICD-10-CM | POA: Insufficient documentation

## 2015-03-10 LAB — URINALYSIS, ROUTINE W REFLEX MICROSCOPIC
BILIRUBIN URINE: NEGATIVE
Glucose, UA: NEGATIVE mg/dL
Ketones, ur: NEGATIVE mg/dL
LEUKOCYTES UA: NEGATIVE
Nitrite: NEGATIVE
PROTEIN: NEGATIVE mg/dL
Specific Gravity, Urine: 1.014 (ref 1.005–1.030)
UROBILINOGEN UA: 1 mg/dL (ref 0.0–1.0)
pH: 6 (ref 5.0–8.0)

## 2015-03-10 LAB — BASIC METABOLIC PANEL
ANION GAP: 11 (ref 5–15)
BUN: 8 mg/dL (ref 6–20)
CALCIUM: 10 mg/dL (ref 8.9–10.3)
CO2: 28 mmol/L (ref 22–32)
Chloride: 102 mmol/L (ref 101–111)
Creatinine, Ser: 1.07 mg/dL (ref 0.61–1.24)
GFR calc non Af Amer: >60 mL/min (ref >60)
Glucose, Bld: 138 mg/dL — ABNORMAL HIGH (ref 65–99)
Potassium: 3.9 mmol/L (ref 3.5–5.1)
Sodium: 141 mmol/L (ref 135–145)

## 2015-03-10 LAB — CBC
HEMATOCRIT: 38.7 % — AB (ref 39.0–52.0)
HEMOGLOBIN: 13 g/dL (ref 13.0–17.0)
MCH: 30 pg (ref 26.0–34.0)
MCHC: 33.6 g/dL (ref 30.0–36.0)
MCV: 89.2 fL (ref 78.0–100.0)
Platelets: 199 10*3/uL (ref 150–400)
RBC: 4.34 MIL/uL (ref 4.22–5.81)
RDW: 12.9 % (ref 11.5–15.5)
WBC: 4.5 10*3/uL (ref 4.0–10.5)

## 2015-03-10 LAB — URINE MICROSCOPIC-ADD ON

## 2015-03-10 LAB — I-STAT TROPONIN, ED: TROPONIN I, POC: 0 ng/mL (ref 0.00–0.08)

## 2015-03-10 MED ORDER — CYCLOBENZAPRINE HCL 10 MG PO TABS
10.0000 mg | ORAL_TABLET | Freq: Once | ORAL | Status: AC
  Administered 2015-03-10: 10 mg via ORAL
  Filled 2015-03-10: qty 1

## 2015-03-10 MED ORDER — IBUPROFEN 800 MG PO TABS
800.0000 mg | ORAL_TABLET | Freq: Once | ORAL | Status: AC
  Administered 2015-03-10: 800 mg via ORAL
  Filled 2015-03-10: qty 1

## 2015-03-10 MED ORDER — IOHEXOL 300 MG/ML  SOLN
100.0000 mL | Freq: Once | INTRAMUSCULAR | Status: AC | PRN
  Administered 2015-03-10: 100 mL via INTRAVENOUS

## 2015-03-10 NOTE — ED Provider Notes (Signed)
CSN: [id number]     Arrival date & time 03/10/15  1745 History   First MD Initiated Contact with Patient 03/10/15 2008     Chief Complaint  Patient presents with  . Fall     (Consider location/radiation/quality/duration/timing/severity/associated sxs/prior Treatment) HPI   Patient is a very pleasant 69 year old male presenting after fall. Patient was stooling when his knife wife knocked on the door into that she needs bathroom so he got up very quickly and left the bathroom. Then he had feelings of sweating and nausea and lightheadedness. He lowered himself to the ground. He is complaining that he might have hit his head. Has a mild headache and muscle pain in his neck. Patient did not loose consciousness. Patient does not have any chest pain.  Past Medical History  Diagnosis Date  . Hypertension   . Acute asthmatic bronchitis   . Cigarette smoker   . Coronary artery disease   . IBS (irritable bowel syndrome)   . Benign prostatic hypertrophy   . DJD (degenerative joint disease)   . Neck pain   . Back pain   . Psychiatric disorder   . Alcohol abuse    Past Surgical History  Procedure Laterality Date  . Cystoscopy  1999    Dr. Janice Norrie   History reviewed. No pertinent family history. Social History  Substance Use Topics  . Smoking status: Current Every Day Smoker -- 0.30 packs/day for 40 years    Types: Cigarettes  . Smokeless tobacco: Never Used     Comment: has cut back alot per pt  . Alcohol Use: Yes    Review of Systems  Constitutional: Negative for fever and activity change.  HENT: Negative for congestion.   Eyes: Negative for discharge and redness.  Respiratory: Negative for cough and shortness of breath.   Cardiovascular: Negative for chest pain.  Gastrointestinal: Negative for abdominal pain.  Genitourinary: Negative for dysuria and urgency.  Musculoskeletal: Negative for arthralgias.  Allergic/Immunologic: Negative for immunocompromised state.  Neurological:  Positive for light-headedness. Negative for seizures and speech difficulty.  Psychiatric/Behavioral: Negative for behavioral problems and agitation.  All other systems reviewed and are negative.     Allergies  Review of patient's allergies indicates no known allergies.  Home Medications   Prior to Admission medications   Medication Sig Start Date End Date Taking? Authorizing Provider  Acetaminophen (TYLENOL ARTHRITIS PAIN PO) Take 1 tablet by mouth every 6 (six) hours as needed (arthritis).   Yes Historical Provider, MD  aspirin EC 81 MG tablet Take 1 tablet (81 mg total) by mouth daily. 11/14/14  Yes Micheline Chapman, NP  Camphor-Eucalyptus-Menthol (ICY HOT NO-MESS VAPOR GEL EX) Apply 1 application topically daily as needed (hip pain).   Yes Historical Provider, MD  fish oil-omega-3 fatty acids 1000 MG capsule Take 1 g by mouth daily.    Yes Historical Provider, MD  Multiple Vitamin (MULTIVITAMIN WITH MINERALS) TABS tablet Take 1 tablet by mouth daily.   Yes Historical Provider, MD  naproxen (NAPROSYN) 500 MG tablet Take 1 tablet (500 mg total) by mouth 2 (two) times daily with a meal. 12/16/14  Yes Micheline Chapman, NP  OVER THE COUNTER MEDICATION Place 1 drop into both eyes daily as needed (Eye burning).   Yes Historical Provider, MD  sodium chloride (OCEAN) 0.65 % SOLN nasal spray Place 1 spray into both nostrils as needed for congestion. 06/03/14  Yes Dahlia Bailiff, PA-C  tamsulosin (FLOMAX) 0.4 MG CAPS capsule Take 1 capsule (0.4 mg  total) by mouth daily. 02/08/15  Yes Dorena Dew, FNP  traMADol (ULTRAM) 50 MG tablet Take 1 tablet (50 mg total) by mouth every 8 (eight) hours as needed. 11/14/14  Yes Micheline Chapman, NP  vitamin C (ASCORBIC ACID) 500 MG tablet Take 500 mg by mouth daily.   Yes Historical Provider, MD  dextromethorphan-guaiFENesin (MUCINEX DM) 30-600 MG per 12 hr tablet Take 1 tablet by mouth 2 (two) times daily. Patient not taking: Reported on 11/14/2014 06/03/14   Dahlia Bailiff, PA-C  lisinopril (PRINIVIL,ZESTRIL) 20 MG tablet Take 1 tablet (20 mg total) by mouth daily. Patient not taking: Reported on 02/07/2015 11/14/14   Micheline Chapman, NP  nitroGLYCERIN (NITROSTAT) 0.4 MG SL tablet Place 1 tablet (0.4 mg total) under the tongue every 5 (five) minutes as needed (If you have possible cardiac chest pain not relieved by 3 NTG, call EMS.). For chest pain 11/14/14   Micheline Chapman, NP  sildenafil (VIAGRA) 100 MG tablet Take 1 tablet (100 mg total) by mouth daily as needed for erectile dysfunction. Patient not taking: Reported on 11/14/2014 10/27/12   Noralee Space, MD   BP 153/63 mmHg  Pulse 83  Temp(Src) 97.5 F (36.4 C) (Oral)  Resp 18  SpO2 97% Physical Exam  Constitutional: He is oriented to person, place, and time. He appears well-nourished.  HENT:  Head: Normocephalic and atraumatic.  Mouth/Throat: Oropharynx is clear and moist.  Old scar  Eyes: Conjunctivae are normal.  Neck: No tracheal deviation present.  Pain in bilateral trapezius  Cardiovascular: Normal rate.   Pulmonary/Chest: Effort normal. No stridor. No respiratory distress.  Abdominal: Soft. There is no tenderness. There is no guarding.  Musculoskeletal: Normal range of motion. He exhibits no edema.  Neurological: He is oriented to person, place, and time. No cranial nerve deficit.  Skin: Skin is warm and dry. No rash noted. He is not diaphoretic.  Psychiatric: He has a normal mood and affect. His behavior is normal.  Nursing note and vitals reviewed.   ED Course  Procedures (including critical care time) Labs Review Labs Reviewed  BASIC METABOLIC PANEL - Abnormal; Notable for the following:    Glucose, Bld 138 (*)    All other components within normal limits  CBC - Abnormal; Notable for the following:    HCT 38.7 (*)    All other components within normal limits  URINALYSIS, ROUTINE W REFLEX MICROSCOPIC (NOT AT [facility]) - Abnormal; Notable for the following:    Hgb urine dipstick  SMALL (*)    All other components within normal limits  URINE CULTURE  URINE MICROSCOPIC-ADD ON  Randolm Idol, ED    Imaging Review Dg Chest 2 View  03/10/2015  CLINICAL DATA:  Patient fell backward in a hallway tonight. Patient posterior head and neck and back pain. EXAM: CHEST  2 VIEW COMPARISON:  12/08/2014 FINDINGS: Cardiac silhouette is normal in size and configuration. Normal mediastinal and hilar contours. Lungs are hyperexpanded. Relative lucency is noted peripherally suggesting emphysema. There is mild stable scarring at the bases. No evidence of pneumonia or edema. No pleural effusion or pneumothorax. Bony thorax is demineralized but intact. IMPRESSION: 1. No acute cardiopulmonary disease. Electronically Signed   By: Lajean Manes M.D.   On: 03/10/2015 21:41   Ct Head Wo Contrast  03/10/2015  CLINICAL DATA:  Fall with posterior head trauma, posterior headache and left posterior neck pain. EXAM: CT HEAD WITHOUT CONTRAST CT CERVICAL SPINE WITHOUT CONTRAST TECHNIQUE: Multidetector CT imaging  of the head and cervical spine was performed following the standard protocol without intravenous contrast. Multiplanar CT image reconstructions of the cervical spine were also generated. COMPARISON:  08/06/2009 head CT.  04/20/2003 cervical spine MRI. FINDINGS: CT HEAD FINDINGS No evidence of parenchymal hemorrhage or extra-axial fluid collection. No mass lesion, mass effect, or midline shift. No CT evidence of acute infarction. There is intracranial atherosclerosis. 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 Cerebral volume is age appropriate. No ventriculomegaly. The visualized paranasal sinuses are essentially clear. The mastoid air cells are unopacified. No evidence of calvarial fracture. CT CERVICAL SPINE FINDINGS No fracture or subluxation is detected in the cervical spine. No prevertebral soft tissue swelling. There is straightening of the cervical spine, usually due to positioning and/or  muscle spasm. Dens is well positioned between the lateral masses of C1. The lateral masses appear well-aligned. Moderate degenerative disc disease in the mid to lower cervical spine, most prominent at C5-6 and C6-7. Minimal facet arthropathy bilaterally in the mid to lower cervical spine. Mild degenerative foraminal stenosis on the right head C3-4 and C4-5. Visualized mastoid air cells appear clear. No evidence of intra-axial hemorrhage in the visualized brain. No gross cervical canal hematoma. Visualized lung apices are clear. No appreciable cervical adenopathy or other cervical spine soft tissue abnormality. IMPRESSION: 1. No evidence of acute intracranial abnormality. No calvarial fracture. 2. No fracture or subluxation in the cervical spine. 3. Intracranial atherosclerosis. 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 4. Moderate degenerative changes in the cervical spine as described . Electronically Signed   By: Ilona Sorrel M.D.   On: 03/10/2015 21:36   Ct Cervical Spine Wo Contrast  03/10/2015  CLINICAL DATA:  Fall with posterior head trauma, posterior headache and left posterior neck pain. EXAM: CT HEAD WITHOUT CONTRAST CT CERVICAL SPINE WITHOUT CONTRAST TECHNIQUE: Multidetector CT imaging of the head and cervical spine was performed following the standard protocol without intravenous contrast. Multiplanar CT image reconstructions of the cervical spine were also generated. COMPARISON:  08/06/2009 head CT.  04/20/2003 cervical spine MRI. FINDINGS: CT HEAD FINDINGS No evidence of parenchymal hemorrhage or extra-axial fluid collection. No mass lesion, mass effect, or midline shift. No CT evidence of acute infarction. There is intracranial atherosclerosis. 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 Cerebral volume is age appropriate. No ventriculomegaly. The visualized paranasal sinuses are essentially clear. The mastoid air cells are unopacified. No evidence of calvarial fracture. CT CERVICAL  SPINE FINDINGS No fracture or subluxation is detected in the cervical spine. No prevertebral soft tissue swelling. There is straightening of the cervical spine, usually due to positioning and/or muscle spasm. Dens is well positioned between the lateral masses of C1. The lateral masses appear well-aligned. Moderate degenerative disc disease in the mid to lower cervical spine, most prominent at C5-6 and C6-7. Minimal facet arthropathy bilaterally in the mid to lower cervical spine. Mild degenerative foraminal stenosis on the right head C3-4 and C4-5. Visualized mastoid air cells appear clear. No evidence of intra-axial hemorrhage in the visualized brain. No gross cervical canal hematoma. Visualized lung apices are clear. No appreciable cervical adenopathy or other cervical spine soft tissue abnormality. IMPRESSION: 1. No evidence of acute intracranial abnormality. No calvarial fracture. 2. No fracture or subluxation in the cervical spine. 3. Intracranial atherosclerosis. 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 4. Moderate degenerative changes in the cervical spine as described . Electronically Signed   By: Ilona Sorrel M.D.   On: 03/10/2015 21:36  Ct Abdomen Pelvis W Contrast  03/10/2015  CLINICAL DATA:  Lower urinary tract symptoms. Flank pain tonight. Fell yesterday. EXAM: CT ABDOMEN AND PELVIS WITH CONTRAST TECHNIQUE: Multidetector CT imaging of the abdomen and pelvis was performed using the standard protocol following bolus administration of intravenous contrast. CONTRAST:  160mL OMNIPAQUE IOHEXOL 300 MG/ML  SOLN COMPARISON:  None. FINDINGS: Lower chest: Minimal linear scarring or atelectasis in the posterior bases. Hepatobiliary: There are a few sub cm hypodense foci in the liver, likely benign cysts but too small for conclusive characterization. No suspicious focal liver lesion is evident. Gallbladder and bile ducts appear unremarkable. Pancreas: Normal Spleen: Normal Adrenals/Urinary Tract: There are  a few tiny hypodense foci in the kidneys, likely benign cysts but too small for conclusive characterization. Otherwise normal adrenals, kidneys and ureters. There is mild generalized urinary bladder mural thickening which is nonspecific. Stomach/Bowel: Stomach, small bowel and colon appear unremarkable. Vascular/Lymphatic: The abdominal aorta is normal in caliber. There is no atherosclerotic calcification. There is no adenopathy in the abdomen or pelvis. Reproductive: Unremarkable Other: No acute inflammatory changes are evident in the abdomen or pelvis. There is no ascites. Musculoskeletal: No significant musculoskeletal lesions. IMPRESSION: There are a few sub cm foci in the liver and kidneys which probably are benign cysts although they are too small for definitive characterization. Otherwise unremarkable except for mild nonspecific urinary bladder mural thickening. This could relate to cystitis. Electronically Signed   By: Andreas Newport M.D.   On: 03/10/2015 23:42   I have personally reviewed and evaluated these images and lab results as part of my medical decision-making.   EKG Interpretation None      MDM   Final diagnoses:  None    Patient is a very pleasant 69 year old male appearing younger than stated age . Patient was constipated in the bathroom and then got up quickly and moved into the hallway. He felt lightheaded and then had near-syncope. It sounds consistent wotj  vasovagal near syncope. Patient had no chest pain. We will get EKG here. We will get urine to make sure he does not have a urinary tract infection. We'll get CT head and C-spine to rule out any kind of intracranial injury. Anticipate negative studies and to give ibuprofen and Flexeril and have patient follow-up with PCP. Patient appears very healthy, normal vital signs and normal physical exam right now.  UA showed hematuria.Since patient complained of mild paraspinal L spine pain, I ordered CT for occult renal trauma.   CT negative and patient still appears very well. Will have him follow upw with urology for repeat UA for heamturia. (painless hematuria)    Colandra Ohanian Julio Alm, MD 03/11/15 0004

## 2015-03-10 NOTE — ED Notes (Signed)
Pt sts after having BM yesterday pt stood up and had near syncope with fall; pt sts pain in head, neck and lower back

## 2015-03-10 NOTE — ED Notes (Signed)
Transported to radiology 

## 2015-03-11 DIAGNOSIS — R55 Syncope and collapse: Secondary | ICD-10-CM | POA: Diagnosis not present

## 2015-03-11 NOTE — ED Notes (Signed)
Pt. Left with all belongings. Discharge instructions were reviewed and all questions were answered.  

## 2015-03-11 NOTE — Discharge Instructions (Signed)
Syncope °Syncope is a medical term for fainting or passing out. This means you lose consciousness and drop to the ground. People are generally unconscious for less than 5 minutes. You may have some muscle twitches for up to 15 seconds before waking up and returning to normal. Syncope occurs more often in older adults, but it can happen to anyone. While most causes of syncope are not dangerous, syncope can be a sign of a serious medical problem. It is important to seek medical care.  °CAUSES  °Syncope is caused by a sudden drop in blood flow to the brain. The specific cause is often not determined. Factors that can bring on syncope include: °· Taking medicines that lower blood pressure. °· Sudden changes in posture, such as standing up quickly. °· Taking more medicine than prescribed. °· Standing in one place for too long. °· Seizure disorders. °· Dehydration and excessive exposure to heat. °· Low blood sugar (hypoglycemia). °· Straining to have a bowel movement. °· Heart disease, irregular heartbeat, or other circulatory problems. °· Fear, emotional distress, seeing blood, or severe pain. °SYMPTOMS  °Right before fainting, you may: °· Feel dizzy or light-headed. °· Feel nauseous. °· See all white or all black in your field of vision. °· Have cold, clammy skin. °DIAGNOSIS  °Your health care provider will ask about your symptoms, perform a physical exam, and perform an electrocardiogram (ECG) to record the electrical activity of your heart. Your health care provider may also perform other heart or blood tests to determine the cause of your syncope which may include: °· Transthoracic echocardiogram (TTE). During echocardiography, sound waves are used to evaluate how blood flows through your heart. °· Transesophageal echocardiogram (TEE). °· Cardiac monitoring. This allows your health care provider to monitor your heart rate and rhythm in real time. °· Holter monitor. This is a portable device that records your  heartbeat and can help diagnose heart arrhythmias. It allows your health care provider to track your heart activity for several days, if needed. °· Stress tests by exercise or by giving medicine that makes the heart beat faster. °TREATMENT  °In most cases, no treatment is needed. Depending on the cause of your syncope, your health care provider may recommend changing or stopping some of your medicines. °HOME CARE INSTRUCTIONS °· Have someone stay with you until you feel stable. °· Do not drive, use machinery, or play sports until your health care provider says it is okay. °· Keep all follow-up appointments as directed by your health care provider. °· Lie down right away if you start feeling like you might faint. Breathe deeply and steadily. Wait until all the symptoms have passed. °· Drink enough fluids to keep your urine clear or pale yellow. °· If you are taking blood pressure or heart medicine, get up slowly and take several minutes to sit and then stand. This can reduce dizziness. °SEEK IMMEDIATE MEDICAL CARE IF:  °· You have a severe headache. °· You have unusual pain in the chest, abdomen, or back. °· You are bleeding from your mouth or rectum, or you have black or tarry stool. °· You have an irregular or very fast heartbeat. °· You have pain with breathing. °· You have repeated fainting or seizure-like jerking during an episode. °· You faint when sitting or lying down. °· You have confusion. °· You have trouble walking. °· You have severe weakness. °· You have vision problems. °If you fainted, call your local emergency services (911 in [place] Do not drive   yourself to the hospital.    This information is not intended to replace advice given to you by your health care provider. Make sure you discuss any questions you have with your health care provider.   Document Released: 04/22/2005 Document Revised: 09/06/2014 Document Reviewed: 06/21/2011 Elsevier Interactive Patient Education 2016 [employer].  Near-Syncope Near-syncope (commonly known as near fainting) is sudden weakness, dizziness, or feeling like you might pass out. This can happen when getting up or while standing for a long time. It is caused by a sudden decrease in blood flow to the brain, which can occur for various reasons. Most of the reasons are not serious.  HOME CARE Watch your condition for any changes.  Have someone stay with you until you feel stable.  If you feel like you are going to pass out:  Lie down right away.  Prop your feet up if you can.  Breathe deeply and steadily.  Move only when the feeling has gone away. Most of the time, this feeling lasts only a few minutes. You may feel tired for several hours.  Drink enough fluids to keep your pee (urine) clear or pale yellow.  If you are taking blood pressure or heart medicine, stand up slowly.  Follow up with your doctor as told. GET HELP RIGHT AWAY IF:   You have a severe headache.  You have unusual pain in the chest, belly (abdomen), or back.  You have bleeding from the mouth or butt (rectum), or you have black or tarry poop (stool).  You feel your heart beat differently than normal, or you have a very fast pulse.  You pass out, or you twitch and shake when you pass out.  You pass out when sitting or lying down.  You feel confused.  You have trouble walking.  You are weak.  You have vision problems. MAKE SURE YOU:   Understand these instructions.  Will watch your condition.  Will get help right away if you are not doing well or get worse.   This information is not intended to replace advice given to you by your health care provider. Make sure you discuss any questions you have with your health care provider.   Document Released: 10/09/2007 Document Revised: 05/13/2014 Document Reviewed: 09/25/2012 Elsevier Interactive Patient Education [employer].

## 2015-03-12 LAB — URINE CULTURE: Culture: 9000

## 2015-03-14 ENCOUNTER — Ambulatory Visit: Payer: Commercial Managed Care - HMO | Admitting: Family Medicine

## 2015-04-04 ENCOUNTER — Ambulatory Visit: Payer: Commercial Managed Care - HMO | Admitting: Family Medicine

## 2015-04-17 ENCOUNTER — Encounter: Payer: Self-pay | Admitting: Family Medicine

## 2015-04-17 ENCOUNTER — Ambulatory Visit (INDEPENDENT_AMBULATORY_CARE_PROVIDER_SITE_OTHER): Payer: Commercial Managed Care - HMO | Admitting: Family Medicine

## 2015-04-17 VITALS — BP 160/83 | HR 66 | Temp 98.4°F | Ht 74.0 in | Wt 172.0 lb

## 2015-04-17 DIAGNOSIS — I10 Essential (primary) hypertension: Secondary | ICD-10-CM

## 2015-04-17 DIAGNOSIS — F17200 Nicotine dependence, unspecified, uncomplicated: Secondary | ICD-10-CM | POA: Diagnosis not present

## 2015-04-17 DIAGNOSIS — R42 Dizziness and giddiness: Secondary | ICD-10-CM

## 2015-04-17 DIAGNOSIS — Z23 Encounter for immunization: Secondary | ICD-10-CM

## 2015-04-17 DIAGNOSIS — R739 Hyperglycemia, unspecified: Secondary | ICD-10-CM

## 2015-04-17 DIAGNOSIS — M542 Cervicalgia: Secondary | ICD-10-CM | POA: Diagnosis not present

## 2015-04-17 DIAGNOSIS — K5900 Constipation, unspecified: Secondary | ICD-10-CM

## 2015-04-17 LAB — POCT URINALYSIS DIP (DEVICE)
Bilirubin Urine: NEGATIVE
Glucose, UA: NEGATIVE mg/dL
KETONES UR: NEGATIVE mg/dL
Leukocytes, UA: NEGATIVE
Nitrite: NEGATIVE
PH: 6 (ref 5.0–8.0)
Protein, ur: NEGATIVE mg/dL
Specific Gravity, Urine: 1.01 (ref 1.005–1.030)
Urobilinogen, UA: 0.2 mg/dL (ref 0.0–1.0)

## 2015-04-17 LAB — TSH: TSH: 0.946 u[IU]/mL (ref 0.350–4.500)

## 2015-04-17 MED ORDER — DOCUSATE SODIUM 100 MG PO CAPS
100.0000 mg | ORAL_CAPSULE | Freq: Every day | ORAL | Status: DC
Start: 2015-04-17 — End: 2015-08-10

## 2015-04-17 MED ORDER — KETOROLAC TROMETHAMINE 60 MG/2ML IM SOLN
60.0000 mg | Freq: Once | INTRAMUSCULAR | Status: AC
  Administered 2015-04-17: 60 mg via INTRAMUSCULAR

## 2015-04-17 MED ORDER — TRAMADOL HCL 50 MG PO TABS: 50.0000 mg | ORAL_TABLET | Freq: Three times a day (TID) | ORAL | Status: DC | PRN

## 2015-04-17 NOTE — Patient Instructions (Signed)
Patient received Toradol 60 mg IM without complication.  Patient will continue Tramadol 50 mg every 8 hours as needed for moderate to severe pain.  Will send a referral to orthopedic specialist for chronic back pain.    Soft Tissue Injury of the Neck A soft tissue injury of the neck may be either blunt or penetrating. A blunt injury does not break the skin. A penetrating injury breaks the skin, creating an open wound. Blunt injuries may happen in several ways. Most involve some type of direct blow to the neck. This can cause serious injury to the windpipe, voice box, cervical spine, or esophagus. In some cases, the injury to the soft tissue can also result in a break (fracture) of the cervical spine.  Soft tissue injuries of the neck require immediate medical care. Sometimes, you may not notice the signs of injury right away. You may feel fine at first, but the swelling may eventually close off your airway. This could result in a significant or life-threatening injury. This is rare, but it is important to keep in mind with any injury to the neck.  CAUSES  Causes of blunt injury may include:  "Clothesline" injuries. This happens when someone is moving at high speed and runs into a clothesline, outstretched arm, or similar object. This results in a direct injury to the front of the neck. If the airway is blocked, it can cause suffocation due to lack of oxygen (asphyxiation) or even instant death.  High-energy trauma. This includes injuries from motor vehicle crashes, falling from a great height, or heavy objects falling onto the neck.  Sports-related injuries. Injury to the windpipe and voice box can result from being struck by another player or being struck by an object, such as a baseball, hockey stick, or an outstretched arm.  Strangulation. This type of injury may cause skin trauma, hoarseness of voice, or broken cartilage in the voice box or windpipe. It may also cause a serious airway  problem. SYMPTOMS   Bruising.  Pain and tenderness in the neck.  Swelling of the neck and face.  Hoarseness of voice.  Pain or difficulty with swallowing.  Drooling or inability to swallow.  Trouble breathing. This may become worse when lying flat.  Coughing up blood.  High-pitched, harsh, vibratory noise due to partial obstruction of the windpipe (stridor).  Swelling of the upper arms.  Windpipe that appears to be pushed off to one side.  Air in the tissues under the skin of the neck or chest (subcutaneous emphysema). This usually indicates a problem with the normal airway and is a medical emergency. DIAGNOSIS   If possible, your caregiver may ask about the details of how the injury occurred. A detailed exam can help to identify specific areas of the neck that are injured.  Your caregiver may ask for tests to rule out injury of the voice box, airway, or esophagus. This may include X-rays, ultrasounds, CT scans, or MRI scans, depending on the severity of your injury. TREATMENT  If you have an injury to your windpipe or voice box, immediate medical care is required. In almost all cases, hospitalization is necessary. For injuries that do not appear to require surgery, it is helpful to have medical observation for 24 hours. You may be asked to do one or more of the following:  Rest your voice.  Bed rest.  Limit your diet, depending on the extent of the injury. Follow your caregiver's dietary guidelines. Often, only fluids and soft foods are  recommended.  Keep your head raised.  Breathe humidified air.  Take medicines to control infection, reduce swelling, and reduce normal stomach acid. You may also need pain medicine, depending on your injury. For injuries that appear to require surgery, you will need to stay in the hospital. The exact type of procedure needed will depend on your exact injury or injuries.  HOME CARE INSTRUCTIONS   If the skin was broken, keep the wound  area clean and dry. Wear your bandage (dressing) and care for your wound as instructed.  Follow your caregiver's advice about your diet.  Follow your caregiver's advice about use of your voice.  Take medicines as directed.  Keep your head and neck at least partially raised (elevated) while recovering. This should also be done while sleeping. SEEK MEDICAL CARE IF:   Your voice becomes weaker.  Your swelling or bruising is not improving as expected. Typically, this takes several days to improve.  You feel that you are having problems with medicines prescribed.  You have drainage from the injury site. This may be a sign that your wound is not healing properly or is infected.  You develop increasing pain or difficulty while swallowing.  You develop an oral temperature of 102 F (38.9 C) or higher. SEEK IMMEDIATE MEDICAL CARE IF:   You cough up blood.  You develop sudden trouble breathing.  You cannot tolerate your oral medicines, or you are unable to swallow.  You develop drooling.  You have new or worsening vomiting.  You develop sudden, new swelling of the neck or face.  You have an oral temperature above 102 F (38.9 C), not controlled by medicine. MAKE SURE YOU:  Understand these instructions.  Will watch your condition.  Will get help right away if you are not doing well or get worse.   This information is not intended to replace advice given to you by your health care provider. Make sure you discuss any questions you have with your health care provider.   Document Released: 07/30/2007 Document Revised: 07/15/2011 Document Reviewed: 11/09/2014 Elsevier Interactive Patient Education 2016 [employer] Chronic Back Pain  When back pain lasts longer than 3 months, it is called chronic back pain.People with chronic back pain often go through certain periods that are more intense (flare-ups).  CAUSES Chronic back pain can be caused by wear and tear (degeneration)  on different structures in your back. These structures include:  The bones of your spine (vertebrae) and the joints surrounding your spinal cord and nerve roots (facets).  The strong, fibrous tissues that connect your vertebrae (ligaments). Degeneration of these structures may result in pressure on your nerves. This can lead to constant pain. HOME CARE INSTRUCTIONS  Avoid bending, heavy lifting, prolonged sitting, and activities which make the problem worse.  Take brief periods of rest throughout the day to reduce your pain. Lying down or standing usually is better than sitting while you are resting.  Take over-the-counter or prescription medicines only as directed by your caregiver. SEEK IMMEDIATE MEDICAL CARE IF:   You have weakness or numbness in one of your legs or feet.  You have trouble controlling your bladder or bowels.  You have nausea, vomiting, abdominal pain, shortness of breath, or fainting.   This information is not intended to replace advice given to you by your health care provider. Make sure you discuss any questions you have with your health care provider.   Document Released: 05/30/2004 Document Revised: 07/15/2011 Document Reviewed: 10/10/2014 Elsevier Interactive  Patient Education 2016 [place] DASH stands for "Dietary Approaches to Stop Hypertension." The DASH eating plan is a healthy eating plan that has been shown to reduce high blood pressure (hypertension). Additional health benefits may include reducing the risk of type 2 diabetes mellitus, heart disease, and stroke. The DASH eating plan may also help with weight loss. WHAT DO I NEED TO KNOW ABOUT THE DASH EATING PLAN? For the DASH eating plan, you will follow these general guidelines:  Choose foods with a percent daily value for sodium of less than 5% (as listed on the food label).  Use salt-free seasonings or herbs instead of table salt or sea salt.  Check with your health care  provider or pharmacist before using salt substitutes.  Eat lower-sodium products, often labeled as "lower sodium" or "no salt added."  Eat fresh foods.  Eat more vegetables, fruits, and low-fat dairy products.  Choose whole grains. Look for the word "whole" as the first word in the ingredient list.  Choose fish and skinless chicken or [place] more often than red meat. Limit fish, poultry, and meat to 6 oz (170 g) each day.  Limit sweets, desserts, sugars, and sugary drinks.  Choose heart-healthy fats.  Limit cheese to 1 oz (28 g) per day.  Eat more home-cooked food and less restaurant, buffet, and fast food.  Limit fried foods.  Cook foods using methods other than frying.  Limit canned vegetables. If you do use them, rinse them well to decrease the sodium.  When eating at a restaurant, ask that your food be prepared with less salt, or no salt if possible. WHAT FOODS CAN I EAT? Seek help from a dietitian for individual calorie needs. Grains Whole grain or whole wheat bread. Brown rice. Whole grain or whole wheat pasta. Quinoa, bulgur, and whole grain cereals. Low-sodium cereals. Corn or whole wheat flour tortillas. Whole grain cornbread. Whole grain crackers. Low-sodium crackers. Vegetables Fresh or frozen vegetables (raw, steamed, roasted, or grilled). Low-sodium or reduced-sodium tomato and vegetable juices. Low-sodium or reduced-sodium tomato sauce and paste. Low-sodium or reduced-sodium canned vegetables.  Fruits All fresh, canned (in natural juice), or frozen fruits. Meat and Other Protein Products Ground beef (85% or leaner), grass-fed beef, or beef trimmed of fat. Skinless chicken or [place]. Ground chicken or [place]. Pork trimmed of fat. All fish and seafood. Eggs. Dried beans, peas, or lentils. Unsalted nuts and seeds. Unsalted canned beans. Dairy Low-fat dairy products, such as skim or 1% milk, 2% or reduced-fat cheeses, low-fat ricotta or cottage cheese, or plain low-fat  yogurt. Low-sodium or reduced-sodium cheeses. Fats and Oils Tub margarines without trans fats. Light or reduced-fat mayonnaise and salad dressings (reduced sodium). Avocado. Safflower, olive, or canola oils. Natural peanut or almond butter. Other Unsalted popcorn and pretzels. The items listed above may not be a complete list of recommended foods or beverages. Contact your dietitian for more options. WHAT FOODS ARE NOT RECOMMENDED? Grains White bread. White pasta. White rice. Refined cornbread. Bagels and croissants. Crackers that contain trans fat. Vegetables Creamed or fried vegetables. Vegetables in a cheese sauce. Regular canned vegetables. Regular canned tomato sauce and paste. Regular tomato and vegetable juices. Fruits Dried fruits. Canned fruit in light or heavy syrup. Fruit juice. Meat and Other Protein Products Fatty cuts of meat. Ribs, chicken wings, bacon, sausage, bologna, salami, chitterlings, fatback, hot dogs, bratwurst, and packaged luncheon meats. Salted nuts and seeds. Canned beans with salt. Dairy Whole or 2% milk, cream, half-and-half, and cream  cheese. Whole-fat or sweetened yogurt. Full-fat cheeses or blue cheese. Nondairy creamers and whipped toppings. Processed cheese, cheese spreads, or cheese curds. Condiments Onion and garlic salt, seasoned salt, table salt, and sea salt. Canned and packaged gravies. Worcestershire sauce. Tartar sauce. Barbecue sauce. Teriyaki sauce. Soy sauce, including reduced sodium. Steak sauce. Fish sauce. Oyster sauce. Cocktail sauce. Horseradish. Ketchup and mustard. Meat flavorings and tenderizers. Bouillon cubes. Hot sauce. Tabasco sauce. Marinades. Taco seasonings. Relishes. Fats and Oils Butter, stick margarine, lard, shortening, ghee, and bacon fat. Coconut, palm kernel, or palm oils. Regular salad dressings. Other Pickles and olives. Salted popcorn and pretzels. The items listed above may not be a complete list of foods and beverages to  avoid. Contact your dietitian for more information. WHERE CAN I FIND MORE INFORMATION? National Heart, Lung, and Blood Institute: travelstabloid.com   This information is not intended to replace advice given to you by your health care provider. Make sure you discuss any questions you have with your health care provider.   Document Released: 04/11/2011 Document Revised: 05/13/2014 Document Reviewed: 02/24/2013 Elsevier Interactive Patient Education 2016 [employer]. Hypertension Hypertension, commonly called high blood pressure, is when the force of blood pumping through your arteries is too strong. Your arteries are the blood vessels that carry blood from your heart throughout your body. A blood pressure reading consists of a higher number over a lower number, such as 110/72. The higher number (systolic) is the pressure inside your arteries when your heart pumps. The lower number (diastolic) is the pressure inside your arteries when your heart relaxes. Ideally you want your blood pressure below 120/80. Hypertension forces your heart to work harder to pump blood. Your arteries may become narrow or stiff. Having untreated or uncontrolled hypertension can cause heart attack, stroke, kidney disease, and other problems. RISK FACTORS Some risk factors for high blood pressure are controllable. Others are not.  Risk factors you cannot control include:   Race. You may be at higher risk if you are African American.  Age. Risk increases with age.  Gender. Men are at higher risk than women before age 46 years. After age 40, women are at higher risk than men. Risk factors you can control include:  Not getting enough exercise or physical activity.  Being overweight.  Getting too much fat, sugar, calories, or salt in your diet.  Drinking too much alcohol. SIGNS AND SYMPTOMS Hypertension does not usually cause signs or symptoms. Extremely high blood pressure  (hypertensive crisis) may cause headache, anxiety, shortness of breath, and nosebleed. DIAGNOSIS To check if you have hypertension, your health care provider will measure your blood pressure while you are seated, with your arm held at the level of your heart. It should be measured at least twice using the same arm. Certain conditions can cause a difference in blood pressure between your right and left arms. A blood pressure reading that is higher than normal on one occasion does not mean that you need treatment. If it is not clear whether you have high blood pressure, you may be asked to return on a different day to have your blood pressure checked again. Or, you may be asked to monitor your blood pressure at home for 1 or more weeks. TREATMENT Treating high blood pressure includes making lifestyle changes and possibly taking medicine. Living a healthy lifestyle can help lower high blood pressure. You may need to change some of your habits. Lifestyle changes may include:  Following the DASH diet. This diet is high  in fruits, vegetables, and whole grains. It is low in salt, red meat, and added sugars.  Keep your sodium intake below 2,300 mg per day.  Getting at least 30-45 minutes of aerobic exercise at least 4 times per week.  Losing weight if necessary.  Not smoking.  Limiting alcoholic beverages.  Learning ways to reduce stress. Your health care provider may prescribe medicine if lifestyle changes are not enough to get your blood pressure under control, and if one of the following is true:  You are 14-65 years of age and your systolic blood pressure is above 140.  You are 44 years of age or older, and your systolic blood pressure is above 150.  Your diastolic blood pressure is above 90.  You have diabetes, and your systolic blood pressure is over [id number] or your diastolic blood pressure is over 90.  You have kidney disease and your blood pressure is above 140/90.  You have heart disease  and your blood pressure is above 140/90. Your personal target blood pressure may vary depending on your medical conditions, your age, and other factors. HOME CARE INSTRUCTIONS  Have your blood pressure rechecked as directed by your health care provider.   Take medicines only as directed by your health care provider. Follow the directions carefully. Blood pressure medicines must be taken as prescribed. The medicine does not work as well when you skip doses. Skipping doses also puts you at risk for problems.  Do not smoke.   Monitor your blood pressure at home as directed by your health care provider. SEEK MEDICAL CARE IF:   You think you are having a reaction to medicines taken.  You have recurrent headaches or feel dizzy.  You have swelling in your ankles.  You have trouble with your vision. SEEK IMMEDIATE MEDICAL CARE IF:  You develop a severe headache or confusion.  You have unusual weakness, numbness, or feel faint.  You have severe chest or abdominal pain.  You vomit repeatedly.  You have trouble breathing. MAKE SURE YOU:   Understand these instructions.  Will watch your condition.  Will get help right away if you are not doing well or get worse.   This information is not intended to replace advice given to you by your health care provider. Make sure you discuss any questions you have with your health care provider.   Document Released: 04/22/2005 Document Revised: 09/06/2014 Document Reviewed: 02/12/2013 Elsevier Interactive Patient Education [employer].
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
## 2015-04-18 ENCOUNTER — Encounter: Payer: Self-pay | Admitting: Family Medicine

## 2015-04-18 LAB — HEMOGLOBIN A1C
HEMOGLOBIN A1C: 5.7 % — AB (ref <5.7)
Mean Plasma Glucose: 117 mg/dL — ABNORMAL HIGH (ref <117)

## 2015-04-27 ENCOUNTER — Encounter (HOSPITAL_COMMUNITY): Payer: Self-pay | Admitting: *Deleted

## 2015-04-27 ENCOUNTER — Emergency Department (HOSPITAL_COMMUNITY): Payer: Commercial Managed Care - HMO

## 2015-04-27 ENCOUNTER — Emergency Department (HOSPITAL_COMMUNITY)
Admission: EM | Admit: 2015-04-27 | Discharge: 2015-04-27 | Disposition: A | Discharge status: Home / Self Care | Payer: Commercial Managed Care - HMO | Attending: Emergency Medicine | Admitting: Emergency Medicine

## 2015-04-27 DIAGNOSIS — R319 Hematuria, unspecified: Secondary | ICD-10-CM | POA: Insufficient documentation

## 2015-04-27 DIAGNOSIS — K5900 Constipation, unspecified: Secondary | ICD-10-CM | POA: Diagnosis present

## 2015-04-27 DIAGNOSIS — Z7982 Long term (current) use of aspirin: Secondary | ICD-10-CM | POA: Diagnosis not present

## 2015-04-27 DIAGNOSIS — I2510 Atherosclerotic heart disease of native coronary artery without angina pectoris: Secondary | ICD-10-CM | POA: Diagnosis not present

## 2015-04-27 DIAGNOSIS — Z8739 Personal history of other diseases of the musculoskeletal system and connective tissue: Secondary | ICD-10-CM | POA: Insufficient documentation

## 2015-04-27 DIAGNOSIS — I10 Essential (primary) hypertension: Secondary | ICD-10-CM | POA: Diagnosis not present

## 2015-04-27 DIAGNOSIS — R109 Unspecified abdominal pain: Secondary | ICD-10-CM

## 2015-04-27 DIAGNOSIS — G8929 Other chronic pain: Secondary | ICD-10-CM | POA: Diagnosis not present

## 2015-04-27 DIAGNOSIS — K6289 Other specified diseases of anus and rectum: Secondary | ICD-10-CM | POA: Insufficient documentation

## 2015-04-27 DIAGNOSIS — F17210 Nicotine dependence, cigarettes, uncomplicated: Secondary | ICD-10-CM | POA: Diagnosis not present

## 2015-04-27 DIAGNOSIS — K5641 Fecal impaction: Secondary | ICD-10-CM

## 2015-04-27 DIAGNOSIS — J45901 Unspecified asthma with (acute) exacerbation: Secondary | ICD-10-CM | POA: Diagnosis not present

## 2015-04-27 DIAGNOSIS — N400 Enlarged prostate without lower urinary tract symptoms: Secondary | ICD-10-CM | POA: Insufficient documentation

## 2015-04-27 DIAGNOSIS — Z8659 Personal history of other mental and behavioral disorders: Secondary | ICD-10-CM | POA: Insufficient documentation

## 2015-04-27 DIAGNOSIS — Z79899 Other long term (current) drug therapy: Secondary | ICD-10-CM | POA: Insufficient documentation

## 2015-04-27 LAB — I-STAT CHEM 8, ED
BUN: 7 mg/dL (ref 6–20)
CALCIUM ION: 1.18 mmol/L (ref 1.13–1.30)
CHLORIDE: 104 mmol/L (ref 101–111)
CREATININE: 1.1 mg/dL (ref 0.61–1.24)
Glucose, Bld: 105 mg/dL — ABNORMAL HIGH (ref 65–99)
HEMATOCRIT: 43 % (ref 39.0–52.0)
HEMOGLOBIN: 14.6 g/dL (ref 13.0–17.0)
POTASSIUM: 3.9 mmol/L (ref 3.5–5.1)
Sodium: 141 mmol/L (ref 135–145)
TCO2: 27 mmol/L (ref 0–100)

## 2015-04-27 LAB — CBC WITH DIFFERENTIAL/PLATELET
BASOS ABS: 0 10*3/uL (ref 0.0–0.1)
BASOS PCT: 0 %
Eosinophils Absolute: 0 10*3/uL (ref 0.0–0.7)
Eosinophils Relative: 0 %
HEMATOCRIT: 38.7 % — AB (ref 39.0–52.0)
Hemoglobin: 13.3 g/dL (ref 13.0–17.0)
LYMPHS PCT: 22 %
Lymphs Abs: 1.6 10*3/uL (ref 0.7–4.0)
MCH: 30.2 pg (ref 26.0–34.0)
MCHC: 34.4 g/dL (ref 30.0–36.0)
MCV: 88 fL (ref 78.0–100.0)
MONO ABS: 0.3 10*3/uL (ref 0.1–1.0)
Monocytes Relative: 4 %
NEUTROS ABS: 5.1 10*3/uL (ref 1.7–7.7)
Neutrophils Relative %: 74 %
PLATELETS: 192 10*3/uL (ref 150–400)
RBC: 4.4 MIL/uL (ref 4.22–5.81)
RDW: 13.4 % (ref 11.5–15.5)
WBC: 7 10*3/uL (ref 4.0–10.5)

## 2015-04-27 LAB — URINALYSIS, ROUTINE W REFLEX MICROSCOPIC
BILIRUBIN URINE: NEGATIVE
Glucose, UA: NEGATIVE mg/dL
Hgb urine dipstick: NEGATIVE
KETONES UR: NEGATIVE mg/dL
LEUKOCYTES UA: NEGATIVE
NITRITE: NEGATIVE
PH: 6 (ref 5.0–8.0)
PROTEIN: NEGATIVE mg/dL
Specific Gravity, Urine: 1.012 (ref 1.005–1.030)

## 2015-04-27 MED ORDER — POLYETHYLENE GLYCOL 3350 17 G PO PACK: 17.0000 g | PACK | Freq: Every day | ORAL | Status: DC

## 2015-04-27 MED ORDER — MAGNESIUM CITRATE PO SOLN
1.0000 | Freq: Once | ORAL | Status: AC
  Administered 2015-04-27: 1 via ORAL
  Filled 2015-04-27: qty 296

## 2015-04-27 MED ORDER — LIDOCAINE 4 % EX CREA
TOPICAL_CREAM | Freq: Once | CUTANEOUS | Status: AC
  Administered 2015-04-27: 1 via TOPICAL
  Filled 2015-04-27: qty 5

## 2015-04-27 MED ORDER — MILK AND MOLASSES ENEMA
1.0000 | Freq: Once | RECTAL | Status: AC
  Administered 2015-04-27: 250 mL via RECTAL
  Filled 2015-04-27: qty 250
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
## 2015-04-27 NOTE — ED Notes (Signed)
Pt is aware urine is needed, pt unable to urinate at this time. Urinal at bedside.

## 2015-04-27 NOTE — ED Provider Notes (Signed)
CSN: [id number]     Arrival date & time 04/27/15  1233 History   First MD Initiated Contact with Patient 04/27/15 1246     Chief Complaint  Patient presents with  . Constipation     (Consider location/radiation/quality/duration/timing/severity/associated sxs/prior Treatment) HPI Steven Lowery is a 69 y.o. male history of hypertension, coronary disease, irritable bowel syndrome, chronic pain, alcohol abuse, presents to emergency department complaining of constipation. Patient states he has not had a bowel movement in 1 week. He reports rectal pain and blood when he strains and on the tissue. He states he has been trying to disimpact himself at home with no relief. He states he took a stool softener which did not help. He reports worsening rectal pain over the last 3 days. He denies any abdominal pain. No nausea or vomiting. No fever or chills. No urinary symptoms  Past Medical History  Diagnosis Date  . Hypertension   . Acute asthmatic bronchitis   . Cigarette smoker   . Coronary artery disease   . IBS (irritable bowel syndrome)   . Benign prostatic hypertrophy   . DJD (degenerative joint disease)   . Neck pain   . Back pain   . Psychiatric disorder   . Alcohol abuse    Past Surgical History  Procedure Laterality Date  . Cystoscopy  1999    Dr. Janice Norrie   No family history on file. Social History  Substance Use Topics  . Smoking status: Current Every Day Smoker -- 0.30 packs/day for 40 years    Types: Cigarettes  . Smokeless tobacco: Never Used     Comment: has cut back alot per pt  . Alcohol Use: Yes    Review of Systems  Constitutional: Negative for fever and chills.  Respiratory: Negative for cough, chest tightness and shortness of breath.   Cardiovascular: Negative for chest pain, palpitations and leg swelling.  Gastrointestinal: Positive for constipation, blood in stool and rectal pain. Negative for nausea, vomiting, abdominal pain, diarrhea and abdominal distention.   Genitourinary: Negative for dysuria, urgency, frequency and hematuria.  Musculoskeletal: Negative for myalgias, arthralgias, neck pain and neck stiffness.  Skin: Negative for rash.  Neurological: Negative for dizziness, weakness, light-headedness, numbness and headaches.  All other systems reviewed and are negative.     Allergies  Review of patient's allergies indicates no known allergies.  Home Medications   Prior to Admission medications   Medication Sig Start Date End Date Taking? Authorizing Provider  Acetaminophen (TYLENOL ARTHRITIS PAIN PO) Take 1 tablet by mouth every 6 (six) hours as needed (arthritis).   Yes Historical Provider, MD  aspirin EC 81 MG tablet Take 1 tablet (81 mg total) by mouth daily. 11/14/14  Yes Micheline Chapman, NP  Camphor-Eucalyptus-Menthol (ICY HOT NO-MESS VAPOR GEL EX) Apply 1 application topically daily as needed (hip pain).   Yes Historical Provider, MD  docusate sodium (COLACE) 100 MG capsule Take 1 capsule (100 mg total) by mouth daily. 04/17/15  Yes Dorena Dew, FNP  fish oil-omega-3 fatty acids 1000 MG capsule Take 1 g by mouth daily.    Yes Historical Provider, MD  lisinopril (PRINIVIL,ZESTRIL) 20 MG tablet Take 1 tablet (20 mg total) by mouth daily. 11/14/14  Yes Micheline Chapman, NP  Multiple Vitamin (MULTIVITAMIN WITH MINERALS) TABS tablet Take 1 tablet by mouth daily.   Yes Historical Provider, MD  OVER THE COUNTER MEDICATION Place 1 drop into both eyes daily as needed (Eye burning).   Yes Historical Provider,  MD  sodium chloride (OCEAN) 0.65 % SOLN nasal spray Place 1 spray into both nostrils as needed for congestion. 06/03/14  Yes Dahlia Bailiff, PA-C  tamsulosin (FLOMAX) 0.4 MG CAPS capsule Take 1 capsule (0.4 mg total) by mouth daily. 02/08/15  Yes Dorena Dew, FNP  traMADol (ULTRAM) 50 MG tablet Take 1 tablet (50 mg total) by mouth every 8 (eight) hours as needed. 04/17/15  Yes Dorena Dew, FNP  vitamin C (ASCORBIC ACID) 500 MG  tablet Take 500 mg by mouth daily.   Yes Historical Provider, MD  nitroGLYCERIN (NITROSTAT) 0.4 MG SL tablet Place 1 tablet (0.4 mg total) under the tongue every 5 (five) minutes as needed (If you have possible cardiac chest pain not relieved by 3 NTG, call EMS.). For chest pain Patient not taking: Reported on 04/17/2015 11/14/14   Micheline Chapman, NP  sildenafil (VIAGRA) 100 MG tablet Take 1 tablet (100 mg total) by mouth daily as needed for erectile dysfunction. 10/27/12   Noralee Space, MD   BP 153/88 mmHg  Pulse 71  Temp(Src) 98.6 F (37 C) (Oral)  Resp 16  SpO2 100% Physical Exam  Constitutional: He appears well-developed and well-nourished. No distress.  HENT:  Head: Normocephalic and atraumatic.  Eyes: Conjunctivae are normal.  Neck: Neck supple.  Cardiovascular: Normal rate, regular rhythm and normal heart sounds.   Pulmonary/Chest: Effort normal. No respiratory distress. He has no wheezes. He has no rales.  Abdominal: Soft. Bowel sounds are normal. He exhibits no distension. There is no tenderness. There is no rebound.  Genitourinary:  Fissure noted to the rectum. Fecal impaction. Palpable internal hemorrhoid. No active bleeding  Musculoskeletal: He exhibits no edema.  Neurological: He is alert.  Skin: Skin is warm and dry.  Nursing note and vitals reviewed.   ED Course  Procedures (including critical care time) Labs Review Labs Reviewed  CBC WITH DIFFERENTIAL/PLATELET - Abnormal; Notable for the following:    HCT 38.7 (*)    All other components within normal limits  I-STAT CHEM 8, ED - Abnormal; Notable for the following:    Glucose, Bld 105 (*)    All other components within normal limits  URINALYSIS, ROUTINE W REFLEX MICROSCOPIC (NOT AT [facility])    Imaging Review Dg Abd 2 Views  04/27/2015  CLINICAL DATA:  Abdominal pain and constipation. EXAM: ABDOMEN - 2 VIEW COMPARISON:  03/10/2015 FINDINGS: The bowel gas pattern is normal. There is no evidence of free air. No  radio-opaque calculi or other significant radiographic abnormality is seen. IMPRESSION: Negative. Electronically Signed   By: Kerby Moors M.D.   On: 04/27/2015 14:39   I have personally reviewed and evaluated these images and lab results as part of my medical decision-making.   EKG Interpretation None      MDM   Final diagnoses:  Abdominal pain   Pt with fecal impaction, constipation. Anal fissures presents. No abdominal pain. Will check labs, abd. Xray. Will try disimpaction.   attempted manual disimpaction. Small amount of stool out. Pt very tender over the rectum from fissures.  Broke the stool down a bit. Will try enema and mag citrate.   Pt signed out to PA Joy at shift change.      Jeannett Senior, PA-C 04/28/15 [place], MD 05/07/15 [id number]

## 2015-04-27 NOTE — ED Notes (Signed)
Pt and bedding cleaned of small amount of brown liquid stool.

## 2015-04-27 NOTE — ED Notes (Signed)
Pt verbaslizes d/c instruction understanding

## 2015-04-27 NOTE — ED Notes (Signed)
Pt has large response to enema

## 2015-04-27 NOTE — Discharge Instructions (Signed)
Miralax daily to prevent constipation. Follow up with primary care doctor. See information below.    Fecal Impaction A fecal impaction happens when there is a large, firm amount of stool (or feces) that cannot be passed. The impacted stool is usually in the rectum, which is the lowest part of the large bowel. The impacted stool can block the colon and cause significant problems. CAUSES  The longer stool stays in the rectum, the harder it gets. Anything that slows down your bowel movements can lead to fecal impaction, such as:  Constipation. This can be a long-standing (chronic) problem or can happen suddenly (acute).  Painful conditions of the rectum, such as hemorrhoids or anal fissures. The pain of these conditions can make you try to avoid having bowel movements.  Narcotic pain-relieving medicines, such as methadone, morphine, or codeine.  Not drinking enough fluids.  Inactivity and bed rest over long periods of time.  Diseases of the brain or nervous system that damage the nerves controlling the muscles of the intestines. SIGNS AND SYMPTOMS   Lack of normal bowel movements or changes in bowel patterns.  Sense of fullness in the rectum but unable to pass stool.  Pain or cramps in the abdominal area (often after meals).  Thin, watery discharge from the rectum. DIAGNOSIS  Your health care provider may suspect that you have a fecal impaction based on your symptoms and a physical exam. This will include an exam of your rectum. Sometimes X-rays or lab testing may be needed to confirm the diagnosis and to be sure there are no other problems.  TREATMENT   Initially an impaction can be removed manually. Using a gloved finger, your health care provider can remove hard stool from your rectum.  Medicine is sometimes needed. A suppository or enema can be given in the rectum to soften the stool, which can stimulate a bowel movement. Medicines can also be given by mouth (orally).  Though  rare, surgery may be needed if the colon has torn (perforated) due to blockage. HOME CARE INSTRUCTIONS   Develop regular bowel habits. This could include getting in the habit of having a bowel movement after your morning cup of coffee or after eating. Be sure to allow yourself enough time on the toilet.  Maintain a high-fiber diet.  Drink enough fluids to keep your urine clear or pale yellow as directed by your health care provider.  Exercise regularly.  If you begin to get constipated, increase the amount of fiber in your diet. Eat plenty of fruits, vegetables, whole wheat breads, bran, oatmeal, and similar products.  Take natural fiber laxatives or other laxatives only as directed by your health care provider. SEEK MEDICAL CARE IF:   You have ongoing rectal pain.  You require enemas or suppositories more than twice a week.  You have rectal bleeding.  You have continued problems, or you develop abdominal pain.  You have thin, pencil-like stools. SEEK IMMEDIATE MEDICAL CARE IF:  You have black or tarry stools. MAKE SURE YOU:   Understand these instructions.  Will watch your condition.  Will get help right away if you are not doing well or get worse.   This information is not intended to replace advice given to you by your health care provider. Make sure you discuss any questions you have with your health care provider.   Document Released: 01/13/2004 Document Revised: 02/10/2013 Document Reviewed: 10/27/2012 Elsevier Interactive Patient Education 2016 [employer].  Constipation, Adult Constipation is when a person has  fewer than three bowel movements a week, has difficulty having a bowel movement, or has stools that are dry, hard, or larger than normal. As people grow older, constipation is more common. A low-fiber diet, not taking in enough fluids, and taking certain medicines may make constipation worse.  CAUSES   Certain medicines, such as antidepressants, pain  medicine, iron supplements, antacids, and water pills.   Certain diseases, such as diabetes, irritable bowel syndrome (IBS), thyroid disease, or depression.   Not drinking enough water.   Not eating enough fiber-rich foods.   Stress or travel.   Lack of physical activity or exercise.   Ignoring the urge to have a bowel movement.   Using laxatives too much.  SIGNS AND SYMPTOMS   Having fewer than three bowel movements a week.   Straining to have a bowel movement.   Having stools that are hard, dry, or larger than normal.   Feeling full or bloated.   Pain in the lower abdomen.   Not feeling relief after having a bowel movement.  DIAGNOSIS  Your health care provider will take a medical history and perform a physical exam. Further testing may be done for severe constipation. Some tests may include:  A barium enema X-ray to examine your rectum, colon, and, sometimes, your small intestine.   A sigmoidoscopy to examine your lower colon.   A colonoscopy to examine your entire colon. TREATMENT  Treatment will depend on the severity of your constipation and what is causing it. Some dietary treatments include drinking more fluids and eating more fiber-rich foods. Lifestyle treatments may include regular exercise. If these diet and lifestyle recommendations do not help, your health care provider may recommend taking over-the-counter laxative medicines to help you have bowel movements. Prescription medicines may be prescribed if over-the-counter medicines do not work.  HOME CARE INSTRUCTIONS   Eat foods that have a lot of fiber, such as fruits, vegetables, whole grains, and beans.  Limit foods high in fat and processed sugars, such as french fries, hamburgers, cookies, candies, and soda.   A fiber supplement may be added to your diet if you cannot get enough fiber from foods.   Drink enough fluids to keep your urine clear or pale yellow.   Exercise regularly or  as directed by your health care provider.   Go to the restroom when you have the urge to go. Do not hold it.   Only take over-the-counter or prescription medicines as directed by your health care provider. Do not take other medicines for constipation without talking to your health care provider first.  [place] IF:   You have bright red blood in your stool.   Your constipation lasts for more than 4 days or gets worse.   You have abdominal or rectal pain.   You have thin, pencil-like stools.   You have unexplained weight loss. MAKE SURE YOU:   Understand these instructions.  Will watch your condition.  Will get help right away if you are not doing well or get worse.   This information is not intended to replace advice given to you by your health care provider. Make sure you discuss any questions you have with your health care provider.   Document Released: 01/19/2004 Document Revised: 05/13/2014 Document Reviewed: 02/01/2013 Elsevier Interactive Patient Education [employer].

## 2015-04-27 NOTE — ED Provider Notes (Signed)
4:18 PM received patient care handoff report received from [facility], PA-C Pt is here for constipation and fecal impaction. Fecal disimpaction was attempted without success. No abdominal pain. Pt to receive enema and MiraLAX. Plan: discharge after relief of constipation. 6:32 PM patient had a bowel movement and states that he feels much better. Patient is appropriate for discharge at this time.  Filed Vitals:   04/27/15 1245 04/27/15 1315 04/27/15 1345 04/27/15 1415  BP: 153/88 152/92 159/90 141/90  Pulse: 93 75 81 72  Temp: 98.6 F (37 C)     TempSrc: Oral     Resp: 16     SpO2: 100% 100% 100% 100%     Lorayne Bender, PA-C 04/27/15 1833  Sherwood Gambler, MD 04/28/15 2317

## 2015-05-18 ENCOUNTER — Ambulatory Visit (INDEPENDENT_AMBULATORY_CARE_PROVIDER_SITE_OTHER): Payer: Commercial Managed Care - HMO | Admitting: Family Medicine

## 2015-05-18 ENCOUNTER — Encounter: Payer: Self-pay | Admitting: Family Medicine

## 2015-05-18 VITALS — BP 152/98 | HR 79 | Temp 98.2°F | Resp 14 | Ht 74.0 in | Wt 166.0 lb

## 2015-05-18 DIAGNOSIS — F17200 Nicotine dependence, unspecified, uncomplicated: Secondary | ICD-10-CM | POA: Diagnosis not present

## 2015-05-18 DIAGNOSIS — I10 Essential (primary) hypertension: Secondary | ICD-10-CM

## 2015-05-18 DIAGNOSIS — M542 Cervicalgia: Secondary | ICD-10-CM | POA: Diagnosis not present

## 2015-05-18 DIAGNOSIS — G8929 Other chronic pain: Secondary | ICD-10-CM | POA: Diagnosis not present

## 2015-05-18 LAB — POCT URINALYSIS DIP (DEVICE)
BILIRUBIN URINE: NEGATIVE
Glucose, UA: NEGATIVE mg/dL
Ketones, ur: NEGATIVE mg/dL
LEUKOCYTES UA: NEGATIVE
NITRITE: NEGATIVE
PH: 5.5 (ref 5.0–8.0)
Protein, ur: NEGATIVE mg/dL
Specific Gravity, Urine: 1.02 (ref 1.005–1.030)
Urobilinogen, UA: 0.2 mg/dL (ref 0.0–1.0)

## 2015-05-18 LAB — BASIC METABOLIC PANEL
BUN: 8 mg/dL (ref 7–25)
CALCIUM: 9.5 mg/dL (ref 8.6–10.3)
CO2: 25 mmol/L (ref 20–31)
CREATININE: 0.91 mg/dL (ref 0.70–1.25)
Chloride: 102 mmol/L (ref 98–110)
Glucose, Bld: 90 mg/dL (ref 65–99)
Potassium: 4.3 mmol/L (ref 3.5–5.3)
Sodium: 139 mmol/L (ref 135–146)

## 2015-05-18 MED ORDER — AMLODIPINE BESYLATE 5 MG PO TABS: 5.0000 mg | ORAL_TABLET | Freq: Every day | ORAL | Status: DC

## 2015-05-18 MED ORDER — KETOROLAC TROMETHAMINE 60 MG/2ML IM SOLN
30.0000 mg | Freq: Once | INTRAMUSCULAR | Status: AC
  Administered 2015-05-18: 30 mg via INTRAMUSCULAR

## 2015-05-18 MED ORDER — TRAMADOL HCL 50 MG PO TABS: 50.0000 mg | ORAL_TABLET | Freq: Three times a day (TID) | ORAL | Status: DC | PRN

## 2015-05-18 NOTE — Patient Instructions (Signed)
Will start Amlodipine 5 mg for uncontrolled blood pressure. Will follow up in 1 month for hypertension. The patient was given clear instructions to go to ER or return to medical center if symptoms do not improve, worsen or new problems develop. The patient verbalized understanding. Will notify patient with laboratory results.   DASH Eating Plan DASH stands for "Dietary Approaches to Stop Hypertension." The DASH eating plan is a healthy eating plan that has been shown to reduce high blood pressure (hypertension). Additional health benefits may include reducing the risk of type 2 diabetes mellitus, heart disease, and stroke. The DASH eating plan may also help with weight loss. WHAT DO I NEED TO KNOW ABOUT THE DASH EATING PLAN? For the DASH eating plan, you will follow these general guidelines:  Choose foods with a percent daily value for sodium of less than 5% (as listed on the food label).  Use salt-free seasonings or herbs instead of table salt or sea salt.  Check with your health care provider or pharmacist before using salt substitutes.  Eat lower-sodium products, often labeled as "lower sodium" or "no salt added."  Eat fresh foods.  Eat more vegetables, fruits, and low-fat dairy products.  Choose whole grains. Look for the word "whole" as the first word in the ingredient list.  Choose fish and skinless chicken or [place] more often than red meat. Limit fish, poultry, and meat to 6 oz (170 g) each day.  Limit sweets, desserts, sugars, and sugary drinks.  Choose heart-healthy fats.  Limit cheese to 1 oz (28 g) per day.  Eat more home-cooked food and less restaurant, buffet, and fast food.  Limit fried foods.  Cook foods using methods other than frying.  Limit canned vegetables. If you do use them, rinse them well to decrease the sodium.  When eating at a restaurant, ask that your food be prepared with less salt, or no salt if possible. WHAT FOODS CAN I EAT? Seek help from a  dietitian for individual calorie needs. Grains Whole grain or whole wheat bread. Brown rice. Whole grain or whole wheat pasta. Quinoa, bulgur, and whole grain cereals. Low-sodium cereals. Corn or whole wheat flour tortillas. Whole grain cornbread. Whole grain crackers. Low-sodium crackers. Vegetables Fresh or frozen vegetables (raw, steamed, roasted, or grilled). Low-sodium or reduced-sodium tomato and vegetable juices. Low-sodium or reduced-sodium tomato sauce and paste. Low-sodium or reduced-sodium canned vegetables.  Fruits All fresh, canned (in natural juice), or frozen fruits. Meat and Other Protein Products Ground beef (85% or leaner), grass-fed beef, or beef trimmed of fat. Skinless chicken or [place]. Ground chicken or [place]. Pork trimmed of fat. All fish and seafood. Eggs. Dried beans, peas, or lentils. Unsalted nuts and seeds. Unsalted canned beans. Dairy Low-fat dairy products, such as skim or 1% milk, 2% or reduced-fat cheeses, low-fat ricotta or cottage cheese, or plain low-fat yogurt. Low-sodium or reduced-sodium cheeses. Fats and Oils Tub margarines without trans fats. Light or reduced-fat mayonnaise and salad dressings (reduced sodium). Avocado. Safflower, olive, or canola oils. Natural peanut or almond butter. Other Unsalted popcorn and pretzels. The items listed above may not be a complete list of recommended foods or beverages. Contact your dietitian for more options. WHAT FOODS ARE NOT RECOMMENDED? Grains White bread. White pasta. White rice. Refined cornbread. Bagels and croissants. Crackers that contain trans fat. Vegetables Creamed or fried vegetables. Vegetables in a cheese sauce. Regular canned vegetables. Regular canned tomato sauce and paste. Regular tomato and vegetable juices. Fruits Dried fruits. Canned fruit in  light or heavy syrup. Fruit juice. Meat and Other Protein Products Fatty cuts of meat. Ribs, chicken wings, bacon, sausage, bologna, salami,  chitterlings, fatback, hot dogs, bratwurst, and packaged luncheon meats. Salted nuts and seeds. Canned beans with salt. Dairy Whole or 2% milk, cream, half-and-half, and cream cheese. Whole-fat or sweetened yogurt. Full-fat cheeses or blue cheese. Nondairy creamers and whipped toppings. Processed cheese, cheese spreads, or cheese curds. Condiments Onion and garlic salt, seasoned salt, table salt, and sea salt. Canned and packaged gravies. Worcestershire sauce. Tartar sauce. Barbecue sauce. Teriyaki sauce. Soy sauce, including reduced sodium. Steak sauce. Fish sauce. Oyster sauce. Cocktail sauce. Horseradish. Ketchup and mustard. Meat flavorings and tenderizers. Bouillon cubes. Hot sauce. Tabasco sauce. Marinades. Taco seasonings. Relishes. Fats and Oils Butter, stick margarine, lard, shortening, ghee, and bacon fat. Coconut, palm kernel, or palm oils. Regular salad dressings. Other Pickles and olives. Salted popcorn and pretzels. The items listed above may not be a complete list of foods and beverages to avoid. Contact your dietitian for more information. WHERE CAN I FIND MORE INFORMATION? National Heart, Lung, and Blood Institute: travelstabloid.com   This information is not intended to replace advice given to you by your health care provider. Make sure you discuss any questions you have with your health care provider.   Document Released: 04/11/2011 Document Revised: 05/13/2014 Document Reviewed: 02/24/2013 Elsevier Interactive Patient Education 2016 [employer]. Hypertension Hypertension, commonly called high blood pressure, is when the force of blood pumping through your arteries is too strong. Your arteries are the blood vessels that carry blood from your heart throughout your body. A blood pressure reading consists of a higher number over a lower number, such as 110/72. The higher number (systolic) is the pressure inside your arteries when your heart pumps.  The lower number (diastolic) is the pressure inside your arteries when your heart relaxes. Ideally you want your blood pressure below 120/80. Hypertension forces your heart to work harder to pump blood. Your arteries may become narrow or stiff. Having untreated or uncontrolled hypertension can cause heart attack, stroke, kidney disease, and other problems. RISK FACTORS Some risk factors for high blood pressure are controllable. Others are not.  Risk factors you cannot control include:   Race. You may be at higher risk if you are African American.  Age. Risk increases with age.  Gender. Men are at higher risk than women before age 27 years. After age 40, women are at higher risk than men. Risk factors you can control include:  Not getting enough exercise or physical activity.  Being overweight.  Getting too much fat, sugar, calories, or salt in your diet.  Drinking too much alcohol. SIGNS AND SYMPTOMS Hypertension does not usually cause signs or symptoms. Extremely high blood pressure (hypertensive crisis) may cause headache, anxiety, shortness of breath, and nosebleed. DIAGNOSIS To check if you have hypertension, your health care provider will measure your blood pressure while you are seated, with your arm held at the level of your heart. It should be measured at least twice using the same arm. Certain conditions can cause a difference in blood pressure between your right and left arms. A blood pressure reading that is higher than normal on one occasion does not mean that you need treatment. If it is not clear whether you have high blood pressure, you may be asked to return on a different day to have your blood pressure checked again. Or, you may be asked to monitor your blood pressure at home for 1  or more weeks. TREATMENT Treating high blood pressure includes making lifestyle changes and possibly taking medicine. Living a healthy lifestyle can help lower high blood pressure. You may need  to change some of your habits. Lifestyle changes may include:  Following the DASH diet. This diet is high in fruits, vegetables, and whole grains. It is low in salt, red meat, and added sugars.  Keep your sodium intake below 2,300 mg per day.  Getting at least 30-45 minutes of aerobic exercise at least 4 times per week.  Losing weight if necessary.  Not smoking.  Limiting alcoholic beverages.  Learning ways to reduce stress. Your health care provider may prescribe medicine if lifestyle changes are not enough to get your blood pressure under control, and if one of the following is true:  You are 44-66 years of age and your systolic blood pressure is above 140.  You are 64 years of age or older, and your systolic blood pressure is above 150.  Your diastolic blood pressure is above 90.  You have diabetes, and your systolic blood pressure is over [id number] or your diastolic blood pressure is over 90.  You have kidney disease and your blood pressure is above 140/90.  You have heart disease and your blood pressure is above 140/90. Your personal target blood pressure may vary depending on your medical conditions, your age, and other factors. HOME CARE INSTRUCTIONS  Have your blood pressure rechecked as directed by your health care provider.   Take medicines only as directed by your health care provider. Follow the directions carefully. Blood pressure medicines must be taken as prescribed. The medicine does not work as well when you skip doses. Skipping doses also puts you at risk for problems.  Do not smoke.   Monitor your blood pressure at home as directed by your health care provider. SEEK MEDICAL CARE IF:   You think you are having a reaction to medicines taken.  You have recurrent headaches or feel dizzy.  You have swelling in your ankles.  You have trouble with your vision. SEEK IMMEDIATE MEDICAL CARE IF:  You develop a severe headache or confusion.  You have unusual  weakness, numbness, or feel faint.  You have severe chest or abdominal pain.  You vomit repeatedly.  You have trouble breathing. MAKE SURE YOU:   Understand these instructions.  Will watch your condition.  Will get help right away if you are not doing well or get worse.   This information is not intended to replace advice given to you by your health care provider. Make sure you discuss any questions you have with your health care provider.   Document Released: 04/22/2005 Document Revised: 09/06/2014 Document Reviewed: 02/12/2013 Elsevier Interactive Patient Education [employer].
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
## 2015-05-24 ENCOUNTER — Emergency Department (HOSPITAL_COMMUNITY)
Admission: EM | Admit: 2015-05-24 | Discharge: 2015-05-24 | Discharge status: Against Medical Advice | Payer: Commercial Managed Care - HMO | Attending: Emergency Medicine | Admitting: Emergency Medicine

## 2015-05-24 ENCOUNTER — Encounter (HOSPITAL_COMMUNITY): Payer: Self-pay | Admitting: *Deleted

## 2015-05-24 DIAGNOSIS — I2510 Atherosclerotic heart disease of native coronary artery without angina pectoris: Secondary | ICD-10-CM | POA: Insufficient documentation

## 2015-05-24 DIAGNOSIS — F17210 Nicotine dependence, cigarettes, uncomplicated: Secondary | ICD-10-CM | POA: Insufficient documentation

## 2015-05-24 DIAGNOSIS — K6289 Other specified diseases of anus and rectum: Secondary | ICD-10-CM | POA: Insufficient documentation

## 2015-05-24 DIAGNOSIS — I10 Essential (primary) hypertension: Secondary | ICD-10-CM | POA: Insufficient documentation

## 2015-05-24 NOTE — ED Notes (Signed)
Upon entering room, pt states he doesn't want to be seen by MD and wishes to leave. Pt signed AMA form.

## 2015-05-24 NOTE — ED Notes (Signed)
Pt reports having constipation two weeks ago and straining, now has rectal pain and some mild bleeding. Reports constipation has been relieved but only has the rectal pain now. No acute distress noted at triage.

## 2015-06-20 ENCOUNTER — Ambulatory Visit (INDEPENDENT_AMBULATORY_CARE_PROVIDER_SITE_OTHER): Payer: Commercial Managed Care - HMO | Admitting: Family Medicine

## 2015-06-20 ENCOUNTER — Encounter: Payer: Self-pay | Admitting: Family Medicine

## 2015-06-20 VITALS — BP 156/96 | HR 74 | Temp 98.0°F | Resp 16 | Ht 74.0 in | Wt 165.0 lb

## 2015-06-20 DIAGNOSIS — M542 Cervicalgia: Secondary | ICD-10-CM

## 2015-06-20 DIAGNOSIS — I10 Essential (primary) hypertension: Secondary | ICD-10-CM | POA: Diagnosis not present

## 2015-06-20 DIAGNOSIS — F17200 Nicotine dependence, unspecified, uncomplicated: Secondary | ICD-10-CM

## 2015-06-20 MED ORDER — TRAMADOL HCL 50 MG PO TABS: 50.0000 mg | ORAL_TABLET | Freq: Three times a day (TID) | ORAL | Status: DC | PRN

## 2015-06-20 MED ORDER — AMLODIPINE BESYLATE 5 MG PO TABS: 5.0000 mg | ORAL_TABLET | Freq: Every day | ORAL | Status: DC
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
## 2015-06-20 NOTE — Patient Instructions (Addendum)
Will send a referral for colonoscopy Will send a referral to sports medicine for further evaluation Chronic Back Pain  When back pain lasts longer than 3 months, it is called chronic back pain.People with chronic back pain often go through certain periods that are more intense (flare-ups).  CAUSES Chronic back pain can be caused by wear and tear (degeneration) on different structures in your back. These structures include:  The bones of your spine (vertebrae) and the joints surrounding your spinal cord and nerve roots (facets).  The strong, fibrous tissues that connect your vertebrae (ligaments). Degeneration of these structures may result in pressure on your nerves. This can lead to constant pain. HOME CARE INSTRUCTIONS  Avoid bending, heavy lifting, prolonged sitting, and activities which make the problem worse.  Take brief periods of rest throughout the day to reduce your pain. Lying down or standing usually is better than sitting while you are resting.  Take over-the-counter or prescription medicines only as directed by your caregiver. SEEK IMMEDIATE MEDICAL CARE IF:   You have weakness or numbness in one of your legs or feet.  You have trouble controlling your bladder or bowels.  You have nausea, vomiting, abdominal pain, shortness of breath, or fainting.   This information is not intended to replace advice given to you by your health care provider. Make sure you discuss any questions you have with your health care provider.   Document Released: 05/30/2004 Document Revised: 07/15/2011 Document Reviewed: 10/10/2014 Elsevier Interactive Patient Education [employer].

## 2015-08-03 ENCOUNTER — Telehealth: Payer: Self-pay

## 2015-08-03 DIAGNOSIS — I10 Essential (primary) hypertension: Secondary | ICD-10-CM

## 2015-08-03 DIAGNOSIS — N400 Enlarged prostate without lower urinary tract symptoms: Secondary | ICD-10-CM

## 2015-08-03 MED ORDER — AMLODIPINE BESYLATE 5 MG PO TABS: 5.0000 mg | ORAL_TABLET | Freq: Every day | ORAL | Status: DC

## 2015-08-03 MED ORDER — ASPIRIN EC 81 MG PO TBEC: 81.0000 mg | DELAYED_RELEASE_TABLET | Freq: Every day | ORAL | Status: AC

## 2015-08-03 MED ORDER — TAMSULOSIN HCL 0.4 MG PO CAPS: 0.4000 mg | ORAL_CAPSULE | Freq: Every day | ORAL | Status: DC

## 2015-08-03 MED ORDER — LISINOPRIL 20 MG PO TABS: 20.0000 mg | ORAL_TABLET | Freq: Every day | ORAL | Status: DC

## 2015-08-03 NOTE — Telephone Encounter (Signed)
Pt called requesting refills on all of his medications. He wants them sent to his mail order pharmacy.

## 2015-08-03 NOTE — Telephone Encounter (Signed)
Medications sent into pharmacy. Thanks.

## 2015-08-04 ENCOUNTER — Encounter (HOSPITAL_COMMUNITY): Payer: Self-pay | Admitting: Emergency Medicine

## 2015-08-04 ENCOUNTER — Emergency Department (HOSPITAL_COMMUNITY)
Admission: EM | Admit: 2015-08-04 | Discharge: 2015-08-04 | Disposition: A | Discharge status: Home / Self Care | Payer: Commercial Managed Care - HMO | Attending: Emergency Medicine | Admitting: Emergency Medicine

## 2015-08-04 DIAGNOSIS — X500XXA Overexertion from strenuous movement or load, initial encounter: Secondary | ICD-10-CM | POA: Insufficient documentation

## 2015-08-04 DIAGNOSIS — I10 Essential (primary) hypertension: Secondary | ICD-10-CM | POA: Insufficient documentation

## 2015-08-04 DIAGNOSIS — F17210 Nicotine dependence, cigarettes, uncomplicated: Secondary | ICD-10-CM | POA: Insufficient documentation

## 2015-08-04 DIAGNOSIS — Y998 Other external cause status: Secondary | ICD-10-CM | POA: Insufficient documentation

## 2015-08-04 DIAGNOSIS — Y9289 Other specified places as the place of occurrence of the external cause: Secondary | ICD-10-CM | POA: Insufficient documentation

## 2015-08-04 DIAGNOSIS — Z79899 Other long term (current) drug therapy: Secondary | ICD-10-CM | POA: Insufficient documentation

## 2015-08-04 DIAGNOSIS — N400 Enlarged prostate without lower urinary tract symptoms: Secondary | ICD-10-CM | POA: Insufficient documentation

## 2015-08-04 DIAGNOSIS — I2510 Atherosclerotic heart disease of native coronary artery without angina pectoris: Secondary | ICD-10-CM | POA: Insufficient documentation

## 2015-08-04 DIAGNOSIS — M1990 Unspecified osteoarthritis, unspecified site: Secondary | ICD-10-CM | POA: Diagnosis not present

## 2015-08-04 DIAGNOSIS — Z8719 Personal history of other diseases of the digestive system: Secondary | ICD-10-CM | POA: Diagnosis not present

## 2015-08-04 DIAGNOSIS — Y9389 Activity, other specified: Secondary | ICD-10-CM | POA: Insufficient documentation

## 2015-08-04 DIAGNOSIS — G8929 Other chronic pain: Secondary | ICD-10-CM | POA: Insufficient documentation

## 2015-08-04 DIAGNOSIS — S39012A Strain of muscle, fascia and tendon of lower back, initial encounter: Secondary | ICD-10-CM | POA: Insufficient documentation

## 2015-08-04 DIAGNOSIS — Z7982 Long term (current) use of aspirin: Secondary | ICD-10-CM | POA: Insufficient documentation

## 2015-08-04 DIAGNOSIS — J45909 Unspecified asthma, uncomplicated: Secondary | ICD-10-CM | POA: Insufficient documentation

## 2015-08-04 DIAGNOSIS — M542 Cervicalgia: Secondary | ICD-10-CM

## 2015-08-04 DIAGNOSIS — S3992XA Unspecified injury of lower back, initial encounter: Secondary | ICD-10-CM | POA: Diagnosis present

## 2015-08-04 MED ORDER — TRAMADOL HCL 50 MG PO TABS: 50.0000 mg | ORAL_TABLET | Freq: Three times a day (TID) | ORAL | Status: DC | PRN

## 2015-08-04 MED ORDER — CYCLOBENZAPRINE HCL 10 MG PO TABS: 10.0000 mg | ORAL_TABLET | Freq: Two times a day (BID) | ORAL | Status: DC | PRN

## 2015-08-04 NOTE — ED Provider Notes (Signed)
CSN: [id number]     Arrival date & time 08/04/15  1113 History  By signing my name below, I, Steven Lowery, attest that this documentation has been prepared under the direction and in the presence of Montine Circle, PA-C Electronically Signed: Ladene Artist, ED Scribe 08/04/2015 at 11:33 AM.   No chief complaint on file.  The history is provided by the patient.   HPI Comments: Steven Lowery is a 70 y.o. male, with a h/o chronic back pain, who presents to the Emergency Department with a chief complaint of acute on chronic back pain onset a few days ago. Pt states that he was lifting a washing machine when he noticed sudden onset of back pain that radiates into both lower extremities. Pt states that he typically treats chronic back pain with Tramadol but is currently out. He denies bladder/bowel incontinence, numbness in lower extremities. No h/o DM.   Past Medical History  Diagnosis Date  . Hypertension   . Acute asthmatic bronchitis   . Cigarette smoker   . Coronary artery disease   . IBS (irritable bowel syndrome)   . Benign prostatic hypertrophy   . DJD (degenerative joint disease)   . Neck pain   . Back pain   . Psychiatric disorder   . Alcohol abuse    Past Surgical History  Procedure Laterality Date  . Cystoscopy  1999    Dr. Janice Norrie   No family history on file. Social History  Substance Use Topics  . Smoking status: Current Every Day Smoker -- 0.30 packs/day for 40 years    Types: Cigarettes  . Smokeless tobacco: Never Used     Comment: has cut back alot per pt  . Alcohol Use: Yes    Review of Systems  Constitutional: Negative for fever and chills.  Gastrointestinal:       No bowel incontinence  Genitourinary:       No urinary incontinence  Musculoskeletal: Positive for myalgias, back pain and arthralgias.  Neurological: Negative for numbness.       No saddle anesthesia   Allergies  Review of patient's allergies indicates no known allergies.  Home  Medications   Prior to Admission medications   Medication Sig Start Date End Date Taking? Authorizing Provider  Acetaminophen (TYLENOL ARTHRITIS PAIN PO) Take 1 tablet by mouth every 6 (six) hours as needed (arthritis).    Historical Provider, MD  amLODipine (NORVASC) 5 MG tablet Take 1 tablet (5 mg total) by mouth daily. 08/03/15   Dorena Dew, FNP  aspirin EC 81 MG tablet Take 1 tablet (81 mg total) by mouth daily. 08/03/15   Dorena Dew, FNP  Camphor-Eucalyptus-Menthol (ICY HOT NO-MESS VAPOR GEL EX) Apply 1 application topically daily as needed (hip pain).    Historical Provider, MD  docusate sodium (COLACE) 100 MG capsule Take 1 capsule (100 mg total) by mouth daily. 04/17/15   Dorena Dew, FNP  fish oil-omega-3 fatty acids 1000 MG capsule Take 1 g by mouth daily.     Historical Provider, MD  lisinopril (PRINIVIL,ZESTRIL) 20 MG tablet Take 1 tablet (20 mg total) by mouth daily. 08/03/15   Dorena Dew, FNP  Multiple Vitamin (MULTIVITAMIN WITH MINERALS) TABS tablet Take 1 tablet by mouth daily.    Historical Provider, MD  nitroGLYCERIN (NITROSTAT) 0.4 MG SL tablet Place 1 tablet (0.4 mg total) under the tongue every 5 (five) minutes as needed (If you have possible cardiac chest pain not relieved by 3 NTG, call EMS.).  For chest pain Patient not taking: Reported on 04/17/2015 11/14/14   Micheline Chapman, NP  OVER THE COUNTER MEDICATION Place 1 drop into both eyes daily as needed (Eye burning).    Historical Provider, MD  polyethylene glycol (MIRALAX / GLYCOLAX) packet Take 17 g by mouth daily. 04/27/15   Tatyana Kirichenko, PA-C  sodium chloride (OCEAN) 0.65 % SOLN nasal spray Place 1 spray into both nostrils as needed for congestion. 06/03/14   Dahlia Bailiff, PA-C  tamsulosin (FLOMAX) 0.4 MG CAPS capsule Take 1 capsule (0.4 mg total) by mouth daily. 08/03/15   Dorena Dew, FNP  traMADol (ULTRAM) 50 MG tablet Take 1 tablet (50 mg total) by mouth every 8 (eight) hours as needed.  06/20/15   Dorena Dew, FNP  vitamin C (ASCORBIC ACID) 500 MG tablet Take 500 mg by mouth daily.    Historical Provider, MD   BP 127/90 mmHg  Pulse 80  Temp(Src) 98.2 F (36.8 C) (Oral)  Resp 14  SpO2 100% Physical Exam  Constitutional: He is oriented to person, place, and time. He appears well-developed and well-nourished. No distress.  HENT:  Head: Normocephalic and atraumatic.  Eyes: Conjunctivae and EOM are normal. Right eye exhibits no discharge. Left eye exhibits no discharge. No scleral icterus.  Neck: Normal range of motion. Neck supple. No tracheal deviation present.  Cardiovascular: Normal rate, regular rhythm and normal heart sounds.  Exam reveals no gallop and no friction rub.   No murmur heard. Pulmonary/Chest: Effort normal and breath sounds normal. No respiratory distress. He has no wheezes.  Abdominal: Soft. He exhibits no distension. There is no tenderness.  Musculoskeletal: Normal range of motion.  Lumbar paraspinal muscles tender to palpation, no bony tenderness, step-offs, or gross abnormality or deformity of spine, patient is able to ambulate, moves all extremities  Bilateral great toe extension intact Bilateral plantar/dorsiflexion intact  Neurological: He is alert and oriented to person, place, and time.  Sensation and strength intact bilaterally   Skin: Skin is warm. He is not diaphoretic.  Psychiatric: He has a normal mood and affect. His behavior is normal. Judgment and thought content normal.  Nursing note and vitals reviewed.   ED Course  Procedures (including critical care time) DIAGNOSTIC STUDIES: Oxygen Saturation is 100% on RA, normal by my interpretation.    COORDINATION OF CARE: 11:29 AM-Discussed treatment plan which includes Tramadol with pt at bedside and pt agreed to plan.    MDM   Final diagnoses:  Lumbar strain, initial encounter    Patient with back pain.  No neurological deficits and normal neuro exam.  Patient is  ambulatory.  No loss of bowel or bladder control.  Doubt cauda equina.  Denies fever,  doubt epidural abscess or other lesion. Recommend back exercises, stretching, RICE, and will treat with a short course of tramadol.  Encouraged the patient that there could be a need for additional workup and/or imaging such as MRI, if the symptoms do not resolve. Patient advised that if the back pain does not resolve, or radiates, this could progress to more serious conditions and is encouraged to follow-up with PCP or orthopedics within 2 weeks.     I personally performed the services described in this documentation, which was scribed in my presence. The recorded information has been reviewed and is accurate.     Montine Circle, PA-C 08/04/15 [place], MD 08/04/15 [phone]

## 2015-08-04 NOTE — Discharge Instructions (Signed)
Sciatica With Rehab The sciatic nerve runs from the back down the leg and is responsible for sensation and control of the muscles in the back (posterior) side of the thigh, lower leg, and foot. Sciatica is a condition that is characterized by inflammation of this nerve.  SYMPTOMS   Signs of nerve damage, including numbness and/or weakness along the posterior side of the lower extremity.  Pain in the back of the thigh that may also travel down the leg.  Pain that worsens when sitting for long periods of time.  Occasionally, pain in the back or buttock. CAUSES  Inflammation of the sciatic nerve is the cause of sciatica. The inflammation is due to something irritating the nerve. Common sources of irritation include:  Sitting for long periods of time.  Direct trauma to the nerve.  Arthritis of the spine.  Herniated or ruptured disk.  Slipping of the vertebrae (spondylolisthesis).  Pressure from soft tissues, such as muscles or ligament-like tissue (fascia). RISK INCREASES WITH:  Sports that place pressure or stress on the spine (football or weightlifting).  Poor strength and flexibility.  Failure to warm up properly before activity.  Family history of low back pain or disk disorders.  Previous back injury or surgery.  Poor body mechanics, especially when lifting, or poor posture. PREVENTION   Warm up and stretch properly before activity.  Maintain physical fitness:  Strength, flexibility, and endurance.  Cardiovascular fitness.  Learn and use proper technique, especially with posture and lifting. When possible, have coach correct improper technique.  Avoid activities that place stress on the spine. PROGNOSIS If treated properly, then sciatica usually resolves within 6 weeks. However, occasionally surgery is necessary.  RELATED COMPLICATIONS   Permanent nerve damage, including pain, numbness, tingle, or weakness.  Chronic back pain.  Risks of surgery: infection,  bleeding, nerve damage, or damage to surrounding tissues. TREATMENT Treatment initially involves resting from any activities that aggravate your symptoms. The use of ice and medication may help reduce pain and inflammation. The use of strengthening and stretching exercises may help reduce pain with activity. These exercises may be performed at home or with referral to a therapist. A therapist may recommend further treatments, such as transcutaneous electronic nerve stimulation (TENS) or ultrasound. Your caregiver may recommend corticosteroid injections to help reduce inflammation of the sciatic nerve. If symptoms persist despite non-surgical (conservative) treatment, then surgery may be recommended. MEDICATION  If pain medication is necessary, then nonsteroidal anti-inflammatory medications, such as aspirin and ibuprofen, or other minor pain relievers, such as acetaminophen, are often recommended.  Do not take pain medication for 7 days before surgery.  Prescription pain relievers may be given if deemed necessary by your caregiver. Use only as directed and only as much as you need.  Ointments applied to the skin may be helpful.  Corticosteroid injections may be given by your caregiver. These injections should be reserved for the most serious cases, because they may only be given a certain number of times. HEAT AND COLD  Cold treatment (icing) relieves pain and reduces inflammation. Cold treatment should be applied for 10 to 15 minutes every 2 to 3 hours for inflammation and pain and immediately after any activity that aggravates your symptoms. Use ice packs or massage the area with a piece of ice (ice massage).  Heat treatment may be used prior to performing the stretching and strengthening activities prescribed by your caregiver, physical therapist, or athletic trainer. Use a heat pack or soak the injury in warm water.   SEEK MEDICAL CARE IF:  Treatment seems to offer no benefit, or the condition  worsens.  Any medications produce adverse side effects. EXERCISES  RANGE OF MOTION (ROM) AND STRETCHING EXERCISES - Sciatica Most people with sciatic will find that their symptoms worsen with either excessive bending forward (flexion) or arching at the low back (extension). The exercises which will help resolve your symptoms will focus on the opposite motion. Your physician, physical therapist or athletic trainer will help you determine which exercises will be most helpful to resolve your low back pain. Do not complete any exercises without first consulting with your clinician. Discontinue any exercises which worsen your symptoms until you speak to your clinician. If you have pain, numbness or tingling which travels down into your buttocks, leg or foot, the goal of the therapy is for these symptoms to move closer to your back and eventually resolve. Occasionally, these leg symptoms will get better, but your low back pain may worsen; this is typically an indication of progress in your rehabilitation. Be certain to be very alert to any changes in your symptoms and the activities in which you participated in the 24 hours prior to the change. Sharing this information with your clinician will allow him/her to most efficiently treat your condition. These exercises may help you when beginning to rehabilitate your injury. Your symptoms may resolve with or without further involvement from your physician, physical therapist or athletic trainer. While completing these exercises, remember:   Restoring tissue flexibility helps normal motion to return to the joints. This allows healthier, less painful movement and activity.  An effective stretch should be held for at least 30 seconds.  A stretch should never be painful. You should only feel a gentle lengthening or release in the stretched tissue. FLEXION RANGE OF MOTION AND STRETCHING EXERCISES: STRETCH - Flexion, Single Knee to Chest   Lie on a firm bed or floor  with both legs extended in front of you.  Keeping one leg in contact with the floor, bring your opposite knee to your chest. Hold your leg in place by either grabbing behind your thigh or at your knee.  Pull until you feel a gentle stretch in your low back. Hold __________ seconds.  Slowly release your grasp and repeat the exercise with the opposite side. Repeat __________ times. Complete this exercise __________ times per day.  STRETCH - Flexion, Double Knee to Chest  Lie on a firm bed or floor with both legs extended in front of you.  Keeping one leg in contact with the floor, bring your opposite knee to your chest.  Tense your stomach muscles to support your back and then lift your other knee to your chest. Hold your legs in place by either grabbing behind your thighs or at your knees.  Pull both knees toward your chest until you feel a gentle stretch in your low back. Hold __________ seconds.  Tense your stomach muscles and slowly return one leg at a time to the floor. Repeat __________ times. Complete this exercise __________ times per day.  STRETCH - Low Trunk Rotation   Lie on a firm bed or floor. Keeping your legs in front of you, bend your knees so they are both pointed toward the ceiling and your feet are flat on the floor.  Extend your arms out to the side. This will stabilize your upper body by keeping your shoulders in contact with the floor.  Gently and slowly drop both knees together to one side until   you feel a gentle stretch in your low back. Hold for __________ seconds.  Tense your stomach muscles to support your low back as you bring your knees back to the starting position. Repeat the exercise to the other side. Repeat __________ times. Complete this exercise __________ times per day  EXTENSION RANGE OF MOTION AND FLEXIBILITY EXERCISES: STRETCH - Extension, Prone on Elbows  Lie on your stomach on the floor, a bed will be too soft. Place your palms about shoulder  width apart and at the height of your head.  Place your elbows under your shoulders. If this is too painful, stack pillows under your chest.  Allow your body to relax so that your hips drop lower and make contact more completely with the floor.  Hold this position for __________ seconds.  Slowly return to lying flat on the floor. Repeat __________ times. Complete this exercise __________ times per day.  RANGE OF MOTION - Extension, Prone Press Ups  Lie on your stomach on the floor, a bed will be too soft. Place your palms about shoulder width apart and at the height of your head.  Keeping your back as relaxed as possible, slowly straighten your elbows while keeping your hips on the floor. You may adjust the placement of your hands to maximize your comfort. As you gain motion, your hands will come more underneath your shoulders.  Hold this position __________ seconds.  Slowly return to lying flat on the floor. Repeat __________ times. Complete this exercise __________ times per day.  STRENGTHENING EXERCISES - Sciatica  These exercises may help you when beginning to rehabilitate your injury. These exercises should be done near your "sweet spot." This is the neutral, low-back arch, somewhere between fully rounded and fully arched, that is your least painful position. When performed in this safe range of motion, these exercises can be used for people who have either a flexion or extension based injury. These exercises may resolve your symptoms with or without further involvement from your physician, physical therapist or athletic trainer. While completing these exercises, remember:   Muscles can gain both the endurance and the strength needed for everyday activities through controlled exercises.  Complete these exercises as instructed by your physician, physical therapist or athletic trainer. Progress with the resistance and repetition exercises only as your caregiver advises.  You may  experience muscle soreness or fatigue, but the pain or discomfort you are trying to eliminate should never worsen during these exercises. If this pain does worsen, stop and make certain you are following the directions exactly. If the pain is still present after adjustments, discontinue the exercise until you can discuss the trouble with your clinician. STRENGTHENING - Deep Abdominals, Pelvic Tilt   Lie on a firm bed or floor. Keeping your legs in front of you, bend your knees so they are both pointed toward the ceiling and your feet are flat on the floor.  Tense your lower abdominal muscles to press your low back into the floor. This motion will rotate your pelvis so that your tail bone is scooping upwards rather than pointing at your feet or into the floor.  With a gentle tension and even breathing, hold this position for __________ seconds. Repeat __________ times. Complete this exercise __________ times per day.  STRENGTHENING - Abdominals, Crunches   Lie on a firm bed or floor. Keeping your legs in front of you, bend your knees so they are both pointed toward the ceiling and your feet are flat on the   floor. Cross your arms over your chest.  Slightly tip your chin down without bending your neck.  Tense your abdominals and slowly lift your trunk high enough to just clear your shoulder blades. Lifting higher can put excessive stress on the low back and does not further strengthen your abdominal muscles.  Control your return to the starting position. Repeat __________ times. Complete this exercise __________ times per day.  STRENGTHENING - Quadruped, Opposite UE/LE Lift  Assume a hands and knees position on a firm surface. Keep your hands under your shoulders and your knees under your hips. You may place padding under your knees for comfort.  Find your neutral spine and gently tense your abdominal muscles so that you can maintain this position. Your shoulders and hips should form a rectangle  that is parallel with the floor and is not twisted.  Keeping your trunk steady, lift your right hand no higher than your shoulder and then your left leg no higher than your hip. Make sure you are not holding your breath. Hold this position __________ seconds.  Continuing to keep your abdominal muscles tense and your back steady, slowly return to your starting position. Repeat with the opposite arm and leg. Repeat __________ times. Complete this exercise __________ times per day.  STRENGTHENING - Abdominals and Quadriceps, Straight Leg Raise   Lie on a firm bed or floor with both legs extended in front of you.  Keeping one leg in contact with the floor, bend the other knee so that your foot can rest flat on the floor.  Find your neutral spine, and tense your abdominal muscles to maintain your spinal position throughout the exercise.  Slowly lift your straight leg off the floor about 6 inches for a count of 15, making sure to not hold your breath.  Still keeping your neutral spine, slowly lower your leg all the way to the floor. Repeat this exercise with each leg __________ times. Complete this exercise __________ times per day. POSTURE AND BODY MECHANICS CONSIDERATIONS - Sciatica Keeping correct posture when sitting, standing or completing your activities will reduce the stress put on different body tissues, allowing injured tissues a chance to heal and limiting painful experiences. The following are general guidelines for improved posture. Your physician or physical therapist will provide you with any instructions specific to your needs. While reading these guidelines, remember:  The exercises prescribed by your provider will help you have the flexibility and strength to maintain correct postures.  The correct posture provides the optimal environment for your joints to work. All of your joints have less wear and tear when properly supported by a spine with good posture. This means you will  experience a healthier, less painful body.  Correct posture must be practiced with all of your activities, especially prolonged sitting and standing. Correct posture is as important when doing repetitive low-stress activities (typing) as it is when doing a single heavy-load activity (lifting). RESTING POSITIONS Consider which positions are most painful for you when choosing a resting position. If you have pain with flexion-based activities (sitting, bending, stooping, squatting), choose a position that allows you to rest in a less flexed posture. You would want to avoid curling into a fetal position on your side. If your pain worsens with extension-based activities (prolonged standing, working overhead), avoid resting in an extended position such as sleeping on your stomach. Most people will find more comfort when they rest with their spine in a more neutral position, neither too rounded nor too   arched. Lying on a non-sagging bed on your side with a pillow between your knees, or on your back with a pillow under your knees will often provide some relief. Keep in mind, being in any one position for a prolonged period of time, no matter how correct your posture, can still lead to stiffness. PROPER SITTING POSTURE In order to minimize stress and discomfort on your spine, you must sit with correct posture Sitting with good posture should be effortless for a healthy body. Returning to good posture is a gradual process. Many people can work toward this most comfortably by using various supports until they have the flexibility and strength to maintain this posture on their own. When sitting with proper posture, your ears will fall over your shoulders and your shoulders will fall over your hips. You should use the back of the chair to support your upper back. Your low back will be in a neutral position, just slightly arched. You may place a small pillow or folded towel at the base of your low back for support.  When  working at a desk, create an environment that supports good, upright posture. Without extra support, muscles fatigue and lead to excessive strain on joints and other tissues. Keep these recommendations in mind: CHAIR:   A chair should be able to slide under your desk when your back makes contact with the back of the chair. This allows you to work closely.  The chair's height should allow your eyes to be level with the upper part of your monitor and your hands to be slightly lower than your elbows. BODY POSITION  Your feet should make contact with the floor. If this is not possible, use a foot rest.  Keep your ears over your shoulders. This will reduce stress on your neck and low back. INCORRECT SITTING POSTURES   If you are feeling tired and unable to assume a healthy sitting posture, do not slouch or slump. This puts excessive strain on your back tissues, causing more damage and pain. Healthier options include:  Using more support, like a lumbar pillow.  Switching tasks to something that requires you to be upright or walking.  Talking a brief walk.  Lying down to rest in a neutral-spine position. PROLONGED STANDING WHILE SLIGHTLY LEANING FORWARD  When completing a task that requires you to lean forward while standing in one place for a long time, place either foot up on a stationary 2-4 inch high object to help maintain the best posture. When both feet are on the ground, the low back tends to lose its slight inward curve. If this curve flattens (or becomes too large), then the back and your other joints will experience too much stress, fatigue more quickly and can cause pain.  CORRECT STANDING POSTURES Proper standing posture should be assumed with all daily activities, even if they only take a few moments, like when brushing your teeth. As in sitting, your ears should fall over your shoulders and your shoulders should fall over your hips. You should keep a slight tension in your abdominal  muscles to brace your spine. Your tailbone should point down to the ground, not behind your body, resulting in an over-extended swayback posture.  INCORRECT STANDING POSTURES  Common incorrect standing postures include a forward head, locked knees and/or an excessive swayback. WALKING Walk with an upright posture. Your ears, shoulders and hips should all line-up. PROLONGED ACTIVITY IN A FLEXED POSITION When completing a task that requires you to bend forward   at your waist or lean over a low surface, try to find a way to stabilize 3 of 4 of your limbs. You can place a hand or elbow on your thigh or rest a knee on the surface you are reaching across. This will provide you more stability so that your muscles do not fatigue as quickly. By keeping your knees relaxed, or slightly bent, you will also reduce stress across your low back. CORRECT LIFTING TECHNIQUES DO :   Assume a wide stance. This will provide you more stability and the opportunity to get as close as possible to the object which you are lifting.  Tense your abdominals to brace your spine; then bend at the knees and hips. Keeping your back locked in a neutral-spine position, lift using your leg muscles. Lift with your legs, keeping your back straight.  Test the weight of unknown objects before attempting to lift them.  Try to keep your elbows locked down at your sides in order get the best strength from your shoulders when carrying an object.  Always ask for help when lifting heavy or awkward objects. INCORRECT LIFTING TECHNIQUES DO NOT:   Lock your knees when lifting, even if it is a small object.  Bend and twist. Pivot at your feet or move your feet when needing to change directions.  Assume that you cannot safely pick up a paperclip without proper posture.   This information is not intended to replace advice given to you by your health care provider. Make sure you discuss any questions you have with your health care provider.     Document Released: 04/22/2005 Document Revised: 09/06/2014 Document Reviewed: 08/04/2008 [employer] Interactive Patient Education 2016 [employer]  

## 2015-08-04 NOTE — ED Notes (Signed)
C/o LBP. Out of pain meds.

## 2015-08-10 ENCOUNTER — Other Ambulatory Visit: Payer: Self-pay | Admitting: Family Medicine

## 2015-08-10 DIAGNOSIS — K5900 Constipation, unspecified: Secondary | ICD-10-CM

## 2015-08-10 MED ORDER — DOCUSATE SODIUM 100 MG PO CAPS: 100.0000 mg | ORAL_CAPSULE | Freq: Every day | ORAL | Status: DC

## 2015-08-10 NOTE — Telephone Encounter (Signed)
Refills for colace has been sent into pharmacy. Thanks!

## 2015-08-23 ENCOUNTER — Telehealth: Payer: Self-pay

## 2015-08-23 NOTE — Telephone Encounter (Signed)
Pt and spouse called in requesting medication for ED concerns. Patient has an upcoming appt w/ NP-[place] on 09/20/15, but would like a med called-in now. Explained policies and procedures.  Patient's wife would like for pt to see Dr. Doreene Burke. Patient states he is willing to see NP or MD to get med prescribed. Advised would fwd message to NP. Patient and spouse agreed.

## 2015-09-07 ENCOUNTER — Emergency Department (HOSPITAL_COMMUNITY)
Admission: EM | Admit: 2015-09-07 | Discharge: 2015-09-07 | Disposition: A | Discharge status: Home / Self Care | Payer: Medicaid Other | Attending: Emergency Medicine | Admitting: Emergency Medicine

## 2015-09-07 ENCOUNTER — Encounter (HOSPITAL_COMMUNITY): Payer: Self-pay | Admitting: Emergency Medicine

## 2015-09-07 DIAGNOSIS — M2550 Pain in unspecified joint: Secondary | ICD-10-CM | POA: Diagnosis not present

## 2015-09-07 DIAGNOSIS — M545 Low back pain: Secondary | ICD-10-CM | POA: Diagnosis not present

## 2015-09-07 DIAGNOSIS — I10 Essential (primary) hypertension: Secondary | ICD-10-CM | POA: Diagnosis not present

## 2015-09-07 DIAGNOSIS — J45909 Unspecified asthma, uncomplicated: Secondary | ICD-10-CM | POA: Diagnosis not present

## 2015-09-07 DIAGNOSIS — R51 Headache: Secondary | ICD-10-CM | POA: Diagnosis not present

## 2015-09-07 DIAGNOSIS — F17210 Nicotine dependence, cigarettes, uncomplicated: Secondary | ICD-10-CM | POA: Diagnosis not present

## 2015-09-07 DIAGNOSIS — M542 Cervicalgia: Secondary | ICD-10-CM | POA: Diagnosis not present

## 2015-09-07 DIAGNOSIS — Z8719 Personal history of other diseases of the digestive system: Secondary | ICD-10-CM | POA: Diagnosis not present

## 2015-09-07 DIAGNOSIS — I2510 Atherosclerotic heart disease of native coronary artery without angina pectoris: Secondary | ICD-10-CM | POA: Insufficient documentation

## 2015-09-07 DIAGNOSIS — N400 Enlarged prostate without lower urinary tract symptoms: Secondary | ICD-10-CM | POA: Diagnosis not present

## 2015-09-07 DIAGNOSIS — Z8659 Personal history of other mental and behavioral disorders: Secondary | ICD-10-CM | POA: Insufficient documentation

## 2015-09-07 MED ORDER — TRAMADOL HCL 50 MG PO TABS: 50.0000 mg | ORAL_TABLET | Freq: Four times a day (QID) | ORAL | Status: DC | PRN

## 2015-09-07 MED ORDER — CYCLOBENZAPRINE HCL 10 MG PO TABS: 10.0000 mg | ORAL_TABLET | Freq: Two times a day (BID) | ORAL | Status: DC | PRN

## 2015-09-07 NOTE — ED Provider Notes (Signed)
Medical screening examination/treatment/procedure(s) were conducted as a shared visit with non-physician practitioner(s) and myself.  I personally evaluated the patient during the encounter.  Pt has chronic neck pain issues.  Has been taking medications but he ran out.  Will refill meds.  Requests referral to pain management.  Dorie Rank, MD 09/07/15 [phone]

## 2015-09-07 NOTE — Discharge Instructions (Signed)
It is important that you follow up with your PCP regarding your neck pain for further evaluation and management. You are provided information for pain management specialist - you can call to schedule an appointment. If your symptoms worsen or you develop new neurologic symptoms such as numbness/tingling/weakness, fever, difficulty swallowing, difficulty breathing, chest pain, or changes in bowel habits follow up in the ED.
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
## 2015-09-11 ENCOUNTER — Encounter: Payer: Self-pay | Admitting: Family Medicine

## 2015-09-11 ENCOUNTER — Ambulatory Visit (INDEPENDENT_AMBULATORY_CARE_PROVIDER_SITE_OTHER): Payer: Medicare Other | Admitting: Family Medicine

## 2015-09-11 VITALS — BP 133/78 | HR 85 | Temp 98.5°F | Resp 16 | Ht 74.0 in | Wt 164.0 lb

## 2015-09-11 DIAGNOSIS — N529 Male erectile dysfunction, unspecified: Secondary | ICD-10-CM | POA: Diagnosis not present

## 2015-09-11 DIAGNOSIS — I10 Essential (primary) hypertension: Secondary | ICD-10-CM

## 2015-09-11 DIAGNOSIS — F17200 Nicotine dependence, unspecified, uncomplicated: Secondary | ICD-10-CM | POA: Diagnosis not present

## 2015-09-11 LAB — POCT URINALYSIS DIP (DEVICE)
Bilirubin Urine: NEGATIVE
GLUCOSE, UA: NEGATIVE mg/dL
KETONES UR: NEGATIVE mg/dL
LEUKOCYTES UA: NEGATIVE
Nitrite: NEGATIVE
PROTEIN: NEGATIVE mg/dL
SPECIFIC GRAVITY, URINE: 1.015 (ref 1.005–1.030)
Urobilinogen, UA: 0.2 mg/dL (ref 0.0–1.0)
pH: 5.5 (ref 5.0–8.0)

## 2015-09-11 LAB — BASIC METABOLIC PANEL WITH GFR
BUN: 8 mg/dL (ref 7–25)
CHLORIDE: 103 mmol/L (ref 98–110)
CO2: 28 mmol/L (ref 20–31)
Calcium: 9.6 mg/dL (ref 8.6–10.3)
Creat: 0.93 mg/dL (ref 0.70–1.25)
GFR, EST NON AFRICAN AMERICAN: 83 mL/min (ref >=60)
GFR, Est African American: >89 mL/min (ref >=60)
Glucose, Bld: 92 mg/dL (ref 65–99)
Potassium: 4.4 mmol/L (ref 3.5–5.3)
SODIUM: 139 mmol/L (ref 135–146)

## 2015-09-11 MED ORDER — SILDENAFIL CITRATE 25 MG PO TABS
25.0000 mg | ORAL_TABLET | Freq: Every day | ORAL | Status: DC | PRN
Start: 2015-09-11 — End: 2016-08-05

## 2015-09-11 NOTE — Progress Notes (Signed)
Subjective:    Patient ID: Steven Lowery, male    DOB: 05-16-1945, 70 y.o.   MRN: [id number]  Hypertension The current episode started more than 1 month ago. The problem has been gradually improving since onset. The problem is controlled. Associated symptoms include neck pain. Pertinent negatives include no anxiety, blurred vision, orthopnea, palpitations, shortness of breath or sweats. There are no associated agents to hypertension. Risk factors for coronary artery disease include dyslipidemia, smoking/tobacco exposure and male gender. Past treatments include ACE inhibitors and calcium channel blockers. The current treatment provides moderate improvement. There are no compliance problems.  There is no history of angina, kidney disease, CAD/MI, CVA, heart failure, left ventricular hypertrophy, PVD or retinopathy.  Erectile Dysfunction This is a recurrent problem. The current episode started more than 1 month ago. The problem is unchanged. The nature of his difficulty is achieving erection and maintaining erection. He reports no anxiety, decreased libido or performance anxiety. Irritative symptoms do not include frequency, nocturia or urgency. Obstructive symptoms include a weak stream. Obstructive symptoms do not include dribbling, incomplete emptying, a slower stream or straining. Pertinent negatives include no chills, genital pain, hematuria or hesitancy. Risk factors include hypertension and tobacco use.   Past Medical History  Diagnosis Date  . Hypertension   . Acute asthmatic bronchitis   . Cigarette smoker   . Coronary artery disease   . IBS (irritable bowel syndrome)   . Benign prostatic hypertrophy   . DJD (degenerative joint disease)   . Neck pain   . Back pain   . Psychiatric disorder   . Alcohol abuse    Social History   Social History  . Marital Status: Married    Spouse Name: N/A  . Number of Children: N/A  . Years of Education: N/A   Occupational History  . Not on  file.   Social History Main Topics  . Smoking status: Current Every Day Smoker -- 0.30 packs/day for 40 years    Types: Cigarettes  . Smokeless tobacco: Never Used     Comment: has cut back alot per pt  . Alcohol Use: Yes  . Drug Use: No  . Sexual Activity: Yes   Other Topics Concern  . Not on file   Social History Narrative   Immunization History  Administered Date(s) Administered  . Influenza Split 01/16/2011, 04/27/2012, 07/16/2013  . Pneumococcal Polysaccharide-23 04/17/2015  . Tdap 12/16/2014  No Known Allergies Review of Systems  Constitutional: Negative.  Negative for chills.  HENT: Negative.   Eyes: Negative.  Negative for blurred vision.  Respiratory: Negative.  Negative for shortness of breath.   Cardiovascular: Negative.  Negative for palpitations and orthopnea.  Gastrointestinal: Negative.  Negative for diarrhea, constipation, blood in stool and rectal pain.  Endocrine: Negative.  Negative for polydipsia, polyphagia and polyuria.  Genitourinary: Negative.  Negative for hesitancy, urgency, frequency, hematuria, testicular pain, decreased libido, incomplete emptying and nocturia.       Erectile dysfunction  Musculoskeletal: Positive for arthralgias and neck pain.  Skin: Negative.   Allergic/Immunologic: Negative.   Neurological: Negative for dizziness.  Hematological: Negative.   Psychiatric/Behavioral: Negative.  Negative for suicidal ideas. The patient is not nervous/anxious.       Objective:   Physical Exam  Constitutional: He is oriented to person, place, and time. He appears well-developed and well-nourished.  HENT:  Head: Normocephalic and atraumatic.  Right Ear: External ear normal.  Left Ear: External ear normal.  Mouth/Throat: Oropharynx is clear and moist.  Eyes: Conjunctivae and EOM are normal. Pupils are equal, round, and reactive to light.  Neck: Rigidity present. Decreased range of motion present. No edema present.  Cardiovascular: Normal  rate, regular rhythm, normal heart sounds and intact distal pulses.   Pulmonary/Chest: Effort normal and breath sounds normal.  Abdominal: Soft. Bowel sounds are normal.  Genitourinary: Rectum normal, prostate normal, testes normal and penis normal. Rectal exam shows no fissure, no tenderness and anal tone normal. Guaiac negative stool. Prostate is not enlarged and not tender.  Neurological: He is alert and oriented to person, place, and time. He has normal reflexes.  Skin: Skin is warm and dry.  Psychiatric: He has a normal mood and affect. His behavior is normal. Judgment and thought content normal.     BP 133/78 mmHg  Pulse 85  Temp(Src) 98.5 F (36.9 C) (Oral)  Resp 16  Ht 6\' 2"  (1.88 m)  Wt 164 lb (74.39 kg)  BMI 21.05 kg/m2  SpO2 100% Assessment & Plan:  1. Essential hypertension Blood pressure is  at goal on current medication regimen. Will continue daily medications. Patient also given information on DASH diet. Will check urine for protein.   - BASIC METABOLIC PANEL WITH GFR  2. Erectile dysfunction, unspecified erectile dysfunction type Discussed potential side effects at length, patient expressed understanding.   - sildenafil (VIAGRA) 25 MG tablet; Take 1 tablet (25 mg total) by mouth daily as needed for erectile dysfunction.  Dispense: 20 tablet; Refill: 1 - IFOBT POC (occult bld, rslt in office)  3. Tobacco dependence Smoking cessation instruction/counseling given:  counseled patient on the dangers of tobacco use, advised patient to stop smoking, and reviewed strategies to maximize success      The patient was given clear instructions to go to ER or return to medical center if symptoms do not improve, worsen or new problems develop. The patient verbalized understanding. Will notify patient with laboratory results. Dorena Dew, FNP

## 2015-09-11 NOTE — Patient Instructions (Addendum)
Do not take Sildenafil tablets and nitroglycerin together. Explained to Mr. Steven Lowery that the combination can cause an abnormal decrease in blood pressure. Also, discussed that patient will need to report to the emergency department for erection lasting greater than 4 hours.     Erectile Dysfunction Erectile dysfunction is the inability to get or sustain a good enough erection to have sexual intercourse. Erectile dysfunction may involve:  Inability to get an erection.  Lack of enough hardness to allow penetration.  Loss of the erection before sex is finished.  Premature ejaculation. CAUSES  Certain drugs, such as:  Pain relievers.  Antihistamines.  Antidepressants.  Blood pressure medicines.  Water pills (diuretics).  Ulcer medicines.  Muscle relaxants.  Illegal drugs.  Excessive drinking.  Psychological causes, such as:  Anxiety.  Depression.  Sadness.  Exhaustion.  Performance fear.  Stress.  Physical causes, such as:  Artery problems. This may include diabetes, smoking, liver disease, or atherosclerosis.  High blood pressure.  Hormonal problems, such as low testosterone.  Obesity.  Nerve problems. This may include back or pelvic injuries, diabetes mellitus, multiple sclerosis, or Parkinson disease. SYMPTOMS  Inability to get an erection.  Lack of enough hardness to allow penetration.  Loss of the erection before sex is finished.  Premature ejaculation.  Normal erections at some times, but with frequent unsatisfactory episodes.  Orgasms that are not satisfactory in sensation or frequency.  Low sexual satisfaction in either partner because of erection problems.  A curved penis occurring with erection. The curve may cause pain or may be too curved to allow for intercourse.  Never having nighttime erections. DIAGNOSIS Your caregiver can often diagnose this condition by:  Performing a physical exam to find other diseases or specific  problems with the penis.  Asking you detailed questions about the problem.  Performing blood tests to check for diabetes mellitus or to measure hormone levels.  Performing urine tests to find other underlying health conditions.  Performing an ultrasound exam to check for scarring.  Performing a test to check blood flow to the penis.  Doing a sleep study at home to measure nighttime erections. TREATMENT   You may be prescribed medicines by mouth.  You may be given medicine injections into the penis.  You may be prescribed a vacuum pump with a ring.  Penile implant surgery may be performed. You may receive:  An inflatable implant.  A semirigid implant.  Blood vessel surgery may be performed. HOME CARE INSTRUCTIONS  If you are prescribed oral medicine, you should take the medicine as prescribed. Do not increase the dosage without first discussing it with your physician.  If you are using self-injections, be careful to avoid any veins that are on the surface of the penis. Apply pressure to the injection site for 5 minutes.  If you are using a vacuum pump, make sure you have read the instructions before using it. Discuss any questions with your physician before taking the pump home. SEEK MEDICAL CARE IF:  You experience pain that is not responsive to the pain medicine you have been prescribed.  You experience nausea or vomiting. SEEK IMMEDIATE MEDICAL CARE IF:   When taking oral or injectable medications, you experience an erection that lasts longer than 4 hours. If your physician is unavailable, go to the nearest emergency room for evaluation. An erection that lasts much longer than 4 hours can result in permanent damage to your penis.  You have pain that is severe.  You develop redness, severe  pain, or severe swelling of your penis.  You have redness spreading up into your groin or lower abdomen.  You are unable to pass your urine.   This information is not intended  to replace advice given to you by your health care provider. Make sure you discuss any questions you have with your health care provider.   Document Released: 04/19/2000 Document Revised: 12/23/2012 Document Reviewed: 09/24/2012 Elsevier Interactive Patient Education 2016 [employer]. Sildenafil tablets (Viagra) What is this medicine? SILDENAFIL (sil DEN a fil) is used to treat erection problems in men. This medicine may be used for other purposes; ask your health care provider or pharmacist if you have questions. What should I tell my health care provider before I take this medicine? They need to know if you have any of these conditions: -bleeding disorders -eye or vision problems, including a rare inherited eye disease called retinitis pigmentosa -anatomical deformation of the penis, Peyronie's disease, or history of priapism (painful and prolonged erection) -heart disease, angina, a history of heart attack, irregular heart beats, or other heart problems -high or low blood pressure -history of blood diseases, like sickle cell anemia or leukemia -history of stomach bleeding -kidney disease -liver disease -stroke -an unusual or allergic reaction to sildenafil, other medicines, foods, dyes, or preservatives -pregnant or trying to get pregnant -breast-feeding How should I use this medicine? Take this medicine by mouth with a glass of water. Follow the directions on the prescription label. The dose is usually taken 1 hour before sexual activity. You should not take the dose more than once per day. Do not take your medicine more often than directed. Talk to your pediatrician regarding the use of this medicine in children. This medicine is not used in children for this condition. Overdosage: If you think you have taken too much of this medicine contact a poison control center or emergency room at once. NOTE: This medicine is only for you. Do not share this medicine with others. What if I  miss a dose? This does not apply. Do not take double or extra doses. What may interact with this medicine? Do not take this medicine with any of the following medications: -cisapride -methscopolamine nitrate -nitrates like amyl nitrite, isosorbide dinitrate, isosorbide mononitrate, nitroglycerin -nitroprusside -other medicines for erectile dysfunction like avanafil, tadalafil, vardenafil -riociguat -other sildenafil products (Revatio) This medicine may also interact with the following medications: -certain drugs for high blood pressure -certain drugs for the treatment of HIV infection or AIDS -certain drugs used for fungal or yeast infections, like fluconazole, itraconazole, ketoconazole, and voriconazole -cimetidine -erythromycin -rifampin This list may not describe all possible interactions. Give your health care provider a list of all the medicines, herbs, non-prescription drugs, or dietary supplements you use. Also tell them if you smoke, drink alcohol, or use illegal drugs. Some items may interact with your medicine. What should I watch for while using this medicine? If you notice any changes in your vision while taking this drug, call your doctor or health care professional as soon as possible. Stop using this medicine and call your health care provider right away if you have a loss of sight in one or both eyes. Contact your doctor or health care professional right away if you have an erection that lasts longer than 4 hours or if it becomes painful. This may be a sign of a serious problem and must be treated right away to prevent permanent damage. If you experience symptoms of nausea, dizziness, chest pain or  arm pain upon initiation of sexual activity after taking this medicine, you should refrain from further activity and call your doctor or health care professional as soon as possible. Do not drink alcohol to excess (examples, 5 glasses of wine or 5 shots of whiskey) when taking this  medicine. When taken in excess, alcohol can increase your chances of getting a headache or getting dizzy, increasing your heart rate or lowering your blood pressure. Using this medicine does not protect you or your partner against HIV infection (the virus that causes AIDS) or other sexually transmitted diseases. What side effects may I notice from receiving this medicine? Side effects that you should report to your doctor or health care professional as soon as possible: -allergic reactions like skin rash, itching or hives, swelling of the face, lips, or tongue -breathing problems -changes in hearing -changes in vision -chest pain -fast, irregular heartbeat -prolonged or painful erection -seizures Side effects that usually do not require medical attention (report to your doctor or health care professional if they continue or are bothersome): -back pain -dizziness -flushing -headache -indigestion -muscle aches -nausea -stuffy or runny nose This list may not describe all possible side effects. Call your doctor for medical advice about side effects. You may report side effects to FDA at 1-800-FDA-1088. Where should I keep my medicine? Keep out of reach of children. Store at room temperature between 15 and 30 degrees C (59 and 86 degrees F). Throw away any unused medicine after the expiration date. NOTE: This sheet is a summary. It may not cover all possible information. If you have questions about this medicine, talk to your doctor, pharmacist, or health care provider.    2016, Elsevier/Gold Standard. (2013-09-10 13:19:04)

## 2015-09-16 ENCOUNTER — Emergency Department (HOSPITAL_COMMUNITY)
Admission: EM | Admit: 2015-09-16 | Discharge: 2015-09-16 | Disposition: A | Discharge status: Home / Self Care | Payer: Medicare Other | Attending: Emergency Medicine | Admitting: Emergency Medicine

## 2015-09-16 ENCOUNTER — Encounter (HOSPITAL_COMMUNITY): Payer: Self-pay | Admitting: Emergency Medicine

## 2015-09-16 DIAGNOSIS — Z8659 Personal history of other mental and behavioral disorders: Secondary | ICD-10-CM | POA: Insufficient documentation

## 2015-09-16 DIAGNOSIS — N400 Enlarged prostate without lower urinary tract symptoms: Secondary | ICD-10-CM | POA: Insufficient documentation

## 2015-09-16 DIAGNOSIS — G8929 Other chronic pain: Secondary | ICD-10-CM | POA: Diagnosis not present

## 2015-09-16 DIAGNOSIS — Z7982 Long term (current) use of aspirin: Secondary | ICD-10-CM | POA: Diagnosis not present

## 2015-09-16 DIAGNOSIS — M545 Low back pain, unspecified: Secondary | ICD-10-CM

## 2015-09-16 DIAGNOSIS — M542 Cervicalgia: Secondary | ICD-10-CM | POA: Insufficient documentation

## 2015-09-16 DIAGNOSIS — I2510 Atherosclerotic heart disease of native coronary artery without angina pectoris: Secondary | ICD-10-CM | POA: Insufficient documentation

## 2015-09-16 DIAGNOSIS — Z79899 Other long term (current) drug therapy: Secondary | ICD-10-CM | POA: Diagnosis not present

## 2015-09-16 DIAGNOSIS — I10 Essential (primary) hypertension: Secondary | ICD-10-CM | POA: Insufficient documentation

## 2015-09-16 DIAGNOSIS — F17210 Nicotine dependence, cigarettes, uncomplicated: Secondary | ICD-10-CM | POA: Insufficient documentation

## 2015-09-16 MED ORDER — TRAMADOL HCL 50 MG PO TABS
50.0000 mg | ORAL_TABLET | Freq: Once | ORAL | Status: AC
  Administered 2015-09-16: 50 mg via ORAL
  Filled 2015-09-16: qty 1

## 2015-09-16 MED ORDER — ACETAMINOPHEN 325 MG PO TABS: 650.0000 mg | ORAL_TABLET | Freq: Four times a day (QID) | ORAL | Status: DC | PRN

## 2015-09-16 NOTE — Discharge Instructions (Signed)
Back Pain, Adult °Back pain is very common in adults. The cause of back pain is rarely dangerous and the pain often gets better over time. The cause of your back pain may not be known. Some common causes of back pain include: °1. Strain of the muscles or ligaments supporting the spine. °2. Wear and tear (degeneration) of the spinal disks. °3. Arthritis. °4. Direct injury to the back. °For many people, back pain may return. Since back pain is rarely dangerous, most people can learn to manage this condition on their own. °HOME CARE INSTRUCTIONS °Watch your back pain for any changes. The following actions may help to lessen any discomfort you are feeling: °1. Remain active. It is stressful on your back to sit or stand in one place for long periods of time. Do not sit, drive, or stand in one place for more than 30 minutes at a time. Take short walks on even surfaces as soon as you are able. Try to increase the length of time you walk each day. °2. Exercise regularly as directed by your health care provider. Exercise helps your back heal faster. It also helps avoid future injury by keeping your muscles strong and flexible. °3. Do not stay in bed. Resting more than 1-2 days can delay your recovery. °4. Pay attention to your body when you bend and lift. The most comfortable positions are those that put less stress on your recovering back. Always use proper lifting techniques, including: °1. Bending your knees. °2. Keeping the load close to your body. °3. Avoiding twisting. °5. Find a comfortable position to sleep. Use a firm mattress and lie on your side with your knees slightly bent. If you lie on your back, put a pillow under your knees. °6. Avoid feeling anxious or stressed. Stress increases muscle tension and can worsen back pain. It is important to recognize when you are anxious or stressed and learn ways to manage it, such as with exercise. °7. Take medicines only as directed by your health care provider.  Over-the-counter medicines to reduce pain and inflammation are often the most helpful. Your health care provider may prescribe muscle relaxant drugs. These medicines help dull your pain so you can more quickly return to your normal activities and healthy exercise. °8. Apply ice to the injured area: °1. Put ice in a plastic bag. °2. Place a towel between your skin and the bag. °3. Leave the ice on for 20 minutes, 2-3 times a day for the first 2-3 days. After that, ice and heat may be alternated to reduce pain and spasms. °9. Maintain a healthy weight. Excess weight puts extra stress on your back and makes it difficult to maintain good posture. °SEEK MEDICAL CARE IF: °1. You have pain that is not relieved with rest or medicine. °2. You have increasing pain going down into the legs or buttocks. °3. You have pain that does not improve in one week. °4. You have night pain. °5. You lose weight. °6. You have a fever or chills. °SEEK IMMEDIATE MEDICAL CARE IF:  °1. You develop new bowel or bladder control problems. °2. You have unusual weakness or numbness in your arms or legs. °3. You develop nausea or vomiting. °4. You develop abdominal pain. °5. You feel faint. °  °This information is not intended to replace advice given to you by your health care provider. Make sure you discuss any questions you have with your health care provider. °  °Document Released: 04/22/2005 Document Revised: 05/13/2014 Document Reviewed: 08/24/2013 °Elsevier Interactive Patient Education ©2016 [employer] ° °Back Exercises °The following exercises strengthen the muscles that help to support the back. They also help to keep the lower back flexible. Doing these exercises can help to prevent back pain or lessen existing pain. °If you have back pain or discomfort, try doing these exercises 2-3 times each day or as told by your health care provider. When the pain goes away, do them once each day, but increase the number of times that you repeat the  steps for each exercise (do more repetitions). If you do not have back pain or discomfort, do these exercises once each day or as told by your health care provider. °EXERCISES °Single Knee to Chest °Repeat these steps 3-5 times for each leg: °5. Lie on your back on a firm bed or the floor with your legs extended. °6. Bring one knee to your chest. Your other leg should stay extended and in contact with the floor. °7. Hold your knee in place by grabbing your knee or thigh. °8. Pull on your knee until you feel a gentle stretch in your lower back. °9. Hold the stretch for 10-30 seconds. °10. Slowly release and straighten your leg. °Pelvic Tilt °Repeat these steps 5-10 times: °10. Lie on your back on a firm bed or the floor with your legs extended. °11. Bend your knees so they are pointing toward the ceiling and your feet are flat on the floor. °12. Tighten your lower abdominal muscles to press your lower back against the floor. This motion will tilt your pelvis so your tailbone points up toward the ceiling instead of pointing to your feet or the floor. °13. With gentle tension and even breathing, hold this position for 5-10 seconds. °Cat-Cow °Repeat these steps until your lower back becomes more flexible: °7. Get into a hands-and-knees position on a firm surface. Keep your hands under your shoulders, and keep your knees under your hips. You may place padding under your knees for comfort. °8. Let your head hang down, and point your tailbone toward the floor so your lower back becomes rounded like the back of a cat. °9. Hold this position for 5 seconds. °10. Slowly lift your head and point your tailbone up toward the ceiling so your back forms a sagging arch like the back of a cow. °11. Hold this position for 5 seconds. °Press-Ups °Repeat these steps 5-10 times: °6. Lie on your abdomen (face-down) on the floor. °7. Place your palms near your head, about shoulder-width apart. °8. While you keep your back as relaxed as  possible and keep your hips on the floor, slowly straighten your arms to raise the top half of your body and lift your shoulders. Do not use your back muscles to raise your upper torso. You may adjust the placement of your hands to make yourself more comfortable. °9. Hold this position for 5 seconds while you keep your back relaxed. °10. Slowly return to lying flat on the floor. °Bridges °Repeat these steps 10 times: °1. Lie on your back on a firm surface. °2. Bend your knees so they are pointing toward the ceiling and your feet are flat on the floor. °3. Tighten your buttocks muscles and lift your buttocks off of the floor until your waist is at almost the same height as your knees. You should feel the muscles working in your buttocks and the back of your thighs. If you do not feel these muscles, slide your feet 1-2 inches farther away from your buttocks. °4. Hold this   position for 3-5 seconds. °5. Slowly lower your hips to the starting position, and allow your buttocks muscles to relax completely. °If this exercise is too easy, try doing it with your arms crossed over your chest. °Abdominal Crunches °Repeat these steps 5-10 times: °1. Lie on your back on a firm bed or the floor with your legs extended. °2. Bend your knees so they are pointing toward the ceiling and your feet are flat on the floor. °3. Cross your arms over your chest. °4. Tip your chin slightly toward your chest without bending your neck. °5. Tighten your abdominal muscles and slowly raise your trunk (torso) high enough to lift your shoulder blades a tiny bit off of the floor. Avoid raising your torso higher than that, because it can put too much stress on your low back and it does not help to strengthen your abdominal muscles. °6. Slowly return to your starting position. °Back Lifts °Repeat these steps 5-10 times: °1. Lie on your abdomen (face-down) with your arms at your sides, and rest your forehead on the floor. °2. Tighten the muscles in your  legs and your buttocks. °3. Slowly lift your chest off of the floor while you keep your hips pressed to the floor. Keep the back of your head in line with the curve in your back. Your eyes should be looking at the floor. °4. Hold this position for 3-5 seconds. °5. Slowly return to your starting position. °SEEK MEDICAL CARE IF: °· Your back pain or discomfort gets much worse when you do an exercise. °· Your back pain or discomfort does not lessen within 2 hours after you exercise. °If you have any of these problems, stop doing these exercises right away. Do not do them again unless your health care provider says that you can. °SEEK IMMEDIATE MEDICAL CARE IF: °· You develop sudden, severe back pain. If this happens, stop doing the exercises right away. Do not do them again unless your health care provider says that you can. °  °This information is not intended to replace advice given to you by your health care provider. Make sure you discuss any questions you have with your health care provider. °  °Document Released: 05/30/2004 Document Revised: 01/11/2015 Document Reviewed: 06/16/2014 °Elsevier Interactive Patient Education ©2016 [employer] ° °

## 2015-09-16 NOTE — ED Notes (Signed)
Pt here for med refill. Hx of chronic neck and back pain.

## 2015-09-16 NOTE — ED Provider Notes (Signed)
CSN: [id number]     Arrival date & time 09/16/15  1020 History  By signing my name below, I, Eustaquio Maize, attest that this documentation has been prepared under the direction and in the presence of Waynetta Pean, PA-C. Electronically Signed: Eustaquio Maize, ED Scribe. 09/16/2015. 11:53 AM.   Chief Complaint  Patient presents with  . Neck Pain  . Back Pain   The history is provided by the patient. No language interpreter was used.    HPI Comments: Steven Lowery is a 70 y.o. male with PMHx chronic neck and back pain, who presents to the Emergency Department for acute on chronic, 9/10, neck and back pain after running out of his pain medication 2 days ago. Pt typically takes Tramadol for his 20-30 year chronic pain and sees his PCP for this. He reports that he recently saw his PCP and she is attempting to get him placed into a pain clinic. He has not heard back from his PCP and is planning on calling after the weekend to check on the status. Pt took 2 Tylenol and 81 mg Aspirin at 6 AM this morning (approximately 6 hours ago) with no relief. No new back or neck pain. Denies urinary or bowel incontinence, difficulty urinating, dysuria, abdominal pain, nausea, vomiting, weakness, numbness, tingling, or any other associated symptoms.   Per chart review: Pt was seen in the ED on 09/07/2015 (approximately 10 days ago) for chronic neck and back pain exacerbation. He presented to the ED after running out of his pain medication and an inability to see PCP until 09/11/2015. Pt was given #21 50 mg Tramadol and #14 10 mg Flexeril. He was seen by his PCP, Cammie Sickle FNP, on 09/11/2015 but was not prescribed any pain medication at that time.    Past Medical History  Diagnosis Date  . Hypertension   . Acute asthmatic bronchitis   . Cigarette smoker   . Coronary artery disease   . IBS (irritable bowel syndrome)   . Benign prostatic hypertrophy   . DJD (degenerative joint disease)   . Neck pain   .  Back pain   . Psychiatric disorder   . Alcohol abuse    Past Surgical History  Procedure Laterality Date  . Cystoscopy  1999    Dr. Janice Norrie   No family history on file. Social History  Substance Use Topics  . Smoking status: Current Every Day Smoker -- 0.30 packs/day for 40 years    Types: Cigarettes  . Smokeless tobacco: Never Used     Comment: has cut back alot per pt  . Alcohol Use: Yes    Review of Systems  Constitutional: Negative for fever.  Respiratory: Negative for shortness of breath.   Cardiovascular: Negative for chest pain.  Gastrointestinal: Negative for nausea, vomiting and abdominal pain.       Negative for bowel incontinence  Genitourinary: Negative for dysuria, urgency, hematuria and difficulty urinating.       Negative for urinary incontinence  Musculoskeletal: Positive for back pain and neck pain.  Skin: Negative for rash.  Neurological: Negative for weakness and numbness.   Allergies  Review of patient's allergies indicates no known allergies.  Home Medications   Prior to Admission medications   Medication Sig Start Date End Date Taking? Authorizing Provider  acetaminophen (TYLENOL) 325 MG tablet Take 2 tablets (650 mg total) by mouth every 6 (six) hours as needed for moderate pain. 09/16/15   Waynetta Pean, PA-C  amLODipine (NORVASC) 5 MG  tablet Take 1 tablet (5 mg total) by mouth daily. 08/03/15   Dorena Dew, FNP  aspirin EC 81 MG tablet Take 1 tablet (81 mg total) by mouth daily. 08/03/15   Dorena Dew, FNP  Camphor-Eucalyptus-Menthol (ICY HOT NO-MESS VAPOR GEL EX) Apply 1 application topically daily as needed (hip pain).    Historical Provider, MD  cyclobenzaprine (FLEXERIL) 10 MG tablet Take 1 tablet (10 mg total) by mouth 2 (two) times daily as needed for muscle spasms. Patient not taking: Reported on 09/11/2015 09/07/15   Carlisle Cater, PA-C  docusate sodium (COLACE) 100 MG capsule Take 1 capsule (100 mg total) by mouth daily. Patient not  taking: Reported on 09/11/2015 08/10/15   Dorena Dew, FNP  fish oil-omega-3 fatty acids 1000 MG capsule Take 1 g by mouth daily.     Historical Provider, MD  lisinopril (PRINIVIL,ZESTRIL) 20 MG tablet Take 1 tablet (20 mg total) by mouth daily. Patient not taking: Reported on 09/11/2015 08/03/15   Dorena Dew, FNP  Multiple Vitamin (MULTIVITAMIN WITH MINERALS) TABS tablet Take 1 tablet by mouth daily.    Historical Provider, MD  nitroGLYCERIN (NITROSTAT) 0.4 MG SL tablet Place 1 tablet (0.4 mg total) under the tongue every 5 (five) minutes as needed (If you have possible cardiac chest pain not relieved by 3 NTG, call EMS.). For chest pain Patient not taking: Reported on 04/17/2015 11/14/14   Micheline Chapman, NP  OVER THE COUNTER MEDICATION Place 1 drop into both eyes daily as needed (Eye burning). Reported on 09/11/2015    Historical Provider, MD  polyethylene glycol (MIRALAX / GLYCOLAX) packet Take 17 g by mouth daily. 04/27/15   Tatyana Kirichenko, PA-C  sildenafil (VIAGRA) 25 MG tablet Take 1 tablet (25 mg total) by mouth daily as needed for erectile dysfunction. 09/11/15   Dorena Dew, FNP  sodium chloride (OCEAN) 0.65 % SOLN nasal spray Place 1 spray into both nostrils as needed for congestion. 06/03/14   Dahlia Bailiff, PA-C  tamsulosin (FLOMAX) 0.4 MG CAPS capsule Take 1 capsule (0.4 mg total) by mouth daily. 08/03/15   Dorena Dew, FNP  tamsulosin (FLOMAX) 0.4 MG CAPS capsule take 1 capsule by mouth once daily 08/10/15   Dorena Dew, FNP  traMADol (ULTRAM) 50 MG tablet Take 1 tablet (50 mg total) by mouth every 6 (six) hours as needed. 09/07/15   Carlisle Cater, PA-C  vitamin C (ASCORBIC ACID) 500 MG tablet Take 500 mg by mouth daily. Reported on 09/11/2015    Historical Provider, MD   BP 107/78 mmHg  Pulse 87  Temp(Src) 97.8 F (36.6 C) (Oral)  Resp 16  Ht 6\' 2"  (1.88 m)  Wt 74.345 kg  BMI 21.03 kg/m2  SpO2 100%   Physical Exam  Constitutional: He appears well-developed and  well-nourished. No distress.  Nontoxic appearing.  HENT:  Head: Normocephalic and atraumatic.  Eyes: Conjunctivae are normal. Pupils are equal, round, and reactive to light. Right eye exhibits no discharge. Left eye exhibits no discharge.  Neck: Neck supple.  Cardiovascular: Normal rate, regular rhythm, normal heart sounds and intact distal pulses.   Pulmonary/Chest: Effort normal and breath sounds normal. No respiratory distress. He has no wheezes. He has no rales.  Abdominal: Soft. There is no tenderness.  Musculoskeletal: Normal range of motion. He exhibits no edema or tenderness.  No midline back or neck tenderness.  No lower extremity edema or tenderness.  Good strength to his bilateral upper and lower extremities.  Lymphadenopathy:    He has no cervical adenopathy.  Neurological: He is alert. He has normal reflexes. He displays normal reflexes. Coordination normal.  Good strength to BUEs and BLEs.  Bilateral patellar DTRs are intact.  Neurovascularly intact.  Sensation intact to BUEs and BLEs.  Ambulates with cane.   Skin: Skin is warm and dry. No rash noted. He is not diaphoretic. No erythema. No pallor.  Psychiatric: He has a normal mood and affect. His behavior is normal.  Nursing note and vitals reviewed.   ED Course  Procedures (including critical care time)  DIAGNOSTIC STUDIES: Oxygen Saturation is 100% on RA, normal by my interpretation.    COORDINATION OF CARE: 11:48 AM-Discussed treatment plan which includes Tramadol tablet in ED and Rx Tylenol with pt at bedside and pt agreed to plan.    MDM   Meds given in ED:  Medications  traMADol (ULTRAM) tablet 50 mg (not administered)    New Prescriptions   ACETAMINOPHEN (TYLENOL) 325 MG TABLET    Take 2 tablets (650 mg total) by mouth every 6 (six) hours as needed for moderate pain.    Final diagnoses:  Chronic neck pain  Chronic low back pain    This is a 70 y.o. male with PMHx chronic neck and back pain,  who presents to the Emergency Department for acute on chronic, 9/10, neck and back pain after running out of his pain medication 2 days ago. Pt typically takes Tramadol for his 20-30 year chronic pain and sees his PCP for this. He reports that he recently saw his PCP and she is attempting to get him placed into a pain clinic. He has not heard back from his PCP and is planning on calling after the weekend to check on the status. Pt took 2 Tylenol and 81 mg Aspirin at 6 AM this morning (approximately 6 hours ago) with no relief. No new back or neck pain.  Per chart review: Pt was seen in the ED on 09/07/2015 (approximately 10 days ago) for chronic neck and back pain exacerbation. He presented to the ED after running out of his pain medication and an inability to see PCP until 09/11/2015. Pt was given #21 50 mg Tramadol and #14 10 mg Flexeril. He was seen by his PCP, Cammie Sickle FNP, on 09/11/2015 but was not prescribed any pain medication at that time.   Patient with chronic neck and back pain.  No neurological deficits and normal neuro exam.  Patient can walk with his cane, which is his baseline.  No loss of bowel or bladder control.  No concern for cauda equina.  No fever, night sweats, weight loss, h/o cancer, IVDU.  In less than 60 days the patient has been to the emergency department twice and has received refills on his narcotic pain medicine. I do not want to encourage the patient to come back continuously for his chronic pain and narcotic pain medication refills when he is seeing his primary care provider and she is not refilling his prescriptions. I encouraged him to follow-up with primary care and pain management. I will provide him with nonnarcotic pain medication options for his back pain. I provided him with a tramadol in the emergency department. I discussed again specific return precautions. I advised the patient to follow-up with their primary care provider this week. I advised the patient to  return to the emergency department with new or worsening symptoms or new concerns. The patient verbalized understanding and agreement with plan.  I personally performed the services described in this documentation, which was scribed in my presence. The recorded information has been reviewed and is accurate.        Waynetta Pean, PA-C 09/16/15 1159  Julianne Rice, MD 09/16/15 1225

## 2015-09-16 NOTE — ED Notes (Signed)
Pt c/o neck for 1 year and back pain ongoing for 20-30 years. Pt seen by PMD for same and is in process of trying to get into a pain clinic. Pt is out of prescribed pain medications as of 2 days ago.

## 2015-09-20 ENCOUNTER — Ambulatory Visit: Payer: Commercial Managed Care - HMO | Admitting: Family Medicine

## 2015-09-28 ENCOUNTER — Emergency Department (HOSPITAL_COMMUNITY)
Admission: EM | Admit: 2015-09-28 | Discharge: 2015-09-29 | Disposition: A | Discharge status: Home / Self Care | Payer: Medicare Other | Attending: Emergency Medicine | Admitting: Emergency Medicine

## 2015-09-28 ENCOUNTER — Emergency Department (HOSPITAL_COMMUNITY): Payer: Medicare Other

## 2015-09-28 ENCOUNTER — Encounter (HOSPITAL_COMMUNITY): Payer: Self-pay | Admitting: Emergency Medicine

## 2015-09-28 DIAGNOSIS — I2510 Atherosclerotic heart disease of native coronary artery without angina pectoris: Secondary | ICD-10-CM | POA: Diagnosis not present

## 2015-09-28 DIAGNOSIS — F17210 Nicotine dependence, cigarettes, uncomplicated: Secondary | ICD-10-CM | POA: Diagnosis not present

## 2015-09-28 DIAGNOSIS — R42 Dizziness and giddiness: Secondary | ICD-10-CM | POA: Insufficient documentation

## 2015-09-28 DIAGNOSIS — Z7982 Long term (current) use of aspirin: Secondary | ICD-10-CM | POA: Diagnosis not present

## 2015-09-28 DIAGNOSIS — I10 Essential (primary) hypertension: Secondary | ICD-10-CM | POA: Diagnosis not present

## 2015-09-28 DIAGNOSIS — Z79899 Other long term (current) drug therapy: Secondary | ICD-10-CM | POA: Diagnosis not present

## 2015-09-28 DIAGNOSIS — M1990 Unspecified osteoarthritis, unspecified site: Secondary | ICD-10-CM | POA: Diagnosis not present

## 2015-09-28 DIAGNOSIS — R1031 Right lower quadrant pain: Secondary | ICD-10-CM | POA: Insufficient documentation

## 2015-09-28 DIAGNOSIS — R55 Syncope and collapse: Secondary | ICD-10-CM | POA: Insufficient documentation

## 2015-09-28 DIAGNOSIS — N400 Enlarged prostate without lower urinary tract symptoms: Secondary | ICD-10-CM | POA: Insufficient documentation

## 2015-09-28 DIAGNOSIS — R1030 Lower abdominal pain, unspecified: Secondary | ICD-10-CM

## 2015-09-28 DIAGNOSIS — Z8659 Personal history of other mental and behavioral disorders: Secondary | ICD-10-CM | POA: Insufficient documentation

## 2015-09-28 DIAGNOSIS — R110 Nausea: Secondary | ICD-10-CM | POA: Diagnosis not present

## 2015-09-28 DIAGNOSIS — R1032 Left lower quadrant pain: Secondary | ICD-10-CM | POA: Insufficient documentation

## 2015-09-28 DIAGNOSIS — G8929 Other chronic pain: Secondary | ICD-10-CM | POA: Diagnosis not present

## 2015-09-28 DIAGNOSIS — J45909 Unspecified asthma, uncomplicated: Secondary | ICD-10-CM | POA: Diagnosis not present

## 2015-09-28 DIAGNOSIS — K5900 Constipation, unspecified: Secondary | ICD-10-CM | POA: Insufficient documentation

## 2015-09-28 LAB — CBC WITH DIFFERENTIAL/PLATELET
BASOS ABS: 0 10*3/uL (ref 0.0–0.1)
Basophils Relative: 0 %
EOS PCT: 2 %
Eosinophils Absolute: 0.1 10*3/uL (ref 0.0–0.7)
HCT: 37.2 % — ABNORMAL LOW (ref 39.0–52.0)
Hemoglobin: 12.5 g/dL — ABNORMAL LOW (ref 13.0–17.0)
LYMPHS ABS: 1.7 10*3/uL (ref 0.7–4.0)
LYMPHS PCT: 25 %
MCH: 29.6 pg (ref 26.0–34.0)
MCHC: 33.6 g/dL (ref 30.0–36.0)
MCV: 87.9 fL (ref 78.0–100.0)
MONO ABS: 0.4 10*3/uL (ref 0.1–1.0)
MONOS PCT: 6 %
Neutro Abs: 4.4 10*3/uL (ref 1.7–7.7)
Neutrophils Relative %: 67 %
PLATELETS: 200 10*3/uL (ref 150–400)
RBC: 4.23 MIL/uL (ref 4.22–5.81)
RDW: 13.8 % (ref 11.5–15.5)
WBC: 6.6 10*3/uL (ref 4.0–10.5)

## 2015-09-28 LAB — I-STAT CG4 LACTIC ACID, ED: LACTIC ACID, VENOUS: 0.8 mmol/L (ref 0.5–2.0)

## 2015-09-28 LAB — COMPREHENSIVE METABOLIC PANEL
ALT: 15 U/L — ABNORMAL LOW (ref 17–63)
ANION GAP: 3 — AB (ref 5–15)
AST: 17 U/L (ref 15–41)
Albumin: 3.8 g/dL (ref 3.5–5.0)
Alkaline Phosphatase: 66 U/L (ref 38–126)
BILIRUBIN TOTAL: 0.4 mg/dL (ref 0.3–1.2)
BUN: 14 mg/dL (ref 6–20)
CHLORIDE: 105 mmol/L (ref 101–111)
CO2: 29 mmol/L (ref 22–32)
Calcium: 9.6 mg/dL (ref 8.9–10.3)
Creatinine, Ser: 1.17 mg/dL (ref 0.61–1.24)
Glucose, Bld: 106 mg/dL — ABNORMAL HIGH (ref 65–99)
POTASSIUM: 3.9 mmol/L (ref 3.5–5.1)
Sodium: 137 mmol/L (ref 135–145)
TOTAL PROTEIN: 6.5 g/dL (ref 6.5–8.1)

## 2015-09-28 LAB — I-STAT TROPONIN, ED: Troponin i, poc: 0.01 ng/mL (ref 0.00–0.08)

## 2015-09-28 LAB — LIPASE, BLOOD: LIPASE: 30 U/L (ref 11–51)

## 2015-09-28 NOTE — ED Notes (Signed)
Patient transported to imaging.

## 2015-09-28 NOTE — ED Notes (Signed)
Pt arrives by [facility] with c/o bilateral lower quadrant abdominal pain 2/10, tenderness and mild distension. Post dinner, felt nauseous, went to get up and felt dizzy and lightheaded. Pt has chronic back pain, takes tramadol. Has only had 1 BM in 2-3 weeks. 12 lead unremarkable. Last vitals 148/76, HR 70, 100% RA, CGB 136. 20g in left FA

## 2015-09-29 ENCOUNTER — Emergency Department (HOSPITAL_COMMUNITY): Payer: Medicare Other

## 2015-09-29 DIAGNOSIS — R1031 Right lower quadrant pain: Secondary | ICD-10-CM | POA: Diagnosis not present

## 2015-09-29 LAB — URINALYSIS, ROUTINE W REFLEX MICROSCOPIC
Bilirubin Urine: NEGATIVE
GLUCOSE, UA: NEGATIVE mg/dL
Hgb urine dipstick: NEGATIVE
Ketones, ur: NEGATIVE mg/dL
LEUKOCYTES UA: NEGATIVE
Nitrite: NEGATIVE
PH: 6 (ref 5.0–8.0)
Protein, ur: NEGATIVE mg/dL
Specific Gravity, Urine: 1.014 (ref 1.005–1.030)

## 2015-09-29 LAB — I-STAT CG4 LACTIC ACID, ED: Lactic Acid, Venous: 0.52 mmol/L (ref 0.5–2.0)

## 2015-09-29 MED ORDER — SENNOSIDES-DOCUSATE SODIUM 8.6-50 MG PO TABS: 1.0000 | ORAL_TABLET | Freq: Two times a day (BID) | ORAL | Status: DC | PRN

## 2015-09-29 MED ORDER — IOPAMIDOL (ISOVUE-300) INJECTION 61%
INTRAVENOUS | Status: AC
  Administered 2015-09-29: 100 mL
  Filled 2015-09-29: qty 100

## 2015-09-29 MED ORDER — POLYETHYLENE GLYCOL 3350 17 GM/SCOOP PO POWD: 17.0000 g | Freq: Every day | ORAL | Status: DC

## 2015-09-29 NOTE — ED Provider Notes (Signed)
CT abdomen pelvis is negative for any acute intra-abdominal processes. On reevaluation, he continues to have a soft and benign abdomen. Vital signs are stable. I suspect constipation given diminished bowel movements over the past week. Patient to start MiraLAX and subsequently senna and Colace when necessary. Strict return and follow-up instructions reviewed. He expressed understanding of all discharge instructions and felt comfortable with the plan of care.   Forde Dandy, MD 09/29/15 [phone]

## 2015-09-29 NOTE — ED Provider Notes (Signed)
CSN: [id number]     Arrival date & time 09/28/15  2143 History   First MD Initiated Contact with Patient 09/28/15 2206     Chief Complaint  Patient presents with  . Abdominal Pain     (Consider location/radiation/quality/duration/timing/severity/associated sxs/prior Treatment) HPI Comments: 70 year old male with history of chronic back pain, hypertension, IBS, alcohol abuse presents for lower abdominal pain and near-syncope. The patient reports that he has been having issues with constipation at home. He says that he believes this is part of his issue with his abdominal pain but states that he is having bilateral lower abdominal pain. He ate dinner this evening and after dinner when he went to get up he suddenly felt nauseous and lightheaded and said he went down onto his knees. He felt that his pain in his belly had increased at this time. Denies any change in urinary habits. The patient does take tramadol chronically for his chronic back pain. Denies fevers or chills. The patient did not vomit. He denied chest pain, palpitations, shortness of breath.   Past Medical History  Diagnosis Date  . Hypertension   . Acute asthmatic bronchitis   . Cigarette smoker   . Coronary artery disease   . IBS (irritable bowel syndrome)   . Benign prostatic hypertrophy   . DJD (degenerative joint disease)   . Neck pain   . Back pain   . Psychiatric disorder   . Alcohol abuse    Past Surgical History  Procedure Laterality Date  . Cystoscopy  1999    Dr. Janice Norrie   No family history on file. Social History  Substance Use Topics  . Smoking status: Current Every Day Smoker -- 0.30 packs/day for 40 years    Types: Cigarettes  . Smokeless tobacco: Never Used     Comment: has cut back alot per pt  . Alcohol Use: Yes    Review of Systems  Constitutional: Negative for fever, chills and fatigue.  HENT: Negative for congestion, postnasal drip and rhinorrhea.   Eyes: Negative for visual disturbance.   Respiratory: Negative for cough, chest tightness and shortness of breath.   Cardiovascular: Negative for chest pain, palpitations and leg swelling.  Gastrointestinal: Positive for nausea, abdominal pain and constipation. Negative for vomiting and diarrhea.  Genitourinary: Negative for dysuria, urgency, hematuria and flank pain.  Musculoskeletal: Negative for myalgias and back pain.  Skin: Negative for rash.  Neurological: Positive for light-headedness. Negative for dizziness, seizures, speech difficulty, weakness, numbness and headaches.  Hematological: Does not bruise/bleed easily.      Allergies  Review of patient's allergies indicates no known allergies.  Home Medications   Prior to Admission medications   Medication Sig Start Date End Date Taking? Authorizing Provider  amLODipine (NORVASC) 5 MG tablet Take 1 tablet (5 mg total) by mouth daily. 08/03/15  Yes Dorena Dew, FNP  aspirin EC 81 MG tablet Take 1 tablet (81 mg total) by mouth daily. 08/03/15  Yes Dorena Dew, FNP  fish oil-omega-3 fatty acids 1000 MG capsule Take 1 g by mouth daily.    Yes Historical Provider, MD  ibuprofen (ADVIL,MOTRIN) 200 MG tablet Take 200 mg by mouth every 6 (six) hours as needed for moderate pain.   Yes Historical Provider, MD  Multiple Vitamin (MULTIVITAMIN WITH MINERALS) TABS tablet Take 1 tablet by mouth daily.   Yes Historical Provider, MD  nitroGLYCERIN (NITROSTAT) 0.4 MG SL tablet Place 1 tablet (0.4 mg total) under the tongue every 5 (five) minutes as  needed (If you have possible cardiac chest pain not relieved by 3 NTG, call EMS.). For chest pain 11/14/14  Yes Micheline Chapman, NP  OVER THE COUNTER MEDICATION Place 1 drop into both eyes daily as needed (Eye burning). Reported on 09/11/2015   Yes Historical Provider, MD  polyethylene glycol (MIRALAX / GLYCOLAX) packet Take 17 g by mouth daily. Patient taking differently: Take 17 g by mouth daily as needed.  04/27/15  Yes Tatyana  Kirichenko, PA-C  sildenafil (VIAGRA) 25 MG tablet Take 1 tablet (25 mg total) by mouth daily as needed for erectile dysfunction. 09/11/15  Yes Dorena Dew, FNP  sodium chloride (OCEAN) 0.65 % SOLN nasal spray Place 1 spray into both nostrils as needed for congestion. 06/03/14  Yes Dahlia Bailiff, PA-C  tamsulosin (FLOMAX) 0.4 MG CAPS capsule Take 1 capsule (0.4 mg total) by mouth daily. 08/03/15  Yes Dorena Dew, FNP  tamsulosin (FLOMAX) 0.4 MG CAPS capsule take 1 capsule by mouth once daily 08/10/15  Yes Dorena Dew, FNP  traMADol (ULTRAM) 50 MG tablet Take 1 tablet (50 mg total) by mouth every 6 (six) hours as needed. 09/07/15  Yes Carlisle Cater, PA-C  vitamin C (ASCORBIC ACID) 500 MG tablet Take 500 mg by mouth daily. Reported on 09/11/2015   Yes Historical Provider, MD  acetaminophen (TYLENOL) 325 MG tablet Take 2 tablets (650 mg total) by mouth every 6 (six) hours as needed for moderate pain. Patient not taking: Reported on 09/28/2015 09/16/15   Waynetta Pean, PA-C  cyclobenzaprine (FLEXERIL) 10 MG tablet Take 1 tablet (10 mg total) by mouth 2 (two) times daily as needed for muscle spasms. Patient not taking: Reported on 09/11/2015 09/07/15   Carlisle Cater, PA-C  docusate sodium (COLACE) 100 MG capsule Take 1 capsule (100 mg total) by mouth daily. Patient not taking: Reported on 09/11/2015 08/10/15   Dorena Dew, FNP  lisinopril (PRINIVIL,ZESTRIL) 20 MG tablet Take 1 tablet (20 mg total) by mouth daily. Patient not taking: Reported on 09/11/2015 08/03/15   Dorena Dew, FNP   BP 131/76 mmHg  Pulse 66  Temp(Src) 98.6 F (37 C) (Oral)  Resp 16  SpO2 100% Physical Exam  Constitutional: He is oriented to person, place, and time. He appears well-developed and well-nourished. No distress.  HENT:  Head: Normocephalic and atraumatic.  Right Ear: External ear normal.  Left Ear: External ear normal.  Mouth/Throat: Oropharynx is clear and moist. No oropharyngeal exudate.  Eyes: EOM are normal.  Pupils are equal, round, and reactive to light.  Neck: Normal range of motion. Neck supple.  Cardiovascular: Normal rate, regular rhythm, normal heart sounds and intact distal pulses.   No murmur heard. Pulmonary/Chest: Effort normal. No respiratory distress. He has no wheezes. He has no rales.  Abdominal: Soft. He exhibits no distension. There is tenderness (mild) in the right lower quadrant, suprapubic area and left lower quadrant.  Musculoskeletal: He exhibits no edema.  Neurological: He is alert and oriented to person, place, and time.  Skin: Skin is warm and dry. No rash noted. He is not diaphoretic.  Vitals reviewed.   ED Course  Procedures (including critical care time) Labs Review Labs Reviewed  CBC WITH DIFFERENTIAL/PLATELET - Abnormal; Notable for the following:    Hemoglobin 12.5 (*)    HCT 37.2 (*)    All other components within normal limits  COMPREHENSIVE METABOLIC PANEL - Abnormal; Notable for the following:    Glucose, Bld 106 (*)    ALT  15 (*)    Anion gap 3 (*)    All other components within normal limits  LIPASE, BLOOD  URINALYSIS, ROUTINE W REFLEX MICROSCOPIC (NOT AT [facility])  I-STAT CG4 LACTIC ACID, ED  I-STAT TROPOININ, ED    Imaging Review Dg Chest 2 View  09/28/2015  CLINICAL DATA:  Acute onset of bilateral lower quadrant abdominal pain, tenderness and distention. Nausea and dizziness. Lightheadedness. Initial encounter. EXAM: CHEST  2 VIEW COMPARISON:  Chest radiograph performed 03/10/2015 FINDINGS: The lungs are hyperexpanded and appear grossly clear. There is no evidence of focal opacification, pleural effusion or pneumothorax. The heart is normal in size; the mediastinal contour is within normal limits. No acute osseous abnormalities are seen. IMPRESSION: Lungs hyperexpanded and grossly clear. Electronically Signed   By: Garald Balding M.D.   On: 09/28/2015 23:07   I have personally reviewed and evaluated these images and lab results as part of my medical  decision-making.   EKG Interpretation   Date/Time:  Thursday Sep 28 2015 21:45:55 EDT Ventricular Rate:  73 PR Interval:  160 QRS Duration: 94 QT Interval:  406 QTC Calculation: 447 R Axis:   59 Text Interpretation:  Sinus rhythm Anteroseptal infarct, age indeterminate  No significant change since last tracing Confirmed by Lonia Skinner  [phone]) on 09/28/2015 10:44:06 PM      MDM  Patient seen and evaluated in stable condition. Normal neurologic examination. Mild abdominal tenderness on exam. EKG unremarkable compared to previous. Laboratory studies with a hemoglobin of 12.5 and otherwise unremarkable.  If CT negative for acute process patient likely will be discharged home. He can be instructed to use MiraLAX if this is the case. Final diagnoses:  None    1. Lower abdominal pain to near syncope    Harvel Quale, MD 09/29/15 0040

## 2015-09-29 NOTE — Discharge Instructions (Signed)
The CT scan of your abdomen does not show serious cause of your pain. We suspect that this may be due to underlying constipation. Please start taking her MiraLAX once daily, and you may add on senna/Colace if you do not have regular bowel movements on MiraLAX after the first few days.  please return without fail for worsening symptoms, including fever, vomiting unable to keep down food or fluids, worsening pain, or any other symptoms concerning to.   Abdominal Pain, Adult Many things can cause belly (abdominal) pain. Most times, the belly pain is not dangerous. Many cases of belly pain can be watched and treated at home. HOME CARE   Do not take medicines that help you go poop (laxatives) unless told to by your doctor.  Only take medicine as told by your doctor.  Eat or drink as told by your doctor. Your doctor will tell you if you should be on a special diet. GET HELP IF:  You do not know what is causing your belly pain.  You have belly pain while you are sick to your stomach (nauseous) or have runny poop (diarrhea).  You have pain while you pee or poop.  Your belly pain wakes you up at night.  You have belly pain that gets worse or better when you eat.  You have belly pain that gets worse when you eat fatty foods.  You have a fever. GET HELP RIGHT AWAY IF:   The pain does not go away within 2 hours.  You keep throwing up (vomiting).  The pain changes and is only in the right or left part of the belly.  You have bloody or tarry looking poop. MAKE SURE YOU:   Understand these instructions.  Will watch your condition.  Will get help right away if you are not doing well or get worse.   This information is not intended to replace advice given to you by your health care provider. Make sure you discuss any questions you have with your health care provider.   Document Released: 10/09/2007 Document Revised: 05/13/2014 Document Reviewed: 12/30/2012 Elsevier Interactive Patient  Education [employer].

## 2015-10-08 ENCOUNTER — Emergency Department (HOSPITAL_COMMUNITY)
Admission: EM | Admit: 2015-10-08 | Discharge: 2015-10-08 | Disposition: A | Discharge status: Home / Self Care | Payer: Medicare Other | Attending: Emergency Medicine | Admitting: Emergency Medicine

## 2015-10-08 ENCOUNTER — Encounter (HOSPITAL_COMMUNITY): Payer: Self-pay

## 2015-10-08 ENCOUNTER — Emergency Department (HOSPITAL_COMMUNITY): Payer: Medicare Other

## 2015-10-08 DIAGNOSIS — Z79899 Other long term (current) drug therapy: Secondary | ICD-10-CM | POA: Insufficient documentation

## 2015-10-08 DIAGNOSIS — F17210 Nicotine dependence, cigarettes, uncomplicated: Secondary | ICD-10-CM | POA: Diagnosis not present

## 2015-10-08 DIAGNOSIS — I2510 Atherosclerotic heart disease of native coronary artery without angina pectoris: Secondary | ICD-10-CM | POA: Diagnosis not present

## 2015-10-08 DIAGNOSIS — Z791 Long term (current) use of non-steroidal anti-inflammatories (NSAID): Secondary | ICD-10-CM | POA: Diagnosis not present

## 2015-10-08 DIAGNOSIS — I10 Essential (primary) hypertension: Secondary | ICD-10-CM | POA: Diagnosis not present

## 2015-10-08 DIAGNOSIS — R079 Chest pain, unspecified: Secondary | ICD-10-CM | POA: Diagnosis not present

## 2015-10-08 DIAGNOSIS — Z7982 Long term (current) use of aspirin: Secondary | ICD-10-CM | POA: Insufficient documentation

## 2015-10-08 LAB — COMPREHENSIVE METABOLIC PANEL
ALT: 12 U/L — ABNORMAL LOW (ref 17–63)
ANION GAP: 5 (ref 5–15)
AST: 16 U/L (ref 15–41)
Albumin: 3.8 g/dL (ref 3.5–5.0)
Alkaline Phosphatase: 65 U/L (ref 38–126)
BUN: 12 mg/dL (ref 6–20)
CALCIUM: 9.6 mg/dL (ref 8.9–10.3)
CO2: 29 mmol/L (ref 22–32)
Chloride: 105 mmol/L (ref 101–111)
Creatinine, Ser: 1.16 mg/dL (ref 0.61–1.24)
Glucose, Bld: 97 mg/dL (ref 65–99)
Potassium: 4.1 mmol/L (ref 3.5–5.1)
SODIUM: 139 mmol/L (ref 135–145)
TOTAL PROTEIN: 6.7 g/dL (ref 6.5–8.1)
Total Bilirubin: 0.4 mg/dL (ref 0.3–1.2)

## 2015-10-08 LAB — CBC
HCT: 37.9 % — ABNORMAL LOW (ref 39.0–52.0)
HEMOGLOBIN: 12.8 g/dL — AB (ref 13.0–17.0)
MCH: 29.9 pg (ref 26.0–34.0)
MCHC: 33.8 g/dL (ref 30.0–36.0)
MCV: 88.6 fL (ref 78.0–100.0)
Platelets: 207 10*3/uL (ref 150–400)
RBC: 4.28 MIL/uL (ref 4.22–5.81)
RDW: 13.7 % (ref 11.5–15.5)
WBC: 4.5 10*3/uL (ref 4.0–10.5)

## 2015-10-08 LAB — TROPONIN I: Troponin I: <0.03 ng/mL (ref <0.031)

## 2015-10-08 LAB — I-STAT TROPONIN, ED: TROPONIN I, POC: 0 ng/mL (ref 0.00–0.08)

## 2015-10-08 LAB — BRAIN NATRIURETIC PEPTIDE: B NATRIURETIC PEPTIDE 5: 4.6 pg/mL (ref 0.0–100.0)

## 2015-10-08 LAB — LIPASE, BLOOD: Lipase: 31 U/L (ref 11–51)

## 2015-10-08 LAB — APTT: aPTT: 24 seconds (ref 24–37)

## 2015-10-08 MED ORDER — OMEPRAZOLE 20 MG PO CPDR: 20.0000 mg | DELAYED_RELEASE_CAPSULE | Freq: Every day | ORAL | Status: DC

## 2015-10-08 MED ORDER — GI COCKTAIL ~~LOC~~
30.0000 mL | Freq: Once | ORAL | Status: AC
  Administered 2015-10-08: 30 mL via ORAL
  Filled 2015-10-08: qty 30

## 2015-10-08 NOTE — ED Notes (Signed)
Pt returns from xray, denies c/o pain or discomfort

## 2015-10-08 NOTE — ED Notes (Signed)
Pt states he understands instructions. Home with steady gait.

## 2015-10-08 NOTE — ED Notes (Signed)
Pt arrives EMS with c/o Chest pain which started last night with [facility] and diaphoresis. Pt went to bed but chest pain continues on rising today but no SHOB or diaphoresis. Pain at left chest through to back. Moves easily to bed.

## 2015-10-08 NOTE — Discharge Instructions (Signed)
Nonspecific Chest Pain  Chest pain can be caused by many different conditions. There is always a chance that your pain could be related to something serious, such as a heart attack or a blood clot in your lungs. Chest pain can also be caused by conditions that are not life-threatening. If you have chest pain, it is very important to follow up with your health care provider. CAUSES  Chest pain can be caused by:  Heartburn.  Pneumonia or bronchitis.  Anxiety or stress.  Inflammation around your heart (pericarditis) or lung (pleuritis or pleurisy).  A blood clot in your lung.  A collapsed lung (pneumothorax). It can develop suddenly on its own (spontaneous pneumothorax) or from trauma to the chest.  Shingles infection (varicella-zoster virus).  Heart attack.  Damage to the bones, muscles, and cartilage that make up your chest wall. This can include:  Bruised bones due to injury.  Strained muscles or cartilage due to frequent or repeated coughing or overwork.  Fracture to one or more ribs.  Sore cartilage due to inflammation (costochondritis). RISK FACTORS  Risk factors for chest pain may include:  Activities that increase your risk for trauma or injury to your chest.  Respiratory infections or conditions that cause frequent coughing.  Medical conditions or overeating that can cause heartburn.  Heart disease or family history of heart disease.  Conditions or health behaviors that increase your risk of developing a blood clot.  Having had chicken pox (varicella zoster). SIGNS AND SYMPTOMS Chest pain can feel like:  Burning or tingling on the surface of your chest or deep in your chest.  Crushing, pressure, aching, or squeezing pain.  Dull or sharp pain that is worse when you move, cough, or take a deep breath.  Pain that is also felt in your back, neck, shoulder, or arm, or pain that spreads to any of these areas. Your chest pain may come and go, or it may stay  constant. DIAGNOSIS Lab tests or other studies may be needed to find the cause of your pain. Your health care provider may have you take a test called an ambulatory ECG (electrocardiogram). An ECG records your heartbeat patterns at the time the test is performed. You may also have other tests, such as:  Transthoracic echocardiogram (TTE). During echocardiography, sound waves are used to create a picture of all of the heart structures and to look at how blood flows through your heart.  Transesophageal echocardiogram (TEE).This is a more advanced imaging test that obtains images from inside your body. It allows your health care provider to see your heart in finer detail.  Cardiac monitoring. This allows your health care provider to monitor your heart rate and rhythm in real time.  Holter monitor. This is a portable device that records your heartbeat and can help to diagnose abnormal heartbeats. It allows your health care provider to track your heart activity for several days, if needed.  Stress tests. These can be done through exercise or by taking medicine that makes your heart beat more quickly.  Blood tests.  Imaging tests. TREATMENT  Your treatment depends on what is causing your chest pain. Treatment may include:  Medicines. These may include:  Acid blockers for heartburn.  Anti-inflammatory medicine.  Pain medicine for inflammatory conditions.  Antibiotic medicine, if an infection is present.  Medicines to dissolve blood clots.  Medicines to treat coronary artery disease.  Supportive care for conditions that do not require medicines. This may include:  Resting.  Applying heat  or cold packs to injured areas.  Limiting activities until pain decreases. HOME CARE INSTRUCTIONS  If you were prescribed an antibiotic medicine, finish it all even if you start to feel better.  Avoid any activities that bring on chest pain.  Do not use any tobacco products, including  cigarettes, chewing tobacco, or electronic cigarettes. If you need help quitting, ask your health care provider.  Do not drink alcohol.  Take medicines only as directed by your health care provider.  Keep all follow-up visits as directed by your health care provider. This is important. This includes any further testing if your chest pain does not go away.  If heartburn is the cause for your chest pain, you may be told to keep your head raised (elevated) while sleeping. This reduces the chance that acid will go from your stomach into your esophagus.  Make lifestyle changes as directed by your health care provider. These may include:  Getting regular exercise. Ask your health care provider to suggest some activities that are safe for you.  Eating a heart-healthy diet. A registered dietitian can help you to learn healthy eating options.  Maintaining a healthy weight.  Managing diabetes, if necessary.  Reducing stress. SEEK MEDICAL CARE IF:  Your chest pain does not go away after treatment.  You have a rash with blisters on your chest.  You have a fever. SEEK IMMEDIATE MEDICAL CARE IF:   Your chest pain is worse.  You have an increasing cough, or you cough up blood.  You have severe abdominal pain.  You have severe weakness.  You faint.  You have chills.  You have sudden, unexplained chest discomfort.  You have sudden, unexplained discomfort in your arms, back, neck, or jaw.  You have shortness of breath at any time.  You suddenly start to sweat, or your skin gets clammy.  You feel nauseous or you vomit.  You suddenly feel light-headed or dizzy.  Your heart begins to beat quickly, or it feels like it is skipping beats. These symptoms may represent a serious problem that is an emergency. Do not wait to see if the symptoms will go away. Get medical help right away. Call your local emergency services (911 in the [place] Do not drive yourself to the hospital.   This  information is not intended to replace advice given to you by your health care provider. Make sure you discuss any questions you have with your health care provider.   Document Released: 01/30/2005 Document Revised: 05/13/2014 Document Reviewed: 11/26/2013 Elsevier Interactive Patient Education 2016 [place]. Possible Gastroesophageal Reflux Disease, Adult Normally, food travels down the esophagus and stays in the stomach to be digested. However, when a person has gastroesophageal reflux disease (GERD), food and stomach acid move back up into the esophagus. When this happens, the esophagus becomes sore and inflamed. Over time, GERD can create small holes (ulcers) in the lining of the esophagus.  CAUSES This condition is caused by a problem with the muscle between the esophagus and the stomach (lower esophageal sphincter, or LES). Normally, the LES muscle closes after food passes through the esophagus to the stomach. When the LES is weakened or abnormal, it does not close properly, and that allows food and stomach acid to go back up into the esophagus. The LES can be weakened by certain dietary substances, medicines, and medical conditions, including:  Tobacco use.  Pregnancy.  Having a hiatal hernia.  Heavy alcohol use.  Certain foods and beverages, such as coffee,  chocolate, onions, and peppermint. °RISK FACTORS °This condition is more likely to develop in: °· People who have an increased body weight. °· People who have connective tissue disorders. °· People who use NSAID medicines. °SYMPTOMS °Symptoms of this condition include: °· Heartburn. °· Difficult or painful swallowing. °· The feeling of having a lump in the throat. °· A bitter taste in the mouth. °· Bad breath. °· Having a large amount of saliva. °· Having an upset or bloated stomach. °· Belching. °· Chest pain. °· Shortness of breath or wheezing. °· Ongoing (chronic) cough or a night-time cough. °· Wearing away of tooth  enamel. °· Weight loss. °Different conditions can cause chest pain. Make sure to see your health care provider if you experience chest pain. °DIAGNOSIS °Your health care provider will take a medical history and perform a physical exam. To determine if you have mild or severe GERD, your health care provider may also monitor how you respond to treatment. You may also have other tests, including: °· An endoscopy to examine your stomach and esophagus with a small camera. °· A test that measures the acidity level in your esophagus. °· A test that measures how much pressure is on your esophagus. °· A barium swallow or modified barium swallow to show the shape, size, and functioning of your esophagus. °TREATMENT °The goal of treatment is to help relieve your symptoms and to prevent complications. Treatment for this condition may vary depending on how severe your symptoms are. Your health care provider may recommend: °· Changes to your diet. °· Medicine. °· Surgery. °HOME CARE INSTRUCTIONS °Diet °· Follow a diet as recommended by your health care provider. This may involve avoiding foods and drinks such as: °¨ Coffee and tea (with or without caffeine). °¨ Drinks that contain alcohol. °¨ Energy drinks and sports drinks. °¨ Carbonated drinks or sodas. °¨ Chocolate and cocoa. °¨ Peppermint and mint flavorings. °¨ Garlic and onions. °¨ Horseradish. °¨ Spicy and acidic foods, including peppers, chili powder, curry powder, vinegar, hot sauces, and barbecue sauce. °¨ Citrus fruit juices and citrus fruits, such as oranges, lemons, and limes. °¨ Tomato-based foods, such as red sauce, chili, salsa, and pizza with red sauce. °¨ Fried and fatty foods, such as donuts, french fries, potato chips, and high-fat dressings. °¨ High-fat meats, such as hot dogs and fatty cuts of red and white meats, such as rib eye steak, sausage, ham, and bacon. °¨ High-fat dairy items, such as whole milk, butter, and cream cheese. °· Eat small, frequent  meals instead of large meals. °· Avoid drinking large amounts of liquid with your meals. °· Avoid eating meals during the 2-3 hours before bedtime. °· Avoid lying down right after you eat. °· Do not exercise right after you eat. ° General Instructions  °· Pay attention to any changes in your symptoms. °· Take over-the-counter and prescription medicines only as told by your health care provider. Do not take aspirin, ibuprofen, or other NSAIDs unless your health care provider told you to do so. °· Do not use any tobacco products, including cigarettes, chewing tobacco, and e-cigarettes. If you need help quitting, ask your health care provider. °· Wear loose-fitting clothing. Do not wear anything tight around your waist that causes pressure on your abdomen. °· Raise (elevate) the head of your bed 6 inches (15cm). °· Try to reduce your stress, such as with yoga or meditation. If you need help reducing stress, ask your health care provider. °· If you are overweight, reduce your weight to an amount that is   healthy for you. Ask your health care provider for guidance about a safe weight loss goal. °· Keep all follow-up visits as told by your health care provider. This is important. °SEEK MEDICAL CARE IF: °· You have new symptoms. °· You have unexplained weight loss. °· You have difficulty swallowing, or it hurts to swallow. °· You have wheezing or a persistent cough. °· Your symptoms do not improve with treatment. °· You have a hoarse voice. °SEEK IMMEDIATE MEDICAL CARE IF: °· You have pain in your arms, neck, jaw, teeth, or back. °· You feel sweaty, dizzy, or light-headed. °· You have chest pain or shortness of breath. °· You vomit and your vomit looks like blood or coffee grounds. °· You faint. °· Your stool is bloody or black. °· You cannot swallow, drink, or eat. °  °This information is not intended to replace advice given to you by your health care provider. Make sure you discuss any questions you have with your health  care provider. °  °Document Released: 01/30/2005 Document Revised: 01/11/2015 Document Reviewed: 08/17/2014 °Elsevier Interactive Patient Education ©2016 [employer] ° °

## 2015-10-08 NOTE — ED Provider Notes (Signed)
CSN: [id number]     Arrival date & time 10/08/15  8325 History   First MD Initiated Contact with Patient 10/08/15 0825     Chief Complaint  Patient presents with  . Chest Pain     (Consider location/radiation/quality/duration/timing/severity/associated sxs/prior Treatment) HPI Patient reports he developed chest pain yesterday evening. It was around 7 or 8 at night. He describes it as some tightness but then other components that were sharp and moving around didn't radiating to his back sometimes. He reports he did feel short of breath as well. No nausea or vomiting. He reports he did take aspirin and some nitroglycerin. He felt some improvement. He went to bed but then the pain was still there this morning. When it was still there he became concerned. No recent fever or cough. No lower extremity swelling. Patient does report he has been under additional stress due to his 70 year old daughter living with he and his wife. Apparently she is not caring for HER-2 children and not working. He reports this is caused him a lot of additional stress. He also reports she's had weight loss over about the past month or 2. He reports he's been increasing his oral intake by taking boost shakes. He reports however his appetite has been diminished which he attributes to stress and he usually eats  about 1 meal a day. Past Medical History  Diagnosis Date  . Hypertension   . Acute asthmatic bronchitis   . Cigarette smoker   . Coronary artery disease   . IBS (irritable bowel syndrome)   . Benign prostatic hypertrophy   . DJD (degenerative joint disease)   . Neck pain   . Back pain   . Psychiatric disorder   . Alcohol abuse    Past Surgical History  Procedure Laterality Date  . Cystoscopy  1999    Dr. Janice Norrie  . Hand surgery     History reviewed. No pertinent family history. Social History  Substance Use Topics  . Smoking status: Current Every Day Smoker -- 0.30 packs/day for 40 years    Types: Cigarettes   . Smokeless tobacco: Never Used     Comment: has cut back alot per pt  . Alcohol Use: Yes    Review of Systems 10 Systems reviewed and are negative for acute change except as noted in the HPI.    Allergies  Review of patient's allergies indicates no known allergies.  Home Medications   Prior to Admission medications   Medication Sig Start Date End Date Taking? Authorizing Provider  acetaminophen (TYLENOL) 325 MG tablet Take 2 tablets (650 mg total) by mouth every 6 (six) hours as needed for moderate pain. 09/16/15  Yes Waynetta Pean, PA-C  amLODipine (NORVASC) 5 MG tablet Take 1 tablet (5 mg total) by mouth daily. 08/03/15  Yes Dorena Dew, FNP  aspirin EC 81 MG tablet Take 1 tablet (81 mg total) by mouth daily. 08/03/15  Yes Dorena Dew, FNP  docusate sodium (COLACE) 100 MG capsule Take 1 capsule (100 mg total) by mouth daily. 08/10/15  Yes Dorena Dew, FNP  fish oil-omega-3 fatty acids 1000 MG capsule Take 1 g by mouth daily.    Yes Historical Provider, MD  ibuprofen (ADVIL,MOTRIN) 200 MG tablet Take 200 mg by mouth every 6 (six) hours as needed for moderate pain.   Yes Historical Provider, MD  Multiple Vitamin (MULTIVITAMIN WITH MINERALS) TABS tablet Take 1 tablet by mouth daily.   Yes Historical Provider, MD  OVER  THE COUNTER MEDICATION Place 1 drop into both eyes daily as needed (Eye burning). Reported on 09/11/2015   Yes Historical Provider, MD  polyethylene glycol (MIRALAX / GLYCOLAX) packet Take 17 g by mouth daily. Patient taking differently: Take 17 g by mouth daily as needed.  04/27/15  Yes Tatyana Kirichenko, PA-C  senna-docusate (SENOKOT-S) 8.6-50 MG tablet Take 1 tablet by mouth 2 (two) times daily as needed for mild constipation or moderate constipation. 09/29/15  Yes Forde Dandy, MD  sildenafil (VIAGRA) 25 MG tablet Take 1 tablet (25 mg total) by mouth daily as needed for erectile dysfunction. 09/11/15  Yes Dorena Dew, FNP  sodium chloride (OCEAN) 0.65 %  SOLN nasal spray Place 1 spray into both nostrils as needed for congestion. 06/03/14  Yes Dahlia Bailiff, PA-C  tamsulosin (FLOMAX) 0.4 MG CAPS capsule Take 1 capsule (0.4 mg total) by mouth daily. 08/03/15  Yes Dorena Dew, FNP  traMADol (ULTRAM) 50 MG tablet Take 1 tablet (50 mg total) by mouth every 6 (six) hours as needed. 09/07/15  Yes Carlisle Cater, PA-C  vitamin C (ASCORBIC ACID) 500 MG tablet Take 500 mg by mouth daily. Reported on 09/11/2015   Yes Historical Provider, MD  nitroGLYCERIN (NITROSTAT) 0.4 MG SL tablet Place 1 tablet (0.4 mg total) under the tongue every 5 (five) minutes as needed (If you have possible cardiac chest pain not relieved by 3 NTG, call EMS.). For chest pain 11/14/14   Micheline Chapman, NP  omeprazole (PRILOSEC) 20 MG capsule Take 1 capsule (20 mg total) by mouth daily. 10/08/15   Charlesetta Shanks, MD  polyethylene glycol powder (GLYCOLAX/MIRALAX) powder Take 17 g by mouth daily. Dissolve one capful of powder into liquid once daily for constipation. 09/29/15   Forde Dandy, MD  tamsulosin (FLOMAX) 0.4 MG CAPS capsule take 1 capsule by mouth once daily 08/10/15   Dorena Dew, FNP   BP 118/64 mmHg  Pulse 58  Temp(Src) 98 F (36.7 C) (Oral)  Resp 13  Ht '6\' 2"'  (1.88 m)  Wt 163 lb (73.936 kg)  BMI 20.92 kg/m2  SpO2 98% Physical Exam  Constitutional: He is oriented to person, place, and time. He appears well-developed and well-nourished.  Patient is thin but well-developed. He is nontoxic alert. General appearance is good. Nor spray distress.  HENT:  Head: Normocephalic and atraumatic.  Eyes: EOM are normal. Pupils are equal, round, and reactive to light.  Neck: Neck supple.  Cardiovascular: Normal rate, regular rhythm, normal heart sounds and intact distal pulses.   Pulmonary/Chest: Effort normal and breath sounds normal.  Abdominal: Soft. Bowel sounds are normal. He exhibits no distension. There is no tenderness.  Musculoskeletal: Normal range of motion. He exhibits  no edema or tenderness.  Neurological: He is alert and oriented to person, place, and time. He has normal strength. No cranial nerve deficit. He exhibits normal muscle tone. Coordination normal. GCS eye subscore is 4. GCS verbal subscore is 5. GCS motor subscore is 6.  Skin: Skin is warm, dry and intact.  Psychiatric: He has a normal mood and affect.    ED Course  Procedures (including critical care time) Labs Review Labs Reviewed  CBC - Abnormal; Notable for the following:    Hemoglobin 12.8 (*)    HCT 37.9 (*)    All other components within normal limits  COMPREHENSIVE METABOLIC PANEL - Abnormal; Notable for the following:    ALT 12 (*)    All other components within normal limits  APTT  BRAIN NATRIURETIC PEPTIDE  LIPASE, BLOOD  TROPONIN I  Randolm Idol, ED    Imaging Review Dg Chest 2 View  10/08/2015  CLINICAL DATA:  Chest pain, chest tightness on the left side today. EXAM: CHEST  2 VIEW COMPARISON:  09/28/2015 FINDINGS: Scarring at the lung bases. No confluent airspace opacities or effusions. Heart is normal size. No acute bony abnormality. IMPRESSION: Bibasilar scarring.  No active disease. Electronically Signed   By: Rolm Baptise M.D.   On: 10/08/2015 09:43   I have personally reviewed and evaluated these images and lab results as part of my medical decision-making.   EKG Interpretation   Date/Time:  Sunday October 08 2015 08:26:56 EDT Ventricular Rate:  58 PR Interval:  169 QRS Duration: 91 QT Interval:  406 QTC Calculation: 399 R Axis:   61 Text Interpretation:  Sinus rhythm Anteroseptal infarct, age indeterminate  no STEMI. no change from old. Confirmed by Johnney Killian, MD, Jeannie Done [phone]) on  10/08/2015 8:48:18 AM      MDM   Final diagnoses:  Chest pain, unspecified chest pain type   Patient presents with chest pain starting yesterday evening. It has been migratory in nature. Pain has completely resolved since the patient has been in the emergency department. At  this time he has ruled out for MI. EKG does not show any acute changes. Patient had a CT scan of the abdomen done within the last 2 weeks which did not have acute abnormality. At this time, I feel he is safe to follow-up with his family doctor for additional evaluation. His vital signs are stable and he does not show signs of emaciation or failure to thrive. He has eaten a meal while in the emergency department.    Charlesetta Shanks, MD 10/08/15 [phone]

## 2015-10-08 NOTE — ED Notes (Signed)
Patient transported to X-ray 

## 2015-10-10 ENCOUNTER — Telehealth: Payer: Self-pay

## 2015-10-10 DIAGNOSIS — I10 Essential (primary) hypertension: Secondary | ICD-10-CM

## 2015-10-10 DIAGNOSIS — N400 Enlarged prostate without lower urinary tract symptoms: Secondary | ICD-10-CM

## 2015-10-10 MED ORDER — AMLODIPINE BESYLATE 5 MG PO TABS: 5.0000 mg | ORAL_TABLET | Freq: Every day | ORAL | Status: DC

## 2015-10-10 MED ORDER — TAMSULOSIN HCL 0.4 MG PO CAPS: 0.4000 mg | ORAL_CAPSULE | Freq: Every day | ORAL | Status: DC

## 2015-10-10 NOTE — Telephone Encounter (Signed)
Pt is requesting a medication refill for his Flomax and Amlodopine. Thanks!

## 2015-10-10 NOTE — Telephone Encounter (Signed)
This has been filled to pharmacy. Thanks!

## 2015-12-12 ENCOUNTER — Ambulatory Visit (INDEPENDENT_AMBULATORY_CARE_PROVIDER_SITE_OTHER): Payer: Medicare Other | Admitting: Family Medicine

## 2015-12-12 ENCOUNTER — Encounter: Payer: Self-pay | Admitting: Family Medicine

## 2015-12-12 ENCOUNTER — Encounter: Payer: Self-pay | Admitting: Gastroenterology

## 2015-12-12 VITALS — BP 135/72 | HR 61 | Temp 97.7°F | Resp 18 | Ht 74.0 in | Wt 170.0 lb

## 2015-12-12 DIAGNOSIS — Z114 Encounter for screening for human immunodeficiency virus [HIV]: Secondary | ICD-10-CM | POA: Diagnosis not present

## 2015-12-12 DIAGNOSIS — N400 Enlarged prostate without lower urinary tract symptoms: Secondary | ICD-10-CM | POA: Diagnosis not present

## 2015-12-12 DIAGNOSIS — Z1211 Encounter for screening for malignant neoplasm of colon: Secondary | ICD-10-CM

## 2015-12-12 DIAGNOSIS — I10 Essential (primary) hypertension: Secondary | ICD-10-CM | POA: Diagnosis not present

## 2015-12-12 LAB — LIPID PANEL
CHOLESTEROL: 159 mg/dL (ref 125–200)
HDL: 74 mg/dL (ref >=40)
LDL CALC: 76 mg/dL (ref <130)
TRIGLYCERIDES: 45 mg/dL (ref <150)
Total CHOL/HDL Ratio: 2.1 Ratio (ref <=5.0)
VLDL: 9 mg/dL (ref <30)

## 2015-12-12 LAB — POCT GLYCOSYLATED HEMOGLOBIN (HGB A1C): Hemoglobin A1C: 5.5

## 2015-12-12 NOTE — Patient Instructions (Signed)
Continue current meds. Try to give up those last occ. cigarettes

## 2015-12-12 NOTE — Progress Notes (Signed)
Steven Lowery, is a 70 y.o. male  BV:[id number]  [id number]  DOB - 1945/09/28  CC:  Chief Complaint  Patient presents with  . Follow-up       HPI: [name] is a 70 y.o. male here for follow-up of chronic conditions. He has a history of hypertension and is on amlodipine 5 mg daily. He has a diagnosis of BPH and is on Flomax. He reports he is urinating w/o difficulty. He was started on Viagra for ED several months ago and reports that is not working. He has a diagnosis of COPD in his chart but he denies any respiratory symptoms and is not on any medications. He reports neck and back pain for which he has been referred. He reports that he is only smoking an occasional cigarette and have only an occasional beer. He has a diagnosis of CAD and probably a distant MI. He does not have to use his NTG. He did go to ED recently with chest pain, which was found to be non-cardiac.   Health Maintenance:  He declines Zostavax. He is due for colon cancer screening  No Known Allergies Past Medical History:  Diagnosis Date  . Acute asthmatic bronchitis   . Alcohol abuse   . Back pain   . Benign prostatic hypertrophy   . Cigarette smoker   . Coronary artery disease   . DJD (degenerative joint disease)   . Hypertension   . IBS (irritable bowel syndrome)   . Neck pain   . Psychiatric disorder    Current Outpatient Prescriptions on File Prior to Visit  Medication Sig Dispense Refill  . acetaminophen (TYLENOL) 325 MG tablet Take 2 tablets (650 mg total) by mouth every 6 (six) hours as needed for moderate pain. 60 tablet 0  . amLODipine (NORVASC) 5 MG tablet Take 1 tablet (5 mg total) by mouth daily. 30 tablet 2  . aspirin EC 81 MG tablet Take 1 tablet (81 mg total) by mouth daily. 90 tablet 3  . docusate sodium (COLACE) 100 MG capsule Take 1 capsule (100 mg total) by mouth daily. 30 capsule 0  . fish oil-omega-3 fatty acids 1000 MG capsule Take 1 g by mouth daily.     [name] ibuprofen  (ADVIL,MOTRIN) 200 MG tablet Take 200 mg by mouth every 6 (six) hours as needed for moderate pain.    . Multiple Vitamin (MULTIVITAMIN WITH MINERALS) TABS tablet Take 1 tablet by mouth daily.    . nitroGLYCERIN (NITROSTAT) 0.4 MG SL tablet Place 1 tablet (0.4 mg total) under the tongue every 5 (five) minutes as needed (If you have possible cardiac chest pain not relieved by 3 NTG, call EMS.). For chest pain 30 tablet 1  . omeprazole (PRILOSEC) 20 MG capsule Take 1 capsule (20 mg total) by mouth daily. 30 capsule 0  . OVER THE COUNTER MEDICATION Place 1 drop into both eyes daily as needed (Eye burning). Reported on 09/11/2015    . polyethylene glycol (MIRALAX / GLYCOLAX) packet Take 17 g by mouth daily. (Patient taking differently: Take 17 g by mouth daily as needed. ) 14 each 0  . polyethylene glycol powder (GLYCOLAX/MIRALAX) powder Take 17 g by mouth daily. Dissolve one capful of powder into liquid once daily for constipation. 500 g 0  . senna-docusate (SENOKOT-S) 8.6-50 MG tablet Take 1 tablet by mouth 2 (two) times daily as needed for mild constipation or moderate constipation. 60 tablet 0  . sildenafil (VIAGRA) 25 MG tablet Take 1 tablet (25  mg total) by mouth daily as needed for erectile dysfunction. 20 tablet 1  . sodium chloride (OCEAN) 0.65 % SOLN nasal spray Place 1 spray into both nostrils as needed for congestion. 30 mL 0  . tamsulosin (FLOMAX) 0.4 MG CAPS capsule take 1 capsule by mouth once daily 30 capsule 3  . tamsulosin (FLOMAX) 0.4 MG CAPS capsule Take 1 capsule (0.4 mg total) by mouth daily. 30 capsule 3  . traMADol (ULTRAM) 50 MG tablet Take 1 tablet (50 mg total) by mouth every 6 (six) hours as needed. 21 tablet 0  . vitamin C (ASCORBIC ACID) 500 MG tablet Take 500 mg by mouth daily. Reported on 09/11/2015     No current facility-administered medications on file prior to visit.    No family history on file. Social History   Social History  . Marital status: Married    Spouse  name: N/A  . Number of children: N/A  . Years of education: N/A   Occupational History  . Not on file.   Social History Main Topics  . Smoking status: Current Every Day Smoker    Packs/day: 0.30    Years: 40.00    Types: Cigarettes  . Smokeless tobacco: Never Used     Comment: has cut back alot per pt  . Alcohol use Yes  . Drug use: No  . Sexual activity: Yes   Other Topics Concern  . Not on file   Social History Narrative  . No narrative on file    Review of Systems: Constitutional: Negative for fever, chills, appetite change, weight loss,  Fatigue. Skin: Negative for rashes or lesions of concern. HENT: Negative for ear pain, ear discharge.nose bleeds Eyes: Negative for pain, discharge, redness, itching and visual disturbance. Neck: Negative for pain, stiffness Respiratory: Negative for cough, shortness of breath,   Cardiovascular: Negative for chest pain, palpitations and leg swelling. Gastrointestinal: Negative for abdominal pain, nausea, vomiting, diarrhea, constipations Genitourinary: Negative for dysuria, urgency, frequency, hematuria,  Musculoskeletal Positive for back and neck pain. Walks with a cane Neurological: Negative for dizziness, tremors, seizures, syncope,   light-headedness, numbness and headaches.  Hematological: Negative for easy bruising or bleeding Psychiatric/Behavioral: Negative for depression, anxiety, decreased concentration, confusion   Objective:   Vitals:   12/12/15 0938  BP: 135/72  Pulse: 61  Resp: 18  Temp: 97.7 F (36.5 C)    Physical Exam: Constitutional: Patient appears well-developed and well-nourished. No distress. HENT: Normocephalic, atraumatic, External right and left ear normal. Oropharynx is clear and moist.  Eyes: Conjunctivae and EOM are normal. PERRLA, no scleral icterus. Neck: Normal ROM. Neck supple. No lymphadenopathy, No thyromegaly. CVS: RRR, S1/S2 +, no murmurs, no gallops, no rubs Pulmonary: Effort and  breath sounds normal, no stridor, rhonchi, wheezes, rales.  Abdominal: Soft. Normoactive BS,, no distension, tenderness, rebound or guarding.  Musculoskeletal: Normal range of motion. No edema and no tenderness.  Neuro: Alert.Normal muscle tone coordination. Non-focal Skin: Skin is warm and dry. No rash noted. Not diaphoretic. No erythema. No pallor. Psychiatric: Normal mood and affect. Behavior, judgment, thought content normal.  Lab Results  Component Value Date   WBC 4.5 10/08/2015   HGB 12.8 (L) 10/08/2015   HCT 37.9 (L) 10/08/2015   MCV 88.6 10/08/2015   PLT 207 10/08/2015   Lab Results  Component Value Date   CREATININE 1.16 10/08/2015   BUN 12 10/08/2015   NA 139 10/08/2015   K 4.1 10/08/2015   CL 105 10/08/2015   CO2 29  10/08/2015    Lab Results  Component Value Date   HGBA1C 5.7 (H) 04/17/2015   Lipid Panel     Component Value Date/Time   CHOL 146 11/14/2014 1121   TRIG 54 11/14/2014 1121   HDL 62 11/14/2014 1121   CHOLHDL 2.4 11/14/2014 1121   VLDL 11 11/14/2014 1121   LDLCALC 73 11/14/2014 1121       Assessment and plan:   1. Essential hypertension -Continue amlodipine 5 daily  2. BPH (benign prostatic hyperplasia) -Continue flomax.   Return in about 6 months (around 06/13/2016).  The patient was given clear instructions to go to ER or return to medical center if symptoms don't improve, worsen or new problems develop. The patient verbalized understanding.    Micheline Chapman FNP  12/12/2015, 10:10 AM

## 2015-12-12 NOTE — Progress Notes (Signed)
Patient is here for FU  Patient denies pain at this time.  Patient has taken medication today.

## 2015-12-13 LAB — HIV ANTIBODY (ROUTINE TESTING W REFLEX): HIV 1&2 Ab, 4th Generation: NONREACTIVE

## 2016-01-02 ENCOUNTER — Encounter (HOSPITAL_COMMUNITY): Payer: Self-pay

## 2016-01-02 ENCOUNTER — Emergency Department (HOSPITAL_COMMUNITY): Payer: Medicare Other

## 2016-01-02 ENCOUNTER — Emergency Department (HOSPITAL_COMMUNITY)
Admission: EM | Admit: 2016-01-02 | Discharge: 2016-01-02 | Disposition: A | Discharge status: Home / Self Care | Payer: Medicare Other | Attending: Emergency Medicine | Admitting: Emergency Medicine

## 2016-01-02 DIAGNOSIS — I10 Essential (primary) hypertension: Secondary | ICD-10-CM | POA: Diagnosis not present

## 2016-01-02 DIAGNOSIS — I2510 Atherosclerotic heart disease of native coronary artery without angina pectoris: Secondary | ICD-10-CM | POA: Diagnosis not present

## 2016-01-02 DIAGNOSIS — Z7982 Long term (current) use of aspirin: Secondary | ICD-10-CM | POA: Diagnosis not present

## 2016-01-02 DIAGNOSIS — K5900 Constipation, unspecified: Secondary | ICD-10-CM | POA: Insufficient documentation

## 2016-01-02 DIAGNOSIS — F1120 Opioid dependence, uncomplicated: Secondary | ICD-10-CM | POA: Insufficient documentation

## 2016-01-02 DIAGNOSIS — F1190 Opioid use, unspecified, uncomplicated: Secondary | ICD-10-CM

## 2016-01-02 DIAGNOSIS — R1030 Lower abdominal pain, unspecified: Secondary | ICD-10-CM | POA: Diagnosis present

## 2016-01-02 DIAGNOSIS — J449 Chronic obstructive pulmonary disease, unspecified: Secondary | ICD-10-CM | POA: Insufficient documentation

## 2016-01-02 DIAGNOSIS — F17210 Nicotine dependence, cigarettes, uncomplicated: Secondary | ICD-10-CM | POA: Diagnosis not present

## 2016-01-02 DIAGNOSIS — Z79899 Other long term (current) drug therapy: Secondary | ICD-10-CM | POA: Insufficient documentation

## 2016-01-02 LAB — URINALYSIS, ROUTINE W REFLEX MICROSCOPIC
Bilirubin Urine: NEGATIVE
Glucose, UA: NEGATIVE mg/dL
Ketones, ur: NEGATIVE mg/dL
Leukocytes, UA: NEGATIVE
Nitrite: NEGATIVE
Protein, ur: NEGATIVE mg/dL
SPECIFIC GRAVITY, URINE: 1.015 (ref 1.005–1.030)
pH: 6 (ref 5.0–8.0)

## 2016-01-02 LAB — CBC
HEMATOCRIT: 41.7 % (ref 39.0–52.0)
HEMOGLOBIN: 14 g/dL (ref 13.0–17.0)
MCH: 30 pg (ref 26.0–34.0)
MCHC: 33.6 g/dL (ref 30.0–36.0)
MCV: 89.3 fL (ref 78.0–100.0)
Platelets: 196 10*3/uL (ref 150–400)
RBC: 4.67 MIL/uL (ref 4.22–5.81)
RDW: 12.9 % (ref 11.5–15.5)
WBC: 4.1 10*3/uL (ref 4.0–10.5)

## 2016-01-02 LAB — COMPREHENSIVE METABOLIC PANEL
ALK PHOS: 65 U/L (ref 38–126)
ALT: 11 U/L — ABNORMAL LOW (ref 17–63)
ANION GAP: 6 (ref 5–15)
AST: 17 U/L (ref 15–41)
Albumin: 4.1 g/dL (ref 3.5–5.0)
BUN: 7 mg/dL (ref 6–20)
CALCIUM: 9.9 mg/dL (ref 8.9–10.3)
CHLORIDE: 106 mmol/L (ref 101–111)
CO2: 27 mmol/L (ref 22–32)
Creatinine, Ser: 0.98 mg/dL (ref 0.61–1.24)
GFR calc non Af Amer: >60 mL/min (ref >60)
Glucose, Bld: 148 mg/dL — ABNORMAL HIGH (ref 65–99)
POTASSIUM: 3.8 mmol/L (ref 3.5–5.1)
SODIUM: 139 mmol/L (ref 135–145)
Total Bilirubin: 0.7 mg/dL (ref 0.3–1.2)
Total Protein: 7.1 g/dL (ref 6.5–8.1)

## 2016-01-02 LAB — URINE MICROSCOPIC-ADD ON: WBC UA: NONE SEEN WBC/hpf (ref 0–5)

## 2016-01-02 LAB — LIPASE, BLOOD: LIPASE: 29 U/L (ref 11–51)

## 2016-01-02 MED ORDER — POLYETHYLENE GLYCOL 3350 17 GM/SCOOP PO POWD: 17.0000 g | Freq: Two times a day (BID) | ORAL | 0 refills | Status: DC

## 2016-01-02 NOTE — ED Triage Notes (Signed)
Patient arrived by [facility] for llq abdominal pain and left flank pain. States no BM x 1 week. Denies dysuria, no nausea, no vomiting

## 2016-01-02 NOTE — ED Provider Notes (Signed)
[facility] DEPT Provider Note   CSN: [id number] Arrival date & time: 01/02/16  1241     History   Chief Complaint Chief Complaint  Patient presents with  . Flank Pain    HPI [name] is a 70 y.o. male.  The history is provided by the patient and medical records.  Flank Pain    70 y.o. M with hx of asthma, IBS, BPH, DJD, presenting to the ED for left flank pain and left lower abdominal pain.  Patient states this has been ongoing for the past several days.  States pain is localized to the left side of his back and radiates around to his left groin. He denies any nausea, vomiting, or diarrhea. Bases actually been constipated over the past week. He does take tramadol daily for his chronic back pain. He has also been wearing a back brace. He denies any dysuria, hematuria, or difficulty urinating. He has no known history of kidney stones. He denies any fever or chills.  Past Medical History:  Diagnosis Date  . Acute asthmatic bronchitis   . Alcohol abuse   . Back pain   . Benign prostatic hypertrophy   . Cigarette smoker   . Coronary artery disease   . DJD (degenerative joint disease)   . Hypertension   . IBS (irritable bowel syndrome)   . Neck pain   . Psychiatric disorder     Patient Active Problem List   Diagnosis Date Noted  . BPH (benign prostatic hyperplasia) 02/08/2015  . Decreased urine stream 02/08/2015  . 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 PSYCHIATRIC DISORDER 06/28/2008  . CIGARETTE SMOKER 06/28/2008  . Essential hypertension 06/28/2008  . CORONARY ARTERY DISEASE 06/28/2008  . ASTHMATIC BRONCHITIS, ACUTE 06/28/2008  . Osteoarthritis 06/28/2008  . NECK PAIN 06/28/2008  . Tobacco dependence 06/28/2008  . BACK PAIN 12/24/2007  . ALCOHOL ABUSE 05/12/2007  . IRRITABLE BOWEL SYNDROME, HX OF 05/12/2007    Past Surgical History:  Procedure Laterality Date  . CYSTOSCOPY  1999   Dr. Janice Norrie  . HAND SURGERY         Home Medications    Prior  to Admission medications   Medication Sig Start Date End Date Taking? Authorizing Provider  acetaminophen (TYLENOL) 325 MG tablet Take 2 tablets (650 mg total) by mouth every 6 (six) hours as needed for moderate pain. 09/16/15   Waynetta Pean, PA-C  amLODipine (NORVASC) 5 MG tablet Take 1 tablet (5 mg total) by mouth daily. 10/10/15   Dorena Dew, FNP  aspirin EC 81 MG tablet Take 1 tablet (81 mg total) by mouth daily. 08/03/15   Dorena Dew, FNP  docusate sodium (COLACE) 100 MG capsule Take 1 capsule (100 mg total) by mouth daily. 08/10/15   Dorena Dew, FNP  fish oil-omega-3 fatty acids 1000 MG capsule Take 1 g by mouth daily.     Historical Provider, MD  ibuprofen (ADVIL,MOTRIN) 200 MG tablet Take 200 mg by mouth every 6 (six) hours as needed for moderate pain.    Historical Provider, MD  Multiple Vitamin (MULTIVITAMIN WITH MINERALS) TABS tablet Take 1 tablet by mouth daily.    Historical Provider, MD  nitroGLYCERIN (NITROSTAT) 0.4 MG SL tablet Place 1 tablet (0.4 mg total) under the tongue every 5 (five) minutes as needed (If you have possible cardiac chest pain not relieved by 3 NTG, call EMS.). For chest pain 11/14/14   Micheline Chapman, NP  omeprazole (PRILOSEC) 20 MG capsule Take 1  capsule (20 mg total) by mouth daily. 10/08/15   Charlesetta Shanks, MD  OVER THE COUNTER MEDICATION Place 1 drop into both eyes daily as needed (Eye burning). Reported on 09/11/2015    Historical Provider, MD  polyethylene glycol (MIRALAX / GLYCOLAX) packet Take 17 g by mouth daily. Patient taking differently: Take 17 g by mouth daily as needed.  04/27/15   Tatyana Kirichenko, PA-C  polyethylene glycol powder (GLYCOLAX/MIRALAX) powder Take 17 g by mouth daily. Dissolve one capful of powder into liquid once daily for constipation. 09/29/15   Forde Dandy, MD  senna-docusate (SENOKOT-S) 8.6-50 MG tablet Take 1 tablet by mouth 2 (two) times daily as needed for mild constipation or moderate constipation. 09/29/15    Forde Dandy, MD  sildenafil (VIAGRA) 25 MG tablet Take 1 tablet (25 mg total) by mouth daily as needed for erectile dysfunction. 09/11/15   Dorena Dew, FNP  sodium chloride (OCEAN) 0.65 % SOLN nasal spray Place 1 spray into both nostrils as needed for congestion. 06/03/14   Dahlia Bailiff, PA-C  tamsulosin (FLOMAX) 0.4 MG CAPS capsule take 1 capsule by mouth once daily 08/10/15   Dorena Dew, FNP  tamsulosin (FLOMAX) 0.4 MG CAPS capsule Take 1 capsule (0.4 mg total) by mouth daily. 10/10/15   Dorena Dew, FNP  traMADol (ULTRAM) 50 MG tablet Take 1 tablet (50 mg total) by mouth every 6 (six) hours as needed. 09/07/15   Carlisle Cater, PA-C  vitamin C (ASCORBIC ACID) 500 MG tablet Take 500 mg by mouth daily. Reported on 09/11/2015    Historical Provider, MD    Family History No family history on file.  Social History Social History  Substance Use Topics  . Smoking status: Current Every Day Smoker    Packs/day: 0.30    Years: 40.00    Types: Cigarettes  . Smokeless tobacco: Never Used     Comment: has cut back alot per pt  . Alcohol use Yes     Allergies   Review of patient's allergies indicates no known allergies.   Review of Systems Review of Systems  Gastrointestinal: Positive for constipation.  Genitourinary: Positive for flank pain.  All other systems reviewed and are negative.    Physical Exam Updated Vital Signs BP 151/93 (BP Location: Right Arm)   Pulse 64   Temp 97.9 F (36.6 C) (Oral)   Resp 18   SpO2 99%   Physical Exam  Constitutional: He is oriented to person, place, and time. He appears well-developed and well-nourished.  Appears well, no distress, lying comfortably in bed  HENT:  Head: Normocephalic and atraumatic.  Mouth/Throat: Oropharynx is clear and moist.  Eyes: Conjunctivae and EOM are normal. Pupils are equal, round, and reactive to light.  Neck: Normal range of motion.  Cardiovascular: Normal rate, regular rhythm and normal heart sounds.     Pulmonary/Chest: Effort normal and breath sounds normal.  Abdominal: Soft. Bowel sounds are normal.  Patient has Velcro style wrap-around back brace on-- when unfastened abdomen does not appear distended, normal bowel sounds; mild tenderness in the left lower abdomen which he reports radiates to his left back  Musculoskeletal: Normal range of motion.  Neurological: He is alert and oriented to person, place, and time.  Skin: Skin is warm and dry.  Psychiatric: He has a normal mood and affect.  Nursing note and vitals reviewed.    ED Treatments / Results  Labs (all labs ordered are listed, but only abnormal results are displayed)  Labs Reviewed  COMPREHENSIVE METABOLIC PANEL - Abnormal; Notable for the following:       Result Value   Glucose, Bld 148 (*)    ALT 11 (*)    All other components within normal limits  URINALYSIS, ROUTINE W REFLEX MICROSCOPIC (NOT AT [facility]) - Abnormal; Notable for the following:    Hgb urine dipstick TRACE (*)    All other components within normal limits  URINE MICROSCOPIC-ADD ON - Abnormal; Notable for the following:    Squamous Epithelial / LPF 0-5 (*)    Bacteria, UA RARE (*)    All other components within normal limits  LIPASE, BLOOD  CBC    EKG  EKG Interpretation None       Radiology Ct Renal Stone Study  Result Date: 01/02/2016 CLINICAL DATA:  Suprapubic pain and hematuria for a week. History of hypertension, benign prostatic hypertrophy, smoking. EXAM: CT ABDOMEN AND PELVIS WITHOUT CONTRAST TECHNIQUE: Multidetector CT imaging of the abdomen and pelvis was performed following the standard protocol without IV contrast. COMPARISON:  CT abdomen and pelvis Sep 29, 2015 FINDINGS: LUNG BASES: RIGHT lung base atelectasis. Heart size is normal. No pericardial effusion. Eccentric soft tissue within the distal esophagus, stone no mass identified on prior contrast-enhanced CT. KIDNEYS/BLADDER: Kidneys are orthotopic, demonstrating normal size and  morphology. No nephrolithiasis, hydronephrosis; limited assessment for renal masses on this nonenhanced examination. The unopacified ureters are normal in course and caliber. Mild urinary bladder wall thickening upper limits of normal for degree of distension. SOLID ORGANS: The liver, spleen, gallbladder, pancreas and adrenal glands are unremarkable for this non-contrast examination. GASTROINTESTINAL TRACT: The stomach, small and large bowel are normal in course and caliber without inflammatory changes, the sensitivity may be decreased by lack of enteric contrast. Moderate amount of retained large bowel stool. Normal appendix. PERITONEUM/RETROPERITONEUM: Aortoiliac vessels are normal in course and caliber. No lymphadenopathy by CT size criteria. Prostate is mildly enlarged, invading the base the urinary bladder. No intraperitoneal free fluid nor free air. SOFT TISSUES/ OSSEOUS STRUCTURES: Nonsuspicious. Broad levoscoliosis. IMPRESSION: No urolithiasis, obstructive uropathy nor acute intra-abdominal/ pelvic process. Moderate amount of large bowel stool without bowel obstruction. Electronically Signed   By: Elon Alas M.D.   On: 01/02/2016 18:07    Procedures Procedures (including critical care time)  Medications Ordered in ED Medications - No data to display   Initial Impression / Assessment and Plan / ED Course  I have reviewed the triage vital signs and the nursing notes.  Pertinent labs & imaging results that were available during my care of the patient were reviewed by me and considered in my medical decision making (see chart for details).  Clinical Course   70 year old male here with left flank and left lower abdominal pain. Reports no bowel movement over the past week. Denies any current urinary symptoms. No history of kidney stones. He is afebrile and nontoxic. He does have some tenderness of the left lower abdomen which he reports radiates to his left flank. Labwork is overall  reassuring. Trace blood noted in his urine, overall noninfectious. CT renal study obtained, no acute urinary calculus but there is significant constipation noted. Patient states he ran out of his MiraLAX several days ago so he has no been taking this. His constipation is likely due to his chronic opioid use as he takes daily tramadol for his chronic back pain. Will refill his MiraLAX, states he is familiar with dosing. He will follow-up with his primary care doctor.  Discussed plan  with patient, he acknowledged understanding and agreed with plan of care.  Return precautions given for new or worsening symptoms.  Final Clinical Impressions(s) / ED Diagnoses   Final diagnoses:  Constipation, unspecified constipation type  Chronic, continuous use of opioids    New Prescriptions New Prescriptions   POLYETHYLENE GLYCOL POWDER (GLYCOLAX/MIRALAX) POWDER    Take 17 g by mouth 2 (two) times daily. Until daily soft stools  OTC     Larene Pickett, PA-C 01/02/16 1940    Sherwood Gambler, MD 01/03/16 [phone]

## 2016-01-02 NOTE — Discharge Instructions (Signed)
Your CT scan today showed constipation again. Please start using her MiraLAX again daily until your bowel movements regulate.  If you develop any diarrhea, you may cut back on use. Follow-up with your primary care doctor. Return here for any new or worsening symptoms.

## 2016-01-02 NOTE — ED Notes (Signed)
Patient has returned from being out of the department; patient placed back on continuous pulse oximetry and blood pressure cuff 

## 2016-01-02 NOTE — ED Notes (Signed)
PA at bedside.

## 2016-01-10 ENCOUNTER — Other Ambulatory Visit: Payer: Self-pay | Admitting: Hematology

## 2016-01-10 DIAGNOSIS — I10 Essential (primary) hypertension: Secondary | ICD-10-CM

## 2016-01-10 DIAGNOSIS — N400 Enlarged prostate without lower urinary tract symptoms: Secondary | ICD-10-CM

## 2016-01-10 MED ORDER — AMLODIPINE BESYLATE 5 MG PO TABS: 5.0000 mg | ORAL_TABLET | Freq: Every day | ORAL | 2 refills | Status: DC

## 2016-01-10 MED ORDER — TAMSULOSIN HCL 0.4 MG PO CAPS: 0.4000 mg | ORAL_CAPSULE | Freq: Every day | ORAL | 3 refills | Status: DC

## 2016-01-17 ENCOUNTER — Ambulatory Visit (AMBULATORY_SURGERY_CENTER): Payer: Self-pay

## 2016-01-17 VITALS — Ht 74.0 in | Wt 163.6 lb

## 2016-01-17 DIAGNOSIS — Z1211 Encounter for screening for malignant neoplasm of colon: Secondary | ICD-10-CM

## 2016-01-17 MED ORDER — SUPREP BOWEL PREP KIT 17.5-3.13-1.6 GM/177ML PO SOLN: 1.0000 | Freq: Once | ORAL | 0 refills | Status: AC

## 2016-01-17 NOTE — Progress Notes (Signed)
No allergies to eggs or soy No diet meds No home oxygen No past problems with anesthesia  No internet 

## 2016-01-31 ENCOUNTER — Ambulatory Visit (AMBULATORY_SURGERY_CENTER): Payer: Medicare Other | Admitting: Gastroenterology

## 2016-01-31 ENCOUNTER — Encounter: Payer: Self-pay | Admitting: Gastroenterology

## 2016-01-31 VITALS — BP 130/70 | HR 53 | Temp 96.2°F | Resp 14 | Ht 74.0 in | Wt 163.0 lb

## 2016-01-31 DIAGNOSIS — D122 Benign neoplasm of ascending colon: Secondary | ICD-10-CM | POA: Diagnosis not present

## 2016-01-31 DIAGNOSIS — Z1211 Encounter for screening for malignant neoplasm of colon: Secondary | ICD-10-CM | POA: Diagnosis not present

## 2016-01-31 DIAGNOSIS — D125 Benign neoplasm of sigmoid colon: Secondary | ICD-10-CM

## 2016-01-31 MED ORDER — SODIUM CHLORIDE 0.9 % IV SOLN: 500.0000 mL | INTRAVENOUS | Status: AC

## 2016-01-31 NOTE — Patient Instructions (Signed)
YOU HAD AN ENDOSCOPIC PROCEDURE TODAY AT THE [facility]:   Refer to the procedure report that was given to you for any specific questions about what was found during the examination.  If the procedure report does not answer your questions, please call your gastroenterologist to clarify.  If you requested that your care partner not be given the details of your procedure findings, then the procedure report has been included in a sealed envelope for you to review at your convenience later.  YOU SHOULD EXPECT: Some feelings of bloating in the abdomen. Passage of more gas than usual.  Walking can help get rid of the air that was put into your GI tract during the procedure and reduce the bloating. If you had a lower endoscopy (such as a colonoscopy or flexible sigmoidoscopy) you may notice spotting of blood in your stool or on the toilet paper. If you underwent a bowel prep for your procedure, you may not have a normal bowel movement for a few days.  Please Note:  You might notice some irritation and congestion in your nose or some drainage.  This is from the oxygen used during your procedure.  There is no need for concern and it should clear up in a day or so.  SYMPTOMS TO REPORT IMMEDIATELY:   Following lower endoscopy (colonoscopy or flexible sigmoidoscopy):  Excessive amounts of blood in the stool  Significant tenderness or worsening of abdominal pains  Swelling of the abdomen that is new, acute  Fever of 100F or higher    For urgent or emergent issues, a gastroenterologist can be reached at any hour by calling [phone].   DIET:  We do recommend a small meal at first, but then you may proceed to your regular diet.  Drink plenty of fluids but you should avoid alcoholic beverages for 24 hours.  ACTIVITY:  You should plan to take it easy for the rest of today and you should NOT DRIVE or use heavy machinery until tomorrow (because of the sedation medicines used during the test).     FOLLOW UP: Our staff will call the number listed on your records the next business day following your procedure to check on you and address any questions or concerns that you may have regarding the information given to you following your procedure. If we do not reach you, we will leave a message.  However, if you are feeling well and you are not experiencing any problems, there is no need to return our call.  We will assume that you have returned to your regular daily activities without incident.  If any biopsies were taken you will be contacted by phone or by letter within the next 1-3 weeks.  Please call us at [phone] if you have not heard about the biopsies in 3 weeks.    SIGNATURES/CONFIDENTIALITY: You and/or your care partner have signed paperwork which will be entered into your electronic medical record.  These signatures attest to the fact that that the information above on your After Visit Summary has been reviewed and is understood.  Full responsibility of the confidentiality of this discharge information lies with you and/or your care-partner.   Resume medications. Information given on polyps and hemorrhoids. 

## 2016-01-31 NOTE — Progress Notes (Signed)
Medicaid transportation formed faxed to [phone]

## 2016-01-31 NOTE — Progress Notes (Signed)
  [facility] Post-op Note  Patient: Steven Lowery  Procedure(s) Performed: colonoscopy  Patient Location: [facility] - Recovery Area  Anesthesia Type: Deep Sedation/Propofol  Level of Consciousness: awake, oriented and patient cooperative  Airway and Oxygen Therapy: Patient Spontanous Breathing  Post-op Pain: none  Post-op Assessment:  Post-op Vital signs reviewed, Patient's Cardiovascular Status Stable, Respiratory Function Stable, Patent Airway, No signs of Nausea or vomiting and Pain level controlled  Post-op Vital Signs: Reviewed and stable  Complications: No apparent anesthesia complications  November Sypher E 11:52 AM

## 2016-01-31 NOTE — Progress Notes (Signed)
Pt. And family await ride in consultation room.

## 2016-01-31 NOTE — Progress Notes (Signed)
Called to room to assist during endoscopic procedure.  Patient ID and intended procedure confirmed with present staff. Received instructions for my participation in the procedure from the performing physician.  

## 2016-01-31 NOTE — Op Note (Signed)
[facility] Patient Name: Steven Lowery Procedure Date: 01/31/2016 11:24 AM MRN: [id number] Endoscopist: Ladene Artist , MD Age: 70 Referring MD:  Date of Birth: 09-Oct-1945 Gender: Male Account #: [id number] Procedure:                Colonoscopy Indications:              Screening for colorectal malignant neoplasm, This                            is the patient's first colonoscopy Medicines:                Monitored Anesthesia Care Procedure:                Pre-Anesthesia Assessment:                           - Prior to the procedure, a History and Physical                            was performed, and patient medications and                            allergies were reviewed. The patient's tolerance of                            previous anesthesia was also reviewed. The risks                            and benefits of the procedure and the sedation                            options and risks were discussed with the patient.                            All questions were answered, and informed consent                            was obtained. Prior Anticoagulants: The patient has                            taken no previous anticoagulant or antiplatelet                            agents. ASA Grade Assessment: II - A patient with                            mild systemic disease. After reviewing the risks                            and benefits, the patient was deemed in                            satisfactory condition to undergo the procedure.  After obtaining informed consent, the colonoscope                            was passed under direct vision. Throughout the                            procedure, the patient's blood pressure, pulse, and                            oxygen saturations were monitored continuously. The                            Model PCF-[id number]) scope was introduced                            through the anus and  advanced to the the cecum,                            identified by appendiceal orifice and ileocecal                            valve. The ileocecal valve, appendiceal orifice,                            and rectum were photographed. The quality of the                            bowel preparation was excellent. The colonoscopy                            was performed without difficulty. The patient                            tolerated the procedure well. Scope In: 11:28:10 AM Scope Out: 11:47:15 AM Scope Withdrawal Time: 0 hours 14 minutes 12 seconds  Total Procedure Duration: 0 hours 19 minutes 5 seconds  Findings:                 The perianal and digital rectal examinations were                            normal.                           A 8 mm polyp was found in the ascending colon. The                            polyp was pedunculated. The polyp was removed with                            a cold snare. Resection and retrieval were complete.                           Four sessile polyps were found in the sigmoid colon  and ascending colon. The polyps were 5 to 6 mm in                            size. These polyps were removed with a cold snare.                            Resection and retrieval were complete.                           Internal hemorrhoids were found during                            retroflexion. The hemorrhoids were small and Grade                            I (internal hemorrhoids that do not prolapse).                           The exam was otherwise without abnormality on                            direct and retroflexion views. Complications:            No immediate complications. Estimated blood loss:                            None. Estimated Blood Loss:     Estimated blood loss: none. Impression:               - One 8 mm polyp in the ascending colon, removed                            with a cold snare. Resected and retrieved.                            - Four 5 to 6 mm polyps in the sigmoid colon and in                            the ascending colon, removed with a cold snare.                            Resected and retrieved.                           - Internal hemorrhoids.                           - The examination was otherwise normal on direct                            and retroflexion views. Recommendation:           - Repeat colonoscopy in 5 years for surveillance if  polyp(s) are precancreous, otherwise no plans for                            future screening colonoscopies due to age                           - Patient has a contact number available for                            emergencies. The signs and symptoms of potential                            delayed complications were discussed with the                            patient. Return to normal activities tomorrow.                            Written discharge instructions were provided to the                            patient.                           - Resume previous diet.                           - Continue present medications.                           - Await pathology results. Ladene Artist, MD 01/31/2016 11:52:33 AM This report has been signed electronically.

## 2016-02-01 ENCOUNTER — Telehealth: Payer: Self-pay | Admitting: *Deleted

## 2016-02-01 NOTE — Telephone Encounter (Signed)
Patient was called for follow up after previous telephone message. Patient and his wife were informed of Dr. Silvio Pate recommendations to eat a light diet, rest at home, use prep H supp bid for 3 days then bid prn. Patients were informed to call if bleeding worsened.

## 2016-02-01 NOTE — Telephone Encounter (Signed)
  Follow up Call-  Call back number 01/31/2016  Post procedure Call Back phone  # [phone]  Permission to leave phone message Yes  Some recent data might be hidden     Patient questions:  Do you have a fever, pain , or abdominal swelling? No. Pain Score  0 *  Have you tolerated food without any problems? Yes.    Have you been able to return to your normal activities? Yes.    Do you have any questions about your discharge instructions: Diet   No. Medications  No. Follow up visit  No.  Do you have questions or concerns about your Care? No.  Actions: * If pain score is 4 or above: No action needed, pain <4.  Dr. Fuller Plan,  This patient states that he is "dripping" dark blood from the rectum and has passed a clot about 1/2 inch in size this morning. Please advise.  Thank you, Olivia Mackie

## 2016-02-01 NOTE — Telephone Encounter (Signed)
Had cold polypectomies yesterday which could cause mild bleeding or hemorrhoids are mildly bleeding. Light diet, rest at home, prep H supp bid for 3 days then bid prn. Call if bleeding worsens.

## 2016-02-14 ENCOUNTER — Encounter: Payer: Self-pay | Admitting: Gastroenterology

## 2016-03-12 ENCOUNTER — Emergency Department (HOSPITAL_COMMUNITY)
Admission: EM | Admit: 2016-03-12 | Discharge: 2016-03-12 | Disposition: A | Discharge status: Home / Self Care | Payer: Medicare Other | Attending: Emergency Medicine | Admitting: Emergency Medicine

## 2016-03-12 ENCOUNTER — Encounter (HOSPITAL_COMMUNITY): Payer: Self-pay | Admitting: Emergency Medicine

## 2016-03-12 DIAGNOSIS — I252 Old myocardial infarction: Secondary | ICD-10-CM | POA: Diagnosis not present

## 2016-03-12 DIAGNOSIS — Z7982 Long term (current) use of aspirin: Secondary | ICD-10-CM | POA: Insufficient documentation

## 2016-03-12 DIAGNOSIS — J449 Chronic obstructive pulmonary disease, unspecified: Secondary | ICD-10-CM | POA: Diagnosis not present

## 2016-03-12 DIAGNOSIS — I10 Essential (primary) hypertension: Secondary | ICD-10-CM | POA: Insufficient documentation

## 2016-03-12 DIAGNOSIS — F17210 Nicotine dependence, cigarettes, uncomplicated: Secondary | ICD-10-CM | POA: Diagnosis not present

## 2016-03-12 DIAGNOSIS — K5900 Constipation, unspecified: Secondary | ICD-10-CM | POA: Insufficient documentation

## 2016-03-12 DIAGNOSIS — I2510 Atherosclerotic heart disease of native coronary artery without angina pectoris: Secondary | ICD-10-CM | POA: Insufficient documentation

## 2016-03-12 MED ORDER — LACTULOSE 10 GM/15ML PO SOLN: 10.0000 g | Freq: Every day | ORAL | 0 refills | Status: DC | PRN

## 2016-03-12 MED ORDER — MILK AND MOLASSES ENEMA
1.0000 | Freq: Once | RECTAL | Status: DC
  Filled 2016-03-12: qty 250

## 2016-03-12 MED ORDER — LACTULOSE 10 GM/15ML PO SOLN
20.0000 g | Freq: Once | ORAL | Status: DC
  Filled 2016-03-12: qty 30

## 2016-03-12 NOTE — ED Triage Notes (Signed)
Per [facility], Pt has had constipation for two days and was assessed by EMS two hours ago and was left in house. Called EMS again and reported that he passed out. When EMS arrived, pt was alert and oriented x4, walked to the truck, denied syncope, and denied any injury. Reports slight abdominal cramping with constipation. Vitals per EMS: 130/64, 65 HR, 18 RR, 119 CBG

## 2016-03-12 NOTE — ED Notes (Signed)
ED Provider at bedside. 

## 2016-03-12 NOTE — ED Notes (Signed)
Pt reports having large bowel movement a few minutes ago after he ambulated to the restroom, pt states he does not want to have abdominal xray and does not feel like he needs medication for constipation anymore.

## 2016-03-12 NOTE — ED Provider Notes (Signed)
[facility] DEPT Provider Note   CSN: [id number] Arrival date & time: 03/12/16  1038     History   Chief Complaint Chief Complaint  Patient presents with  . Near Syncope  . Constipation  . Abdominal Pain    HPI Steven Lowery is a 70 y.o. male.  Pt presents to the ED today with constipation.  He has not had a bm in 5 days.  He tried otc miralax which did not help.  Pt called EMS when he almost passed out trying to pass a bowel movement.  Pt denies cp.  He feels a lot of pressure in his abdomen and rectum.      Past Medical History:  Diagnosis Date  . Acute asthmatic bronchitis   . Alcohol abuse   . Back pain   . Benign prostatic hypertrophy   . Cigarette smoker   . Coronary artery disease   . DJD (degenerative joint disease)   . Hypertension   . IBS (irritable bowel syndrome)   . Myocardial infarction    per pt he thinks he had an MI "5 years ago"  . Neck pain   . Psychiatric disorder     Patient Active Problem List   Diagnosis Date Noted  . BPH (benign prostatic hyperplasia) 02/08/2015  . Decreased urine stream 02/08/2015  . 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 PSYCHIATRIC DISORDER 06/28/2008  . CIGARETTE SMOKER 06/28/2008  . Essential hypertension 06/28/2008  . CORONARY ARTERY DISEASE 06/28/2008  . ASTHMATIC BRONCHITIS, ACUTE 06/28/2008  . Osteoarthritis 06/28/2008  . NECK PAIN 06/28/2008  . Tobacco dependence 06/28/2008  . BACK PAIN 12/24/2007  . ALCOHOL ABUSE 05/12/2007  . IRRITABLE BOWEL SYNDROME, HX OF 05/12/2007    Past Surgical History:  Procedure Laterality Date  . CYSTOSCOPY  1999   Dr. Janice Norrie  . HAND SURGERY    . TONSILLECTOMY         Home Medications    Prior to Admission medications   Medication Sig Start Date End Date Taking? Authorizing Provider  amLODipine (NORVASC) 5 MG tablet Take 1 tablet (5 mg total) by mouth daily. 01/10/16  Yes Micheline Chapman, NP  aspirin EC 81 MG tablet Take 1 tablet (81 mg total) by mouth  daily. 08/03/15  Yes Dorena Dew, FNP  docusate sodium (COLACE) 100 MG capsule Take 1 capsule (100 mg total) by mouth daily. 08/10/15  Yes Dorena Dew, FNP  fish oil-omega-3 fatty acids 1000 MG capsule Take 1 g by mouth daily.    Yes Historical Provider, MD  Multiple Vitamin (MULTIVITAMIN WITH MINERALS) TABS tablet Take 1 tablet by mouth daily.   Yes Historical Provider, MD  OVER THE COUNTER MEDICATION Place 2 drops into both eyes daily as needed (Eye burning). Reported on 09/11/2015   Yes Historical Provider, MD  polyethylene glycol powder (GLYCOLAX/MIRALAX) powder Take 17 g by mouth 2 (two) times daily. Until daily soft stools  OTC Patient taking differently: Take 17 g by mouth daily as needed for moderate constipation. Until daily soft stools  OTC 01/02/16  Yes Larene Pickett, PA-C  tamsulosin [facility]) 0.4 MG CAPS capsule take 1 capsule by mouth once daily Patient taking differently: Take 0.4 mg by mouth once daily 08/10/15  Yes Dorena Dew, FNP  traMADol (ULTRAM) 50 MG tablet Take 1 tablet (50 mg total) by mouth every 6 (six) hours as needed. Patient taking differently: Take 50 mg by mouth every 6 (six) hours as needed for moderate pain.  09/07/15  Yes Carlisle Cater, PA-C  acetaminophen (TYLENOL) 325 MG tablet Take 2 tablets (650 mg total) by mouth every 6 (six) hours as needed for moderate pain. 09/16/15   Waynetta Pean, PA-C  ibuprofen (ADVIL,MOTRIN) 200 MG tablet Take 200 mg by mouth every 6 (six) hours as needed for moderate pain.    Historical Provider, MD  lactulose (CHRONULAC) 10 GM/15ML solution Take 15 mLs (10 g total) by mouth daily as needed for mild constipation (constipation). 03/12/16   Isla Pence, MD  nitroGLYCERIN (NITROSTAT) 0.4 MG SL tablet Place 1 tablet (0.4 mg total) under the tongue every 5 (five) minutes as needed (If you have possible cardiac chest pain not relieved by 3 NTG, call EMS.). For chest pain 11/14/14   Micheline Chapman, NP  omeprazole (PRILOSEC) 20 MG  capsule Take 1 capsule (20 mg total) by mouth daily. Patient not taking: Reported on 03/12/2016 10/08/15   Charlesetta Shanks, MD  senna-docusate (SENOKOT-S) 8.6-50 MG tablet Take 1 tablet by mouth 2 (two) times daily as needed for mild constipation or moderate constipation. 09/29/15   Forde Dandy, MD  sildenafil (VIAGRA) 25 MG tablet Take 1 tablet (25 mg total) by mouth daily as needed for erectile dysfunction. 09/11/15   Dorena Dew, FNP  sodium chloride (OCEAN) 0.65 % SOLN nasal spray Place 1 spray into both nostrils as needed for congestion. 06/03/14   Dahlia Bailiff, PA-C    Family History Family History  Problem Relation Age of Onset  . Breast cancer Sister   . Colon cancer Neg Hx   . Esophageal cancer Neg Hx   . Rectal cancer Neg Hx   . Stomach cancer Neg Hx     Social History Social History  Substance Use Topics  . Smoking status: Current Some Day Smoker    Packs/day: 0.30    Years: 40.00    Types: Cigarettes  . Smokeless tobacco: Never Used     Comment: has cut back alot per pt  . Alcohol use No     Comment: quit 3 months ago     Allergies   Patient has no known allergies.   Review of Systems Review of Systems  Gastrointestinal: Positive for abdominal pain and constipation.  All other systems reviewed and are negative.    Physical Exam Updated Vital Signs BP 136/76   Pulse 71   Temp 98.3 F (36.8 C) (Oral)   Resp 14   Ht 6\' 2"  (1.88 m)   Wt 175 lb (79.4 kg)   SpO2 99%   BMI 22.47 kg/m   Physical Exam  Constitutional: He is oriented to person, place, and time. He appears well-developed and well-nourished.  HENT:  Head: Normocephalic and atraumatic.  Right Ear: External ear normal.  Left Ear: External ear normal.  Nose: Nose normal.  Mouth/Throat: Oropharynx is clear and moist.  Eyes: Conjunctivae and EOM are normal. Pupils are equal, round, and reactive to light.  Neck: Normal range of motion. Neck supple.  Cardiovascular: Normal rate, regular rhythm,  normal heart sounds and intact distal pulses.   Pulmonary/Chest: Effort normal and breath sounds normal.  Abdominal: Soft. Bowel sounds are decreased.  Musculoskeletal: Normal range of motion.  Neurological: He is alert and oriented to person, place, and time.  Skin: Skin is warm.  Psychiatric: He has a normal mood and affect. His behavior is normal. Judgment and thought content normal.  Nursing note and vitals reviewed.    ED Treatments / Results  Labs (all  labs ordered are listed, but only abnormal results are displayed) Labs Reviewed - No data to display  EKG  EKG Interpretation None       Radiology No results found.  Procedures Procedures (including critical care time)  Medications Ordered in ED Medications - No data to display   Initial Impression / Assessment and Plan / ED Course  I have reviewed the triage vital signs and the nursing notes.  Pertinent labs & imaging results that were available during my care of the patient were reviewed by me and considered in my medical decision making (see chart for details).  Clinical Course     Shortly after pt's arrival, the miralax worked and he had a large bm in the bathroom.  He refused the xray and enema.  He feels better.  He knows to return if worse.  Final Clinical Impressions(s) / ED Diagnoses   Final diagnoses:  Constipation, unspecified constipation type    New Prescriptions New Prescriptions   LACTULOSE (CHRONULAC) 10 GM/15ML SOLUTION    Take 15 mLs (10 g total) by mouth daily as needed for mild constipation (constipation).     Isla Pence, MD 03/12/16 [phone]

## 2016-03-12 NOTE — ED Notes (Signed)
Pt denies passing out, but he states he was lightheaded.

## 2016-03-18 ENCOUNTER — Other Ambulatory Visit: Payer: Self-pay

## 2016-03-18 NOTE — Patient Outreach (Signed)
[facility]) Care Management  03/18/2016  Steven Lowery [birth year] [id number]   Referral Date: 03-13-16  Referral Source: ED utilizer report  Referral Reason: High ED Utilization  Outreach Attempt: First Attempt successful  Social: Patient married and lives with wife. Patient able to get to appointments.  Conditions: Patient admits to Chronic Back pain with opioid use, constipation, and HTN.  Patient denies COPD. Patient reports that he has no problems with blood pressure as he is on medications. Patient does report having frequent ED visits due to constipation. He states he is on Miralax and does not take it as ordered and ends up constipated.  Educated patient on the importance of taking medications as prescribed.  Also discussed with patient importance of not going longer than 3 days without a bowel movement.  He verbalized understanding.   Medications: Patient has no questions or problems with his medications. Services: Patient has declined services at this time as he feels as long as he has a handle on his bowel movements he is fine.  Plan: RN Health Coach will notify care management assistant to close case.  RN Health Coach will send letter and brochure.    Jone Baseman, RN, MSN [place] [phone]

## 2016-05-01 ENCOUNTER — Telehealth: Payer: Self-pay

## 2016-05-01 ENCOUNTER — Other Ambulatory Visit: Payer: Self-pay | Admitting: Family Medicine

## 2016-05-01 DIAGNOSIS — I10 Essential (primary) hypertension: Secondary | ICD-10-CM

## 2016-05-01 MED ORDER — AMLODIPINE BESYLATE 5 MG PO TABS: 5.0000 mg | ORAL_TABLET | Freq: Every day | ORAL | 2 refills | Status: DC

## 2016-05-01 MED ORDER — TAMSULOSIN HCL 0.4 MG PO CAPS: 0.4000 mg | ORAL_CAPSULE | Freq: Every day | ORAL | 3 refills | Status: DC

## 2016-05-01 NOTE — Telephone Encounter (Signed)
These medications have been refilled and sent into pharmacy. Thanks!

## 2016-06-05 ENCOUNTER — Telehealth: Payer: Self-pay

## 2016-06-05 MED ORDER — TAMSULOSIN HCL 0.4 MG PO CAPS: 0.4000 mg | ORAL_CAPSULE | Freq: Every day | ORAL | 3 refills | Status: DC

## 2016-06-05 NOTE — Telephone Encounter (Signed)
This has been sent into pharmacy. Thanks!  

## 2016-06-08 ENCOUNTER — Other Ambulatory Visit: Payer: Self-pay | Admitting: Family Medicine

## 2016-06-08 DIAGNOSIS — N400 Enlarged prostate without lower urinary tract symptoms: Secondary | ICD-10-CM

## 2016-06-13 ENCOUNTER — Ambulatory Visit: Payer: Medicare Other | Admitting: Family Medicine

## 2016-06-17 ENCOUNTER — Telehealth: Payer: Self-pay | Admitting: Family Medicine

## 2016-06-17 ENCOUNTER — Other Ambulatory Visit: Payer: Self-pay

## 2016-06-17 ENCOUNTER — Telehealth: Payer: Self-pay

## 2016-06-17 NOTE — Telephone Encounter (Signed)
Tried to contact patient to discuss his request for a Flomax refill, phone is out of service.

## 2016-06-17 NOTE — Telephone Encounter (Signed)
Tried to contact patient to discuss, phone is out of service. Just FYI in case he calls tomorrow.

## 2016-06-24 ENCOUNTER — Telehealth: Payer: Self-pay | Admitting: Family Medicine

## 2016-06-24 NOTE — Telephone Encounter (Signed)
Tried to contact patient regarding refill on Flomax, phone is not in service.

## 2016-06-27 ENCOUNTER — Ambulatory Visit: Payer: Medicare Other | Admitting: Family Medicine

## 2016-07-01 ENCOUNTER — Ambulatory Visit: Payer: Medicare Other | Admitting: Family Medicine

## 2016-07-22 ENCOUNTER — Ambulatory Visit: Payer: Medicare Other | Admitting: Family Medicine

## 2016-08-05 ENCOUNTER — Encounter: Payer: Self-pay | Admitting: Family Medicine

## 2016-08-05 ENCOUNTER — Ambulatory Visit (INDEPENDENT_AMBULATORY_CARE_PROVIDER_SITE_OTHER): Payer: Medicare Other | Admitting: Family Medicine

## 2016-08-05 VITALS — BP 126/75 | HR 83 | Temp 98.7°F | Resp 14 | Ht 74.0 in | Wt 161.0 lb

## 2016-08-05 DIAGNOSIS — Z23 Encounter for immunization: Secondary | ICD-10-CM | POA: Diagnosis not present

## 2016-08-05 DIAGNOSIS — K219 Gastro-esophageal reflux disease without esophagitis: Secondary | ICD-10-CM

## 2016-08-05 DIAGNOSIS — R39198 Other difficulties with micturition: Secondary | ICD-10-CM

## 2016-08-05 DIAGNOSIS — N401 Enlarged prostate with lower urinary tract symptoms: Secondary | ICD-10-CM | POA: Diagnosis not present

## 2016-08-05 DIAGNOSIS — F17200 Nicotine dependence, unspecified, uncomplicated: Secondary | ICD-10-CM

## 2016-08-05 DIAGNOSIS — R338 Other retention of urine: Secondary | ICD-10-CM

## 2016-08-05 DIAGNOSIS — R399 Unspecified symptoms and signs involving the genitourinary system: Secondary | ICD-10-CM | POA: Diagnosis not present

## 2016-08-05 DIAGNOSIS — I10 Essential (primary) hypertension: Secondary | ICD-10-CM | POA: Diagnosis not present

## 2016-08-05 LAB — POCT URINALYSIS DIP (DEVICE)
BILIRUBIN URINE: NEGATIVE
GLUCOSE, UA: NEGATIVE mg/dL
KETONES UR: NEGATIVE mg/dL
LEUKOCYTES UA: NEGATIVE
Nitrite: NEGATIVE
Protein, ur: NEGATIVE mg/dL
Specific Gravity, Urine: 1.015 (ref 1.005–1.030)
Urobilinogen, UA: 1 mg/dL (ref 0.0–1.0)
pH: 6.5 (ref 5.0–8.0)

## 2016-08-05 LAB — COMPLETE METABOLIC PANEL WITH GFR
ALBUMIN: 4.4 g/dL (ref 3.6–5.1)
ALK PHOS: 70 U/L (ref 40–115)
ALT: 10 U/L (ref 9–46)
AST: 15 U/L (ref 10–35)
BUN: 13 mg/dL (ref 7–25)
CALCIUM: 9.9 mg/dL (ref 8.6–10.3)
CHLORIDE: 105 mmol/L (ref 98–110)
CO2: 27 mmol/L (ref 20–31)
Creat: 0.98 mg/dL (ref 0.70–1.18)
GFR, EST NON AFRICAN AMERICAN: 78 mL/min (ref >=60)
GFR, Est African American: >89 mL/min (ref >=60)
Glucose, Bld: 107 mg/dL — ABNORMAL HIGH (ref 65–99)
POTASSIUM: 4.5 mmol/L (ref 3.5–5.3)
SODIUM: 141 mmol/L (ref 135–146)
Total Bilirubin: 0.6 mg/dL (ref 0.2–1.2)
Total Protein: 7.2 g/dL (ref 6.1–8.1)

## 2016-08-05 MED ORDER — TADALAFIL 5 MG PO TABS: 5.0000 mg | ORAL_TABLET | Freq: Every day | ORAL | 2 refills | Status: DC

## 2016-08-05 MED ORDER — AMLODIPINE BESYLATE 5 MG PO TABS: 5.0000 mg | ORAL_TABLET | Freq: Every day | ORAL | 1 refills | Status: DC

## 2016-08-05 MED ORDER — OMEPRAZOLE 20 MG PO CPDR: 20.0000 mg | DELAYED_RELEASE_CAPSULE | Freq: Every day | ORAL | 5 refills | Status: DC

## 2016-08-05 NOTE — Patient Instructions (Signed)
Lower urinary tract symptoms: Will start a trial of Cialis 5 mg daily. Will follow up by phone with laboratory results.    Hypertension: Continue Amlodipine 5 mg daily for hypertension. Blood pressure is controlled on current medication regimen  Tadalafil tablets (Cialis) What is this medicine? TADALAFIL (tah DA la fil) is used to treat erection problems in men. It is also used for enlargement of the prostate gland in men, a condition called benign prostatic hyperplasia or BPH. This medicine improves urine flow and reduces BPH symptoms. This medicine can also treat both erection problems and BPH when they occur together. This medicine may be used for other purposes; ask your health care provider or pharmacist if you have questions. COMMON BRAND NAME(S): Cialis What should I tell my health care provider before I take this medicine? They need to know if you have any of these conditions: -bleeding disorders -eye or vision problems, including a rare inherited eye disease called retinitis pigmentosa -anatomical deformation of the penis, Peyronie's disease, or history of priapism (painful and prolonged erection) -heart disease, angina, a history of heart attack, irregular heart beats, or other heart problems -high or low blood pressure -history of blood diseases, like sickle cell anemia or leukemia -history of stomach bleeding -kidney disease -liver disease -stroke -an unusual or allergic reaction to tadalafil, other medicines, foods, dyes, or preservatives -pregnant or trying to get pregnant -breast-feeding How should I use this medicine? Take this medicine by mouth with a glass of water. Follow the directions on the prescription label. You may take this medicine with or without meals. When this medicine is used for erection problems, your doctor may prescribe it to be taken once daily or as needed. If you are taking the medicine as needed, you may be able to have sexual activity 30 minutes  after taking it and for up to 36 hours after taking it. Whether you are taking the medicine as needed or once daily, you should not take more than one dose per day. If you are taking this medicine for symptoms of benign prostatic hyperplasia (BPH) or to treat both BPH and an erection problem, take the dose once daily at about the same time each day. Do not take your medicine more often than directed. Talk to your pediatrician regarding the use of this medicine in children. Special care may be needed. Overdosage: If you think you have taken too much of this medicine contact a poison control center or emergency room at once. NOTE: This medicine is only for you. Do not share this medicine with others. What if I miss a dose? If you are taking this medicine as needed for erection problems, this does not apply. If you miss a dose while taking this medicine once daily for an erection problem, benign prostatic hyperplasia, or both, take it as soon as you remember, but do not take more than one dose per day. What may interact with this medicine? Do not take this medicine with any of the following medications: -nitrates like amyl nitrite, isosorbide dinitrate, isosorbide mononitrate, nitroglycerin -other medicines for erectile dysfunction like avanafil, sildenafil, vardenafil -other tadalafil products (Adcirca) -riociguat This medicine may also interact with the following medications: -certain drugs for high blood pressure -certain drugs for the treatment of HIV infection or AIDS -certain drugs used for fungal or yeast infections, like fluconazole, itraconazole, ketoconazole, and voriconazole -certain drugs used for seizures like carbamazepine, phenytoin, and phenobarbital -grapefruit juice -macrolide antibiotics like clarithromycin, erythromycin, troleandomycin -medicines for prostate problems -  rifabutin, rifampin or rifapentine This list may not describe all possible interactions. Give your health care  provider a list of all the medicines, herbs, non-prescription drugs, or dietary supplements you use. Also tell them if you smoke, drink alcohol, or use illegal drugs. Some items may interact with your medicine. What should I watch for while using this medicine? If you notice any changes in your vision while taking this drug, call your doctor or health care professional as soon as possible. Stop using this medicine and call your health care provider right away if you have a loss of sight in one or both eyes. Contact your doctor or health care professional right away if the erection lasts longer than 4 hours or if it becomes painful. This may be a sign of serious problem and must be treated right away to prevent permanent damage. If you experience symptoms of nausea, dizziness, chest pain or arm pain upon initiation of sexual activity after taking this medicine, you should refrain from further activity and call your doctor or health care professional as soon as possible. Do not drink alcohol to excess (examples, 5 glasses of wine or 5 shots of whiskey) when taking this medicine. When taken in excess, alcohol can increase your chances of getting a headache or getting dizzy, increasing your heart rate or lowering your blood pressure. Using this medicine does not protect you or your partner against HIV infection (the virus that causes AIDS) or other sexually transmitted diseases. What side effects may I notice from receiving this medicine? Side effects that you should report to your doctor or health care professional as soon as possible: -allergic reactions like skin rash, itching or hives, swelling of the face, lips, or tongue -breathing problems -changes in hearing -changes in vision -chest pain -fast, irregular heartbeat -prolonged or painful erection -seizures Side effects that usually do not require medical attention (report to your doctor or health care professional if they continue or are  bothersome): -back pain -dizziness -flushing -headache -indigestion -muscle aches -nausea -stuffy or runny nose This list may not describe all possible side effects. Call your doctor for medical advice about side effects. You may report side effects to FDA at 1-800-FDA-1088. Where should I keep my medicine? Keep out of the reach of children. Store at room temperature between 15 and 30 degrees C (59 and 86 degrees F). Throw away any unused medicine after the expiration date. NOTE: This sheet is a summary. It may not cover all possible information. If you have questions about this medicine, talk to your doctor, pharmacist, or health care provider.  2018 Elsevier/Gold Standard (2013-09-10 13:15:49)

## 2016-08-05 NOTE — Progress Notes (Signed)
Subjective:    Patient ID: Steven Lowery, male    DOB: 1945/07/24, 71 y.o.   MRN: [id number] Mr. Steven Lowery, a 71 year old male with a history of hypertension and urinary retention presents for a follow up of chronic conditions.  Hypertension  The current episode started more than 1 month ago. The problem has been gradually improving since onset. The problem is controlled. Associated symptoms include neck pain. Pertinent negatives include no anxiety, blurred vision, orthopnea, palpitations, shortness of breath or sweats. There are no associated agents to hypertension. Risk factors for coronary artery disease include dyslipidemia, smoking/tobacco exposure and male gender. Past treatments include ACE inhibitors and calcium channel blockers. The current treatment provides moderate improvement. There are no compliance problems.  There is no history of angina, kidney disease, CAD/MI, CVA, heart failure, left ventricular hypertrophy, PVD or retinopathy. There is no history of chronic renal disease, coarctation of the aorta, hyperaldosteronism, hypercortisolism, hyperparathyroidism, a hypertension causing med, pheochromocytoma, sleep apnea or a thyroid problem.  Lower Urinary Tract Symptoms Mr. Steven Lowery is also complaining of lower urinary tract symptoms. He has periodic urinary hesitancy, urinary retention and a weak urine stream. He says that symptoms have been present for greater than 1 year. He was started on Flomax with minimal relief. He denies polyuria, hematuria, dysuria,  or abdominal pain.  Past Medical History:  Diagnosis Date  . Acute asthmatic bronchitis   . Alcohol abuse   . Back pain   . Benign prostatic hypertrophy   . Cigarette smoker   . Coronary artery disease   . DJD (degenerative joint disease)   . Hypertension   . IBS (irritable bowel syndrome)   . Myocardial infarction    per pt he thinks he had an MI "5 years ago"  . Neck pain   . Psychiatric disorder    Social History    Social History  . Marital status: Married    Spouse name: N/A  . Number of children: N/A  . Years of education: N/A   Occupational History  . Not on file.   Social History Main Topics  . Smoking status: Current Some Day Smoker    Packs/day: 0.30    Years: 40.00    Types: Cigarettes  . Smokeless tobacco: Never Used     Comment: has cut back alot per pt  . Alcohol use No     Comment: quit 3 months ago  . Drug use: No  . Sexual activity: Yes   Other Topics Concern  . Not on file   Social History Narrative  . No narrative on file   Immunization History  Administered Date(s) Administered  . Influenza Split 01/16/2011, 04/27/2012, 07/16/2013  . Pneumococcal Polysaccharide-23 04/17/2015  . Tdap 12/16/2014  No Known Allergies Review of Systems  Constitutional: Negative.   HENT: Negative.   Eyes: Negative.  Negative for blurred vision.  Respiratory: Negative.  Negative for shortness of breath.   Cardiovascular: Negative.  Negative for palpitations and orthopnea.  Gastrointestinal: Negative.  Negative for blood in stool, constipation, diarrhea and rectal pain.  Endocrine: Negative.  Negative for polydipsia, polyphagia and polyuria.  Genitourinary: Positive for decreased urine volume, difficulty urinating and urgency. Negative for discharge, frequency, hematuria, penile pain, penile swelling and testicular pain.       Occasional Erectile dysfunction  Musculoskeletal: Positive for arthralgias and neck pain.       Patient is under the care of pain management for chronic back pain  Skin: Negative.  Allergic/Immunologic: Negative.   Neurological: Negative.  Negative for dizziness.  Hematological: Negative.   Psychiatric/Behavioral: Negative.  Negative for suicidal ideas.      Objective:   Physical Exam  Constitutional: He is oriented to person, place, and time. He appears well-developed and well-nourished.  HENT:  Head: Normocephalic and atraumatic.  Right Ear: External  ear normal.  Left Ear: External ear normal.  Mouth/Throat: Oropharynx is clear and moist.  Eyes: Conjunctivae and EOM are normal. Pupils are equal, round, and reactive to light.  Neck: Neck rigidity present. Decreased range of motion present. No edema present.  Cardiovascular: Normal rate, regular rhythm, normal heart sounds and intact distal pulses.   Pulmonary/Chest: Effort normal and breath sounds normal.  Abdominal: Soft. Bowel sounds are normal.  Genitourinary: Rectum normal, prostate normal, testes normal and penis normal. Rectal exam shows no fissure, no tenderness, anal tone normal and guaiac negative stool. Prostate is not enlarged and not tender.  Neurological: He is alert and oriented to person, place, and time. He has normal reflexes.  Skin: Skin is warm and dry.  Psychiatric: He has a normal mood and affect. His behavior is normal. Judgment and thought content normal.     BP 126/75 (BP Location: Left Arm, Patient Position: Sitting, Cuff Size: Normal)   Pulse 83   Temp 98.7 F (37.1 C) (Oral)   Resp 14   Ht 6\' 2"  (1.88 m)   Wt 161 lb (73 kg)   SpO2 99%   BMI 20.67 kg/m  Assessment & Plan:  1. Essential hypertension Blood pressure is controlled on current medication - COMPLETE METABOLIC PANEL WITH GFR  2. Lower urinary tract symptoms Will start a trial of cialis for lower urinary tract symptoms. Will follow up by phone with laboratory results.  - tadalafil (CIALIS) 5 MG tablet; Take 1 tablet (5 mg total) by mouth daily.  Dispense: 30 tablet; Refill: 2 - COMPLETE METABOLIC PANEL WITH GFR - PSA  3. Decreased urine stream -Urinalysis  4. Benign prostatic hyperplasia with urinary retention Previous prostate exam on 09/21/2015. Will repeat at scheduled CPE.  - PSA   5. Gastroesophageal reflux disease without esophagitis The patient is asked to make an attempt to improve diet and exercise patterns to aid in medical management of this problem. - omeprazole (PRILOSEC) 20  MG capsule; Take 1 capsule (20 mg total) by mouth daily.  Dispense: 30 capsule; Refill: 5 - COMPLETE METABOLIC PANEL WITH GFR  6. Immunization due - Pneumococcal conjugate vaccine 13-valent  7. Tobacco dependence Smoking cessation instruction/counseling given:  counseled patient on the dangers of tobacco use, advised patient to stop smoking, and reviewed strategies to maximize success  The patient was given clear instructions to go to ER or return to medical center if symptoms do not improve, worsen or new problems develop. The patient verbalized understanding. Will notify patient with laboratory results. Dorena Dew, FNP

## 2016-08-06 LAB — PSA: PSA: 1.9 ng/mL (ref <=4.0)

## 2016-09-19 ENCOUNTER — Ambulatory Visit (INDEPENDENT_AMBULATORY_CARE_PROVIDER_SITE_OTHER): Payer: Medicare Other | Admitting: Physician Assistant

## 2016-09-19 ENCOUNTER — Encounter (INDEPENDENT_AMBULATORY_CARE_PROVIDER_SITE_OTHER): Payer: Self-pay | Admitting: Physician Assistant

## 2016-09-19 VITALS — BP 122/71 | HR 85 | Temp 97.7°F | Ht 72.0 in | Wt 160.2 lb

## 2016-09-19 DIAGNOSIS — G8929 Other chronic pain: Secondary | ICD-10-CM

## 2016-09-19 DIAGNOSIS — M5442 Lumbago with sciatica, left side: Secondary | ICD-10-CM

## 2016-09-19 DIAGNOSIS — M542 Cervicalgia: Secondary | ICD-10-CM

## 2016-09-19 MED ORDER — CELECOXIB 100 MG PO CAPS: 100.0000 mg | ORAL_CAPSULE | Freq: Two times a day (BID) | ORAL | 0 refills | Status: DC

## 2016-09-19 MED ORDER — GABAPENTIN 100 MG PO CAPS: 100.0000 mg | ORAL_CAPSULE | Freq: Three times a day (TID) | ORAL | 3 refills | Status: DC

## 2016-09-19 NOTE — Patient Instructions (Signed)
Gabapentin capsules or tablets What is this medicine? GABAPENTIN (GA ba pen tin) is used to control partial seizures in adults with epilepsy. It is also used to treat certain types of nerve pain. This medicine may be used for other purposes; ask your health care provider or pharmacist if you have questions. COMMON BRAND NAME(S): Active-PAC with Gabapentin, Gabarone, Neurontin What should I tell my health care provider before I take this medicine? They need to know if you have any of these conditions: -kidney disease -suicidal thoughts, plans, or attempt; a previous suicide attempt by you or a family member -an unusual or allergic reaction to gabapentin, other medicines, foods, dyes, or preservatives -pregnant or trying to get pregnant -breast-feeding How should I use this medicine? Take this medicine by mouth with a glass of water. Follow the directions on the prescription label. You can take it with or without food. If it upsets your stomach, take it with food.Take your medicine at regular intervals. Do not take it more often than directed. Do not stop taking except on your doctor's advice. If you are directed to break the 600 or 800 mg tablets in half as part of your dose, the extra half tablet should be used for the next dose. If you have not used the extra half tablet within 28 days, it should be thrown away. A special MedGuide will be given to you by the pharmacist with each prescription and refill. Be sure to read this information carefully each time. Talk to your pediatrician regarding the use of this medicine in children. Special care may be needed. Overdosage: If you think you have taken too much of this medicine contact a poison control center or emergency room at once. NOTE: This medicine is only for you. Do not share this medicine with others. What if I miss a dose? If you miss a dose, take it as soon as you can. If it is almost time for your next dose, take only that dose. Do not  take double or extra doses. What may interact with this medicine? Do not take this medicine with any of the following medications: -other gabapentin products This medicine may also interact with the following medications: -alcohol -antacids -antihistamines for allergy, cough and cold -certain medicines for anxiety or sleep -certain medicines for depression or psychotic disturbances -homatropine; hydrocodone -naproxen -narcotic medicines (opiates) for pain -phenothiazines like chlorpromazine, mesoridazine, prochlorperazine, thioridazine This list may not describe all possible interactions. Give your health care provider a list of all the medicines, herbs, non-prescription drugs, or dietary supplements you use. Also tell them if you smoke, drink alcohol, or use illegal drugs. Some items may interact with your medicine. What should I watch for while using this medicine? Visit your doctor or health care professional for regular checks on your progress. You may want to keep a record at home of how you feel your condition is responding to treatment. You may want to share this information with your doctor or health care professional at each visit. You should contact your doctor or health care professional if your seizures get worse or if you have any new types of seizures. Do not stop taking this medicine or any of your seizure medicines unless instructed by your doctor or health care professional. Stopping your medicine suddenly can increase your seizures or their severity. Wear a medical identification bracelet or chain if you are taking this medicine for seizures, and carry a card that lists all your medications. You may get drowsy, dizzy,   or have blurred vision. Do not drive, use machinery, or do anything that needs mental alertness until you know how this medicine affects you. To reduce dizzy or fainting spells, do not sit or stand up quickly, especially if you are an older patient. Alcohol can  increase drowsiness and dizziness. Avoid alcoholic drinks. Your mouth may get dry. Chewing sugarless gum or sucking hard candy, and drinking plenty of water will help. The use of this medicine may increase the chance of suicidal thoughts or actions. Pay special attention to how you are responding while on this medicine. Any worsening of mood, or thoughts of suicide or dying should be reported to your health care professional right away. Women who become pregnant while using this medicine may enroll in the [place] Antiepileptic Drug Pregnancy Registry by calling 1-888-233-2334. This registry collects information about the safety of antiepileptic drug use during pregnancy. What side effects may I notice from receiving this medicine? Side effects that you should report to your doctor or health care professional as soon as possible: -allergic reactions like skin rash, itching or hives, swelling of the face, lips, or tongue -worsening of mood, thoughts or actions of suicide or dying Side effects that usually do not require medical attention (report to your doctor or health care professional if they continue or are bothersome): -constipation -difficulty walking or controlling muscle movements -dizziness -nausea -slurred speech -tiredness -tremors -weight gain This list may not describe all possible side effects. Call your doctor for medical advice about side effects. You may report side effects to FDA at 1-800-FDA-1088. Where should I keep my medicine? Keep out of reach of children. This medicine may cause accidental overdose and death if it taken by other adults, children, or pets. Mix any unused medicine with a substance like cat litter or coffee grounds. Then throw the medicine away in a sealed container like a sealed bag or a coffee can with a lid. Do not use the medicine after the expiration date. Store at room temperature between 15 and 30 degrees C (59 and 86 degrees F). NOTE: This  sheet is a summary. It may not cover all possible information. If you have questions about this medicine, talk to your doctor, pharmacist, or health care provider.  2018 Elsevier/Gold Standard (2013-06-18 15:26:50)  

## 2016-09-19 NOTE — Progress Notes (Signed)
Subjective:  Patient ID: Steven Lowery, male    DOB: Jun 23, 1945  Age: 71 y.o. MRN: [id number]  CC: Neck pain and LBP  HPI [name] is a 71 y.o. male with a PMH of neck pain, LBP, BPH, IBS, CAD, MI, and alcohol abuse. He presents to establish care for his chronic neck pain and low back pain. CT C spine in 2016 revealed moderate degenerative changes.  MRI L spine 2004 revealed "rather impressive" degenerative disc disease at L5 - S1 with small central and bilateral paracentral disc herniation. Small disc protrusions at other levels. Mild to moderate stenosis of central canal and lateral recesses at mulitple levels, no severe stenosis. Patient has been to physical therapy with no long term benefit. He wears a back brace daily. Has episodes of sciatic pain mostly down the left leg. Denies saddle paresthesia, urinary incontinence, or bowel incontinence. Does not endorse any other symptoms.      Outpatient Medications Prior to Visit  Medication Sig Dispense Refill  . acetaminophen (TYLENOL) 325 MG tablet Take 2 tablets (650 mg total) by mouth every 6 (six) hours as needed for moderate pain. 60 tablet 0  . amLODipine (NORVASC) 5 MG tablet Take 1 tablet (5 mg total) by mouth daily. 90 tablet 1  . aspirin EC 81 MG tablet Take 1 tablet (81 mg total) by mouth daily. 90 tablet 3  . fish oil-omega-3 fatty acids 1000 MG capsule Take 1 g by mouth daily.     [name] lactulose (CHRONULAC) 10 GM/15ML solution Take 15 mLs (10 g total) by mouth daily as needed for mild constipation (constipation). 240 mL 0  . Multiple Vitamin (MULTIVITAMIN WITH MINERALS) TABS tablet Take 1 tablet by mouth daily.    [name] omeprazole (PRILOSEC) 20 MG capsule Take 1 capsule (20 mg total) by mouth daily. 30 capsule 5  . OVER THE COUNTER MEDICATION Place 2 drops into both eyes daily as needed (Eye burning). Reported on 09/11/2015    . senna-docusate (SENOKOT-S) 8.6-50 MG tablet Take 1 tablet by mouth 2 (two) times daily as needed for mild  constipation or moderate constipation. 60 tablet 0  . sodium chloride (OCEAN) 0.65 % SOLN nasal spray Place 1 spray into both nostrils as needed for congestion. 30 mL 0  . tamsulosin (FLOMAX) 0.4 MG CAPS capsule Take 1 capsule (0.4 mg total) by mouth daily. 30 capsule 3  . traMADol (ULTRAM) 50 MG tablet Take 1 tablet (50 mg total) by mouth every 6 (six) hours as needed. (Patient taking differently: Take 50 mg by mouth every 6 (six) hours as needed for moderate pain. ) 21 tablet 0  . docusate sodium (COLACE) 100 MG capsule Take 1 capsule (100 mg total) by mouth daily. 30 capsule 0  . tadalafil (CIALIS) 5 MG tablet Take 1 tablet (5 mg total) by mouth daily. (Patient not taking: Reported on 09/19/2016) 30 tablet 2   Facility-Administered Medications Prior to Visit  Medication Dose Route Frequency Provider Last Rate Last Dose  . 0.9 %  sodium chloride infusion  500 mL Intravenous Continuous Ladene Artist, MD         ROS Review of Systems  Constitutional: Negative for chills, fever and malaise/fatigue.  Eyes: Negative for blurred vision.  Respiratory: Negative for shortness of breath.   Cardiovascular: Negative for chest pain and palpitations.  Gastrointestinal: Negative for abdominal pain and nausea.  Genitourinary: Negative for dysuria and hematuria.  Musculoskeletal: Positive for back pain and neck pain. Negative for joint  pain and myalgias.  Skin: Negative for rash.  Neurological: Negative for tingling and headaches.  Psychiatric/Behavioral: Negative for depression. The patient is not nervous/anxious.     Objective:  BP 122/71   Pulse 85   Temp 97.7 F (36.5 C) (Oral)   Ht 6' (1.829 m)   Wt 160 lb 3.2 oz (72.7 kg)   SpO2 98%   BMI 21.73 kg/m   BP/Weight 09/19/2016 08/05/2016 60/10/3014  Systolic BP 010 932 355  Diastolic BP 71 75 76  Wt. (Lbs) 160.2 161 175  BMI 21.73 20.67 22.47      Physical Exam  Constitutional: He is oriented to person, place, and time.  Tall, thin,  appears younger than stated age, well groomed, NAD, polite  HENT:  Head: Normocephalic and atraumatic.  Eyes: No scleral icterus.  Neck: Normal range of motion. Neck supple. No thyromegaly present.  Cardiovascular: Normal rate, regular rhythm and normal heart sounds.   Pulmonary/Chest: Effort normal and breath sounds normal.  Musculoskeletal: He exhibits no edema.  Mildly limited aROM of the back with flexion. Lateral flexion and rotation normal. Full aROM of neck.  Neurological: He is alert and oriented to person, place, and time. No cranial nerve deficit. Coordination normal.  Skin: Skin is warm and dry. No rash noted. No erythema. No pallor.  Psychiatric: He has a normal mood and affect. His behavior is normal. Thought content normal.  Vitals reviewed.    Assessment & Plan:   1. Cervicalgia - Begin celecoxib (CELEBREX) 100 MG capsule; Take 1 capsule (100 mg total) by mouth 2 (two) times daily.  Dispense: 60 capsule; Refill: 0 - Begin gabapentin (NEURONTIN) 100 MG capsule; Take 1 capsule (100 mg total) by mouth 3 (three) times daily.  Dispense: 90 capsule; Refill: 3  2. Chronic midline low back pain with left-sided sciatica - Begin celecoxib (CELEBREX) 100 MG capsule; Take 1 capsule (100 mg total) by mouth 2 (two) times daily.  Dispense: 60 capsule; Refill: 0 - Begin gabapentin (NEURONTIN) 100 MG capsule; Take 1 capsule (100 mg total) by mouth 3 (three) times daily.  Dispense: 90 capsule; Refill: 3   Meds ordered this encounter  Medications  . celecoxib (CELEBREX) 100 MG capsule    Sig: Take 1 capsule (100 mg total) by mouth 2 (two) times daily.    Dispense:  60 capsule    Refill:  0    Order Specific Question:   Supervising Provider    Answer:   Tresa Garter [id number]  . gabapentin (NEURONTIN) 100 MG capsule    Sig: Take 1 capsule (100 mg total) by mouth 3 (three) times daily.    Dispense:  90 capsule    Refill:  3    Order Specific Question:   Supervising Provider     Answer:   Tresa Garter [id number]    Follow-up: Return in about 4 weeks (around 10/17/2016) for Cervicalgia and LBP.   Clent Demark PA

## 2016-10-10 ENCOUNTER — Ambulatory Visit (INDEPENDENT_AMBULATORY_CARE_PROVIDER_SITE_OTHER): Payer: Medicare Other | Admitting: Physician Assistant

## 2016-10-10 ENCOUNTER — Encounter (INDEPENDENT_AMBULATORY_CARE_PROVIDER_SITE_OTHER): Payer: Self-pay | Admitting: Physician Assistant

## 2016-10-10 VITALS — BP 147/79 | HR 63 | Temp 97.6°F | Wt 161.4 lb

## 2016-10-10 DIAGNOSIS — M542 Cervicalgia: Secondary | ICD-10-CM

## 2016-10-10 DIAGNOSIS — M5442 Lumbago with sciatica, left side: Secondary | ICD-10-CM

## 2016-10-10 DIAGNOSIS — G8929 Other chronic pain: Secondary | ICD-10-CM | POA: Diagnosis not present

## 2016-10-10 MED ORDER — TRAMADOL HCL 50 MG PO TABS: 50.0000 mg | ORAL_TABLET | Freq: Three times a day (TID) | ORAL | 0 refills | Status: DC

## 2016-10-10 MED ORDER — CELECOXIB 100 MG PO CAPS: 100.0000 mg | ORAL_CAPSULE | Freq: Two times a day (BID) | ORAL | 5 refills | Status: DC

## 2016-10-10 MED ORDER — GABAPENTIN 300 MG PO CAPS: 300.0000 mg | ORAL_CAPSULE | Freq: Three times a day (TID) | ORAL | 3 refills | Status: DC

## 2016-10-10 NOTE — Progress Notes (Signed)
Subjective:  Patient ID: Steven Lowery, male    DOB: 20-Apr-1946  Age: 71 y.o. MRN: [id number]  CC: neck and back pain.  HPI Steven Lowery is a 71 y.o. male with a PMH of neck pain, LBP, BPH, IBS, CAD, MI, and alcohol abuse. CT C spine in 2016 revealed moderate degenerative changes.  MRI L spine 2004 revealed "rather impressive" degenerative disc disease at L5 - S1 with small central and bilateral paracentral disc herniation. Small disc protrusions at other levels. Mild to moderate stenosis of central canal and lateral recesses at mulitple levels, no severe stenosis. Patient has been to physical therapy with no long term benefit. He wears a back brace daily. Has episodes of sciatic pain mostly down the left leg. Has started Gabapentin 100 TID with no relief and celecoxib approximately two weeks ago with no noticeable effect. Request Tramadol.  Denies saddle paresthesia, urinary incontinence, or bowel incontinence. Does not endorse any other symptoms.      ROS Review of Systems  Constitutional: Negative for chills, fever and malaise/fatigue.  Eyes: Negative for blurred vision.  Respiratory: Negative for shortness of breath.   Cardiovascular: Negative for chest pain and palpitations.  Gastrointestinal: Negative for abdominal pain and nausea.  Genitourinary: Negative for dysuria and hematuria.  Musculoskeletal: Positive for back pain and neck pain. Negative for joint pain and myalgias.  Skin: Negative for rash.  Neurological: Negative for tingling and headaches.  Psychiatric/Behavioral: Negative for depression. The patient is not nervous/anxious.     Objective:  BP (!) 147/79 (BP Location: Left Arm, Patient Position: Sitting, Cuff Size: Normal)   Pulse 63   Temp 97.6 F (36.4 C) (Oral)   Wt 161 lb 6.4 oz (73.2 kg)   SpO2 99%   BMI 21.89 kg/m   BP/Weight 10/10/2016 9/93/7169 10/10/8936  Systolic BP 101 751 025  Diastolic BP 79 71 75  Wt. (Lbs) 161.4 160.2 161  BMI 21.89 21.73 20.67       Physical Exam  Constitutional: He is oriented to person, place, and time.  Thin, well groomed, NAD, polite, uses cane for ambulation  HENT:  Head: Normocephalic and atraumatic.  Eyes: No scleral icterus.  Neck: Normal range of motion.  Cardiovascular: Normal rate, regular rhythm and normal heart sounds.   Pulmonary/Chest: Effort normal and breath sounds normal.  Musculoskeletal: He exhibits no edema.  Neurological: He is alert and oriented to person, place, and time. No cranial nerve deficit. Coordination normal.  Skin: Skin is warm and dry. No rash noted. No erythema. No pallor.  Psychiatric: He has a normal mood and affect. His behavior is normal. Thought content normal.  Vitals reviewed.    Assessment & Plan:   1. Cervicalgia - Begin traMADol (ULTRAM) 50 MG tablet; Take 1 tablet (50 mg total) by mouth 3 (three) times daily.  Dispense: 90 tablet; Refill: 0 - Refill celecoxib (CELEBREX) 100 MG capsule; Take 1 capsule (100 mg total) by mouth 2 (two) times daily.  Dispense: 60 capsule; Refill: 5 - Increase gabapentin (NEURONTIN) 300 MG capsule; Take 1 capsule (300 mg total) by mouth 3 (three) times daily.  Dispense: 90 capsule; Refill: 3  2. Chronic midline low back pain with left-sided sciatica - Begin traMADol (ULTRAM) 50 MG tablet; Take 1 tablet (50 mg total) by mouth 3 (three) times daily.  Dispense: 90 tablet; Refill: 0 - Refill celecoxib (CELEBREX) 100 MG capsule; Take 1 capsule (100 mg total) by mouth 2 (two) times daily.  Dispense: 60 capsule; Refill:  5 - Increase gabapentin (NEURONTIN) 300 MG capsule; Take 1 capsule (300 mg total) by mouth 3 (three) times daily.  Dispense: 90 capsule; Refill: 3   Meds ordered this encounter  Medications  . traMADol (ULTRAM) 50 MG tablet    Sig: Take 1 tablet (50 mg total) by mouth 3 (three) times daily.    Dispense:  90 tablet    Refill:  0    Order Specific Question:   Supervising Provider    Answer:   Tresa Garter  [id number]  . celecoxib (CELEBREX) 100 MG capsule    Sig: Take 1 capsule (100 mg total) by mouth 2 (two) times daily.    Dispense:  60 capsule    Refill:  5    Order Specific Question:   Supervising Provider    Answer:   Tresa Garter [id number]  . gabapentin (NEURONTIN) 300 MG capsule    Sig: Take 1 capsule (300 mg total) by mouth 3 (three) times daily.    Dispense:  90 capsule    Refill:  3    Order Specific Question:   Supervising Provider    Answer:   Tresa Garter [[id number]]    Follow-up: Return in about 4 weeks (around 11/07/2016) for full physical.   Clent Demark PA

## 2016-10-10 NOTE — Patient Instructions (Signed)
Sciatica Sciatica is pain, numbness, weakness, or tingling along the path of the sciatic nerve. The sciatic nerve starts in the lower back and runs down the back of each leg. The nerve controls the muscles in the lower leg and in the back of the knee. It also provides feeling (sensation) to the back of the thigh, the lower leg, and the sole of the foot. Sciatica is a symptom of another medical condition that pinches or puts pressure on the sciatic nerve. Generally, sciatica only affects one side of the body. Sciatica usually goes away on its own or with treatment. In some cases, sciatica may keep coming back (recur). What are the causes? This condition is caused by pressure on the sciatic nerve, or pinching of the sciatic nerve. This may be the result of:  A disk in between the bones of the spine (vertebrae) bulging out too far (herniated disk).  Age-related changes in the spinal disks (degenerative disk disease).  A pain disorder that affects a muscle in the buttock (piriformis syndrome).  Extra bone growth (bone spur) near the sciatic nerve.  An injury or break (fracture) of the pelvis.  Pregnancy.  Tumor (rare). What increases the risk? The following factors may make you more likely to develop this condition:  Playing sports that place pressure or stress on the spine, such as football or weight lifting.  Having poor strength and flexibility.  A history of back injury.  A history of back surgery.  Sitting for long periods of time.  Doing activities that involve repetitive bending or lifting.  Obesity. What are the signs or symptoms? Symptoms can vary from mild to very severe, and they may include:  Any of these problems in the lower back, leg, hip, or buttock:  Mild tingling or dull aches.  Burning sensations.  Sharp pains.  Numbness in the back of the calf or the sole of the foot.  Leg weakness.  Severe back pain that makes movement difficult. These symptoms may  get worse when you cough, sneeze, or laugh, or when you sit or stand for long periods of time. Being overweight may also make symptoms worse. In some cases, symptoms may recur over time. How is this diagnosed? This condition may be diagnosed based on:  Your symptoms.  A physical exam. Your health care provider may ask you to do certain movements to check whether those movements trigger your symptoms.  You may have tests, including:  Blood tests.  X-rays.  MRI.  CT scan. How is this treated? In many cases, this condition improves on its own, without any treatment. However, treatment may include:  Reducing or modifying physical activity during periods of pain.  Exercising and stretching to strengthen your abdomen and improve the flexibility of your spine.  Icing and applying heat to the affected area.  Medicines that help:  To relieve pain and swelling.  To relax your muscles.  Injections of medicines that help to relieve pain, irritation, and inflammation around the sciatic nerve (steroids).  Surgery. Follow these instructions at home: Medicines   Take over-the-counter and prescription medicines only as told by your health care provider.  Do not drive or operate heavy machinery while taking prescription pain medicine. Managing pain   If directed, apply ice to the affected area.  Put ice in a plastic bag.  Place a towel between your skin and the bag.  Leave the ice on for 20 minutes, 2-3 times a day.  After icing, apply heat to the   affected area before you exercise or as often as told by your health care provider. Use the heat source that your health care provider recommends, such as a moist heat pack or a heating pad.  Place a towel between your skin and the heat source.  Leave the heat on for 20-30 minutes.  Remove the heat if your skin turns bright red. This is especially important if you are unable to feel pain, heat, or cold. You may have a greater risk of  getting burned. Activity   Return to your normal activities as told by your health care provider. Ask your health care provider what activities are safe for you.  Avoid activities that make your symptoms worse.  Take brief periods of rest throughout the day. Resting in a lying or standing position is usually better than sitting to rest.  When you rest for longer periods, mix in some mild activity or stretching between periods of rest. This will help to prevent stiffness and pain.  Avoid sitting for long periods of time without moving. Get up and move around at least one time each hour.  Exercise and stretch regularly, as told by your health care provider.  Do not lift anything that is heavier than 10 lb (4.5 kg) while you have symptoms of sciatica. When you do not have symptoms, you should still avoid heavy lifting, especially repetitive heavy lifting.  When you lift objects, always use proper lifting technique, which includes:  Bending your knees.  Keeping the load close to your body.  Avoiding twisting. General instructions   Use good posture.  Avoid leaning forward while sitting.  Avoid hunching over while standing.  Maintain a healthy weight. Excess weight puts extra stress on your back and makes it difficult to maintain good posture.  Wear supportive, comfortable shoes. Avoid wearing high heels.  Avoid sleeping on a mattress that is too soft or too hard. A mattress that is firm enough to support your back when you sleep may help to reduce your pain.  Keep all follow-up visits as told by your health care provider. This is important. Contact a health care provider if:  You have pain that wakes you up when you are sleeping.  You have pain that gets worse when you lie down.  Your pain is worse than you have experienced in the past.  Your pain lasts longer than 4 weeks.  You experience unexplained weight loss. Get help right away if:  You lose control of your bowel  or bladder (incontinence).  You have:  Weakness in your lower back, pelvis, buttocks, or legs that gets worse.  Redness or swelling of your back.  A burning sensation when you urinate. This information is not intended to replace advice given to you by your health care provider. Make sure you discuss any questions you have with your health care provider. Document Released: 04/16/2001 Document Revised: 09/26/2015 Document Reviewed: 12/30/2014 Elsevier Interactive Patient Education  2017 [employer]  

## 2016-10-18 ENCOUNTER — Ambulatory Visit (INDEPENDENT_AMBULATORY_CARE_PROVIDER_SITE_OTHER): Payer: Medicare Other | Admitting: Physician Assistant

## 2016-11-04 ENCOUNTER — Other Ambulatory Visit: Payer: Self-pay | Admitting: Family Medicine

## 2016-11-04 DIAGNOSIS — I10 Essential (primary) hypertension: Secondary | ICD-10-CM

## 2016-11-04 DIAGNOSIS — R399 Unspecified symptoms and signs involving the genitourinary system: Secondary | ICD-10-CM

## 2016-11-04 MED ORDER — AMLODIPINE BESYLATE 5 MG PO TABS: 5.0000 mg | ORAL_TABLET | Freq: Every day | ORAL | 1 refills | Status: DC

## 2016-11-04 MED ORDER — TADALAFIL 5 MG PO TABS: 5.0000 mg | ORAL_TABLET | Freq: Every day | ORAL | 2 refills | Status: DC

## 2016-11-04 MED ORDER — TAMSULOSIN HCL 0.4 MG PO CAPS: 0.4000 mg | ORAL_CAPSULE | Freq: Every day | ORAL | 3 refills | Status: DC

## 2016-11-04 NOTE — Progress Notes (Signed)
Meds ordered this encounter  Medications  . tadalafil (CIALIS) 5 MG tablet    Sig: Take 1 tablet (5 mg total) by mouth daily.    Dispense:  30 tablet    Refill:  2  . tamsulosin (FLOMAX) 0.4 MG CAPS capsule    Sig: Take 1 capsule (0.4 mg total) by mouth daily.    Dispense:  30 capsule    Refill:  3  . amLODipine (NORVASC) 5 MG tablet    Sig: Take 1 tablet (5 mg total) by mouth daily.    Dispense:  90 tablet    Refill:  [place]  MSN, FNP-C [facility] [address] [place], [place] [address] [phone]

## 2016-11-07 ENCOUNTER — Ambulatory Visit (INDEPENDENT_AMBULATORY_CARE_PROVIDER_SITE_OTHER): Payer: Medicare Other | Admitting: Physician Assistant

## 2016-11-07 ENCOUNTER — Encounter (INDEPENDENT_AMBULATORY_CARE_PROVIDER_SITE_OTHER): Payer: Self-pay | Admitting: Physician Assistant

## 2016-11-07 VITALS — BP 143/84 | HR 92 | Temp 98.0°F | Wt 163.0 lb

## 2016-11-07 DIAGNOSIS — M5442 Lumbago with sciatica, left side: Secondary | ICD-10-CM | POA: Diagnosis not present

## 2016-11-07 DIAGNOSIS — M542 Cervicalgia: Secondary | ICD-10-CM | POA: Diagnosis not present

## 2016-11-07 DIAGNOSIS — G8929 Other chronic pain: Secondary | ICD-10-CM

## 2016-11-07 DIAGNOSIS — R05 Cough: Secondary | ICD-10-CM | POA: Diagnosis not present

## 2016-11-07 DIAGNOSIS — I10 Essential (primary) hypertension: Secondary | ICD-10-CM

## 2016-11-07 DIAGNOSIS — R059 Cough, unspecified: Secondary | ICD-10-CM

## 2016-11-07 DIAGNOSIS — R0982 Postnasal drip: Secondary | ICD-10-CM | POA: Diagnosis not present

## 2016-11-07 MED ORDER — GUAIFENESIN ER 1200 MG PO TB12
1.0000 | ORAL_TABLET | Freq: Two times a day (BID) | ORAL | 0 refills | Status: AC
Start: 2016-11-07 — End: 2016-11-12

## 2016-11-07 MED ORDER — NAPROXEN 500 MG PO TBEC: 500.0000 mg | DELAYED_RELEASE_TABLET | Freq: Two times a day (BID) | ORAL | 0 refills | Status: DC

## 2016-11-07 MED ORDER — GABAPENTIN 300 MG PO CAPS: 600.0000 mg | ORAL_CAPSULE | Freq: Three times a day (TID) | ORAL | 5 refills | Status: DC

## 2016-11-07 MED ORDER — AZITHROMYCIN 500 MG PO TABS: 500.0000 mg | ORAL_TABLET | Freq: Every day | ORAL | 0 refills | Status: AC

## 2016-11-07 MED ORDER — CELECOXIB 200 MG PO CAPS: 200.0000 mg | ORAL_CAPSULE | Freq: Two times a day (BID) | ORAL | 5 refills | Status: DC

## 2016-11-07 MED ORDER — AMLODIPINE BESYLATE 10 MG PO TABS: 10.0000 mg | ORAL_TABLET | Freq: Every day | ORAL | 3 refills | Status: DC

## 2016-11-07 NOTE — Progress Notes (Signed)
Subjective:  Patient ID: Steven Lowery, male    DOB: 08/10/1945  Age: 71 y.o. MRN: [id number]  CC: annual exam  HPI KALEEL SCHMIEDER a 71 y.o.malewith a PMH of neck pain, LBP, BPH, IBS, CAD, MI, and alcohol abuse presents for f/u of cervicalgia, chronic LBP, HTN, and a new onset cough. Feels he is doing much better in regards to cervicalgia and LBP with Gabapentin and Celebrex. Requests a dose increase of both these medications to help him feel "100%". Reports no side effects to these medications.     Has a lingering cough of approximately three weeks. Thinks he may have fallen ill a few weeks ago but is feeling better now. Cough and congestion is also better now but still persisting and bothersome. Denies f/c/n/v, weakness, HA, chest pain, SOB, GI sxs, or rash.      Outpatient Medications Prior to Visit  Medication Sig Dispense Refill  . acetaminophen (TYLENOL) 325 MG tablet Take 2 tablets (650 mg total) by mouth every 6 (six) hours as needed for moderate pain. 60 tablet 0  . aspirin EC 81 MG tablet Take 1 tablet (81 mg total) by mouth daily. 90 tablet 3  . docusate sodium (COLACE) 100 MG capsule Take 1 capsule (100 mg total) by mouth daily. 30 capsule 0  . fish oil-omega-3 fatty acids 1000 MG capsule Take 1 g by mouth daily.     [name] lactulose (CHRONULAC) 10 GM/15ML solution Take 15 mLs (10 g total) by mouth daily as needed for mild constipation (constipation). 240 mL 0  . Multiple Vitamin (MULTIVITAMIN WITH MINERALS) TABS tablet Take 1 tablet by mouth daily.    [name] omeprazole (PRILOSEC) 20 MG capsule Take 1 capsule (20 mg total) by mouth daily. 30 capsule 5  . OVER THE COUNTER MEDICATION Place 2 drops into both eyes daily as needed (Eye burning). Reported on 09/11/2015    . senna-docusate (SENOKOT-S) 8.6-50 MG tablet Take 1 tablet by mouth 2 (two) times daily as needed for mild constipation or moderate constipation. 60 tablet 0  . sodium chloride (OCEAN) 0.65 % SOLN nasal spray Place 1 spray  into both nostrils as needed for congestion. 30 mL 0  . tadalafil (CIALIS) 5 MG tablet Take 1 tablet (5 mg total) by mouth daily. 30 tablet 2  . tamsulosin (FLOMAX) 0.4 MG CAPS capsule Take 1 capsule (0.4 mg total) by mouth daily. 30 capsule 3  . amLODipine (NORVASC) 5 MG tablet Take 1 tablet (5 mg total) by mouth daily. 90 tablet 1  . celecoxib (CELEBREX) 100 MG capsule Take 1 capsule (100 mg total) by mouth 2 (two) times daily. 60 capsule 5  . gabapentin (NEURONTIN) 300 MG capsule Take 1 capsule (300 mg total) by mouth 3 (three) times daily. 90 capsule 3  . traMADol (ULTRAM) 50 MG tablet Take 1 tablet (50 mg total) by mouth 3 (three) times daily. 90 tablet 0   Facility-Administered Medications Prior to Visit  Medication Dose Route Frequency Provider Last Rate Last Dose  . 0.9 %  sodium chloride infusion  500 mL Intravenous Continuous Ladene Artist, MD         ROS Review of Systems  Constitutional: Negative for chills, fever and malaise/fatigue.  HENT: Positive for congestion.   Eyes: Negative for blurred vision.  Respiratory: Positive for cough. Negative for shortness of breath.   Cardiovascular: Negative for chest pain and palpitations.  Gastrointestinal: Negative for abdominal pain and nausea.  Genitourinary: Negative for dysuria and hematuria.  Musculoskeletal: Positive for back pain and neck pain. Negative for joint pain and myalgias.  Skin: Negative for rash.  Neurological: Negative for tingling and headaches.  Psychiatric/Behavioral: Negative for depression. The patient is not nervous/anxious.     Objective:  BP (!) 143/84 (BP Location: Left Arm, Patient Position: Sitting, Cuff Size: Normal)   Pulse 92   Temp 98 F (36.7 C) (Oral)   Wt 163 lb (73.9 kg)   SpO2 99%   BMI 22.11 kg/m   BP/Weight 11/07/2016 10/10/2016 07/27/5571  Systolic BP 220 254 270  Diastolic BP 84 79 71  Wt. (Lbs) 163 161.4 160.2  BMI 22.11 21.89 21.73      Physical Exam  Constitutional: He is  oriented to person, place, and time.  Thin, well groomed, NAD, polite, uses cane for ambulation  HENT:  Head: Normocephalic and atraumatic.  Mouth/Throat: No oropharyngeal exudate.  Turbinates moderately hypertrophic and erythematous. TMs normal. Mild postnasal drip.  Eyes: No scleral icterus.  Neck: Normal range of motion. Neck supple.  Cardiovascular: Normal rate, regular rhythm and normal heart sounds.   Pulmonary/Chest: Effort normal and breath sounds normal.  Musculoskeletal: He exhibits no edema.  Lymphadenopathy:    He has no cervical adenopathy.  Neurological: He is alert and oriented to person, place, and time. No cranial nerve deficit. Coordination normal.  Skin: Skin is warm and dry. No rash noted. No erythema. No pallor.  Psychiatric: He has a normal mood and affect. His behavior is normal. Thought content normal.  Vitals reviewed.    Assessment & Plan:   1. Chronic midline low back pain with left-sided sciatica - Increase gabapentin (NEURONTIN) 300 MG capsule; Take 2 capsules (600 mg total) by mouth 3 (three) times daily.  Dispense: 180 capsule; Refill: 5 - Increase celecoxib (CELEBREX) 200 MG capsule; Take 1 capsule (200 mg total) by mouth 2 (two) times daily.  Dispense: 60 capsule; Refill: 5  2. Cervicalgia - Increase gabapentin (NEURONTIN) 300 MG capsule; Take 2 capsules (600 mg total) by mouth 3 (three) times daily.  Dispense: 180 capsule; Refill: 5 - Increase celecoxib (CELEBREX) 200 MG capsule; Take 1 capsule (200 mg total) by mouth 2 (two) times daily.  Dispense: 60 capsule; Refill: 5  3. Essential hypertension - Increase amLODipine (NORVASC) 10 MG tablet; Take 1 tablet (10 mg total) by mouth daily.  Dispense: 90 tablet; Refill: 3  4. Postnasal drip - Begin azithromycin (ZITHROMAX) 500 MG tablet; Take 1 tablet (500 mg total) by mouth daily.  Dispense: 3 tablet; Refill: 0 - Begin Guaifenesin (MUCINEX MAXIMUM STRENGTH) 1200 MG TB12; Take 1 tablet (1,200 mg total)  by mouth 2 (two) times daily.  Dispense: 10 tablet; Refill: 0  5. Cough - Begin azithromycin (ZITHROMAX) 500 MG tablet; Take 1 tablet (500 mg total) by mouth daily.  Dispense: 3 tablet; Refill: 0 - Begin Guaifenesin (MUCINEX MAXIMUM STRENGTH) 1200 MG TB12; Take 1 tablet (1,200 mg total) by mouth 2 (two) times daily.  Dispense: 10 tablet; Refill: 0  Meds ordered this encounter  Medications  . gabapentin (NEURONTIN) 300 MG capsule    Sig: Take 2 capsules (600 mg total) by mouth 3 (three) times daily.    Dispense:  180 capsule    Refill:  5    Order Specific Question:   Supervising Provider    Answer:   Tresa Garter [id number]  . celecoxib (CELEBREX) 200 MG capsule    Sig: Take 1 capsule (200 mg total) by mouth 2 (two) times daily.  Dispense:  60 capsule    Refill:  5    Order Specific Question:   Supervising Provider    Answer:   Tresa Garter [id number]  . amLODipine (NORVASC) 10 MG tablet    Sig: Take 1 tablet (10 mg total) by mouth daily.    Dispense:  90 tablet    Refill:  3    Order Specific Question:   Supervising Provider    Answer:   Tresa Garter [id number]  . azithromycin (ZITHROMAX) 500 MG tablet    Sig: Take 1 tablet (500 mg total) by mouth daily.    Dispense:  3 tablet    Refill:  0    Order Specific Question:   Supervising Provider    Answer:   Tresa Garter [id number]  . Guaifenesin (MUCINEX MAXIMUM STRENGTH) 1200 MG TB12    Sig: Take 1 tablet (1,200 mg total) by mouth 2 (two) times daily.    Dispense:  10 tablet    Refill:  0    Order Specific Question:   Supervising Provider    Answer:   Tresa Garter [id number]  . naproxen (EC NAPROSYN) 500 MG EC tablet    Sig: Take 1 tablet (500 mg total) by mouth 2 (two) times daily with a meal.    Dispense:  14 tablet    Refill:  0    Order Specific Question:   Supervising Provider    Answer:   Tresa Garter [[id number]]    Follow-up: PRN, Schedule to have full physical done in  future  Clent Demark PA

## 2016-11-07 NOTE — Progress Notes (Signed)
1. Cervicalgia - Begin celecoxib (CELEBREX) 100 MG capsule; Take 1 capsule (100 mg total) by mouth 2 (two) times daily.  Dispense: 60 capsule; Refill: 0 - Begin gabapentin (NEURONTIN) 100 MG capsule; Take 1 capsule (100 mg total) by mouth 3 (three) times daily.  Dispense: 90 capsule; Refill: 3  2. Chronic midline low back pain with left-sided sciatica - Begin celecoxib (CELEBREX) 100 MG capsule; Take 1 capsule (100 mg total) by mouth 2 (two) times daily.  Dispense: 60 capsule; Refill: 0 - Begin gabapentin (NEURONTIN) 100 MG capsule; Take 1 capsule (100 mg total) by mouth 3 (three) times daily.  Dispense: 90 capsule; Refill: 3       Subjective:  Patient ID: Steven Lowery, male    DOB: 11-05-1945  Age: 71 y.o. MRN: [id number]  CC: f/u HTN and DM  HPI Steven Lowery is a 71 y.o. male with a PMH of neck pain, LBP, BPH, IBS, CAD, MI, and alcohol abuse. He presents to establish care for his chronic neck pain and low back pain. CT C spine in 2016 revealed moderate degenerative changes.  MRI L spine 2004 revealed "rather impressive" degenerative disc disease at L5 - S1 with small central and bilateral paracentral disc herniation. Small disc protrusions at other levels. Mild to moderate stenosis of central canal and lateral recesses at mulitple levels, no severe stenosis. Patient has been to physical therapy with no long term benefit. He wears a back brace daily. Has episodes of sciatic pain mostly down the left leg. Denies saddle paresthesia, urinary incontinence, or bowel incontinence. Does not endorse any other symptoms.       Outpatient Medications Prior to Visit  Medication Sig Dispense Refill  . acetaminophen (TYLENOL) 325 MG tablet Take 2 tablets (650 mg total) by mouth every 6 (six) hours as needed for moderate pain. 60 tablet 0  . amLODipine (NORVASC) 5 MG tablet Take 1 tablet (5 mg total) by mouth daily. 90 tablet 1  . aspirin EC 81 MG tablet Take 1 tablet (81 mg total) by mouth  daily. 90 tablet 3  . celecoxib (CELEBREX) 100 MG capsule Take 1 capsule (100 mg total) by mouth 2 (two) times daily. 60 capsule 5  . docusate sodium (COLACE) 100 MG capsule Take 1 capsule (100 mg total) by mouth daily. 30 capsule 0  . fish oil-omega-3 fatty acids 1000 MG capsule Take 1 g by mouth daily.     [name] gabapentin (NEURONTIN) 300 MG capsule Take 1 capsule (300 mg total) by mouth 3 (three) times daily. 90 capsule 3  . lactulose (CHRONULAC) 10 GM/15ML solution Take 15 mLs (10 g total) by mouth daily as needed for mild constipation (constipation). 240 mL 0  . Multiple Vitamin (MULTIVITAMIN WITH MINERALS) TABS tablet Take 1 tablet by mouth daily.    [name] omeprazole (PRILOSEC) 20 MG capsule Take 1 capsule (20 mg total) by mouth daily. 30 capsule 5  . OVER THE COUNTER MEDICATION Place 2 drops into both eyes daily as needed (Eye burning). Reported on 09/11/2015    . senna-docusate (SENOKOT-S) 8.6-50 MG tablet Take 1 tablet by mouth 2 (two) times daily as needed for mild constipation or moderate constipation. 60 tablet 0  . sodium chloride (OCEAN) 0.65 % SOLN nasal spray Place 1 spray into both nostrils as needed for congestion. 30 mL 0  . tadalafil (CIALIS) 5 MG tablet Take 1 tablet (5 mg total) by mouth daily. 30 tablet 2  . tamsulosin (FLOMAX) 0.4 MG CAPS capsule  Take 1 capsule (0.4 mg total) by mouth daily. 30 capsule 3  . traMADol (ULTRAM) 50 MG tablet Take 1 tablet (50 mg total) by mouth 3 (three) times daily. 90 tablet 0   Facility-Administered Medications Prior to Visit  Medication Dose Route Frequency Provider Last Rate Last Dose  . 0.9 %  sodium chloride infusion  500 mL Intravenous Continuous Ladene Artist, MD         ROS ROS  Objective:  BP (!) 143/84 (BP Location: Left Arm, Patient Position: Sitting, Cuff Size: Normal)   Pulse 92   Temp 98 F (36.7 C) (Oral)   Wt 163 lb (73.9 kg)   SpO2 99%   BMI 22.11 kg/m   BP/Weight 11/07/2016 10/10/2016 1/63/8453  Systolic BP 646 803 212   Diastolic BP 84 79 71  Wt. (Lbs) 163 161.4 160.2  BMI 22.11 21.89 21.73      Physical Exam   Assessment & Plan:   There are no diagnoses linked to this encounter.  No orders of the defined types were placed in this encounter.   Follow-up: No Follow-up on file.   Clent Demark PA

## 2016-11-07 NOTE — Patient Instructions (Signed)
Cough, Adult Coughing is a reflex that clears your throat and your airways. Coughing helps to heal and protect your lungs. It is normal to cough occasionally, but a cough that happens with other symptoms or lasts a long time may be a sign of a condition that needs treatment. A cough may last only 2-3 weeks (acute), or it may last longer than 8 weeks (chronic). What are the causes? Coughing is commonly caused by:  Breathing in substances that irritate your lungs.  A viral or bacterial respiratory infection.  Allergies.  Asthma.  Postnasal drip.  Smoking.  Acid backing up from the stomach into the esophagus (gastroesophageal reflux).  Certain medicines.  Chronic lung problems, including COPD (or rarely, lung cancer).  Other medical conditions such as heart failure.  Follow these instructions at home: Pay attention to any changes in your symptoms. Take these actions to help with your discomfort:  Take medicines only as told by your health care provider. ? If you were prescribed an antibiotic medicine, take it as told by your health care provider. Do not stop taking the antibiotic even if you start to feel better. ? Talk with your health care provider before you take a cough suppressant medicine.  Drink enough fluid to keep your urine clear or pale yellow.  If the air is dry, use a cold steam vaporizer or humidifier in your bedroom or your home to help loosen secretions.  Avoid anything that causes you to cough at work or at home.  If your cough is worse at night, try sleeping in a semi-upright position.  Avoid cigarette smoke. If you smoke, quit smoking. If you need help quitting, ask your health care provider.  Avoid caffeine.  Avoid alcohol.  Rest as needed.  Contact a health care provider if:  You have new symptoms.  You cough up pus.  Your cough does not get better after 2-3 weeks, or your cough gets worse.  You cannot control your cough with suppressant  medicines and you are losing sleep.  You develop pain that is getting worse or pain that is not controlled with pain medicines.  You have a fever.  You have unexplained weight loss.  You have night sweats. Get help right away if:  You cough up blood.  You have difficulty breathing.  Your heartbeat is very fast. This information is not intended to replace advice given to you by your health care provider. Make sure you discuss any questions you have with your health care provider. Document Released: 10/19/2010 Document Revised: 09/28/2015 Document Reviewed: 06/29/2014 Elsevier Interactive Patient Education  2017 [employer]  

## 2016-12-06 ENCOUNTER — Ambulatory Visit (INDEPENDENT_AMBULATORY_CARE_PROVIDER_SITE_OTHER): Payer: Medicare Other | Admitting: Physician Assistant

## 2016-12-20 ENCOUNTER — Ambulatory Visit (INDEPENDENT_AMBULATORY_CARE_PROVIDER_SITE_OTHER): Payer: Medicare Other | Admitting: Physician Assistant

## 2017-02-04 ENCOUNTER — Ambulatory Visit: Payer: Medicare Other | Admitting: Family Medicine

## 2017-03-05 ENCOUNTER — Ambulatory Visit (INDEPENDENT_AMBULATORY_CARE_PROVIDER_SITE_OTHER): Payer: Medicare Other | Admitting: Physician Assistant

## 2017-06-01 IMAGING — DX DG CHEST 2V
2 series · 2 of 2 positions shown · non-contrast
Comparison: 06/03/2014

CLINICAL DATA: Left-sided chest pain tonight

EXAM:
CHEST  2 VIEW

[chest pa]
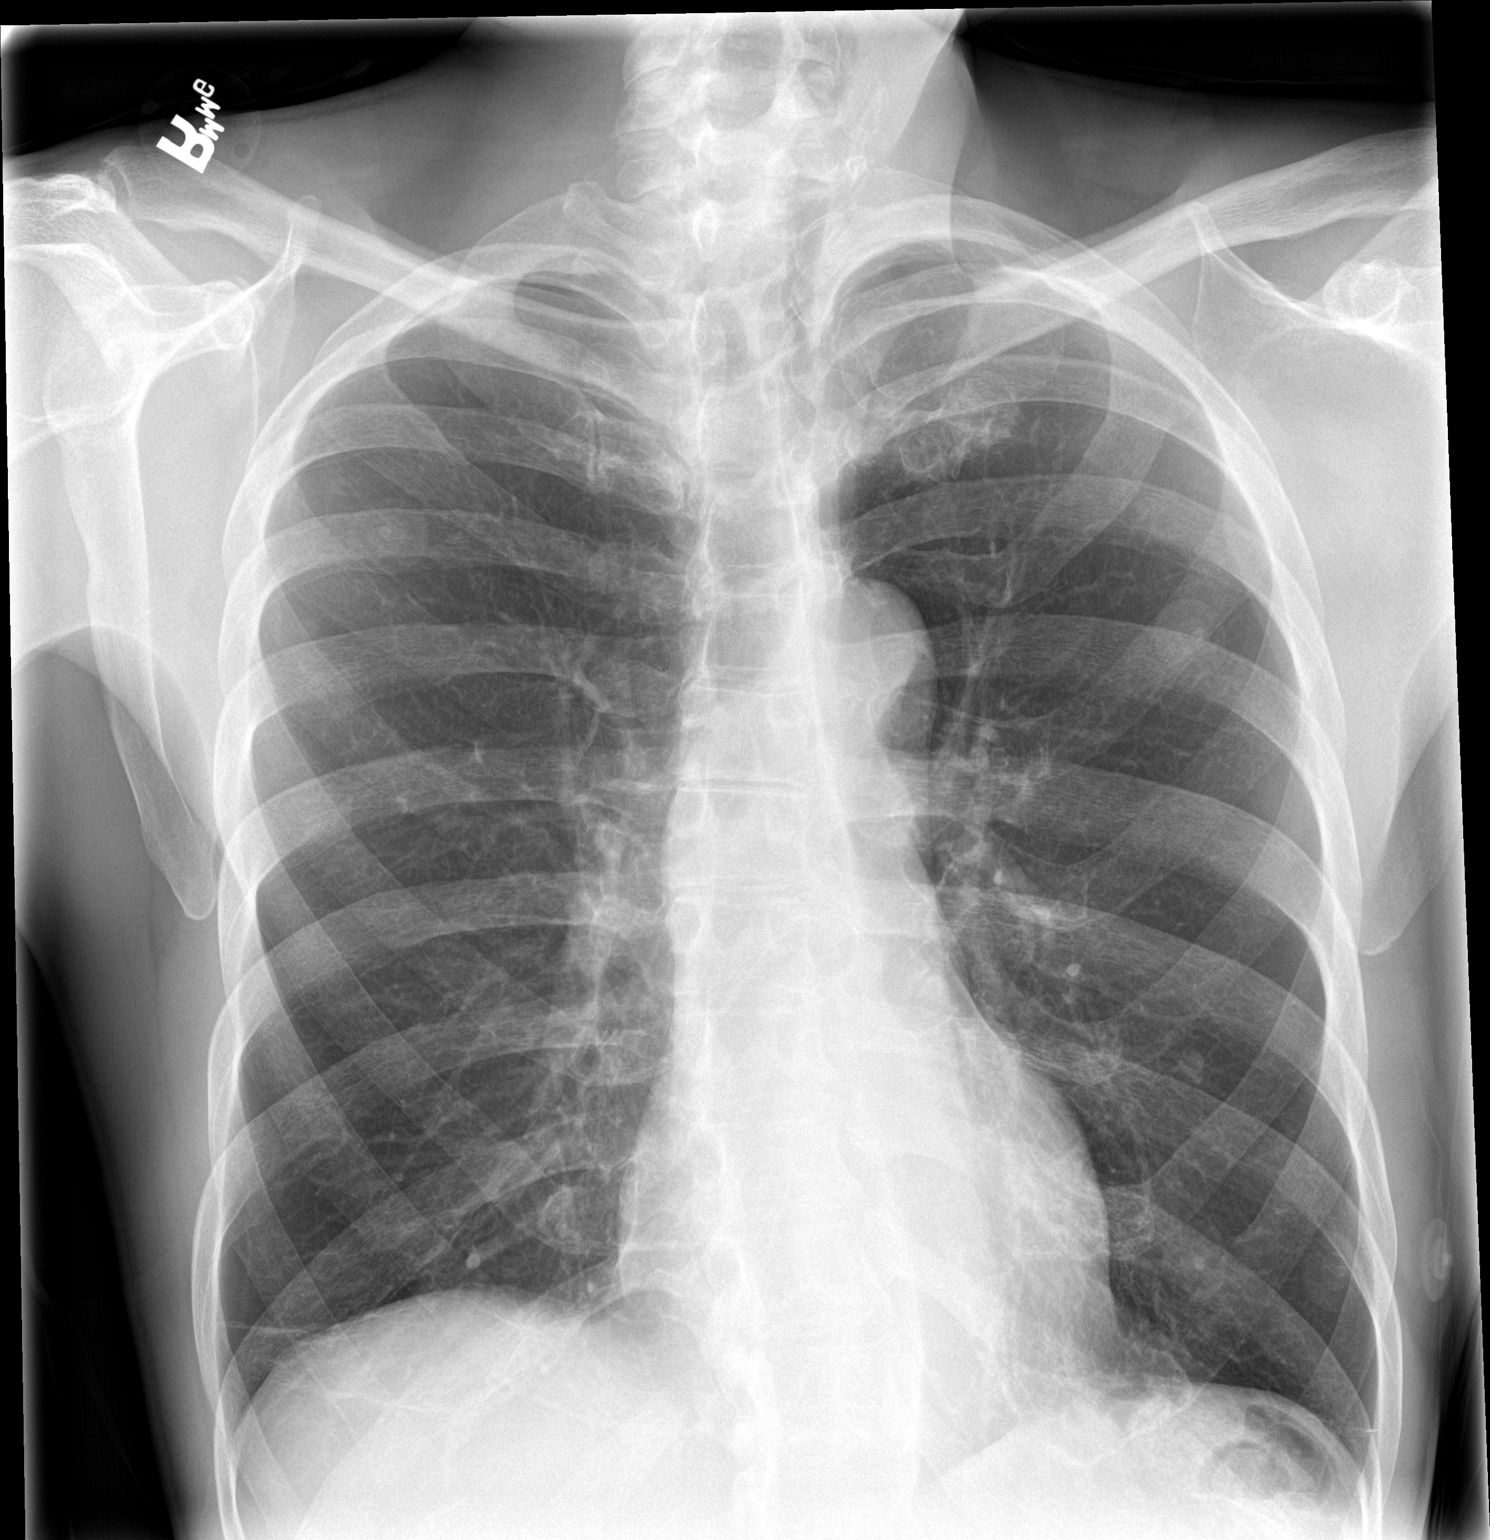

[chest lat]
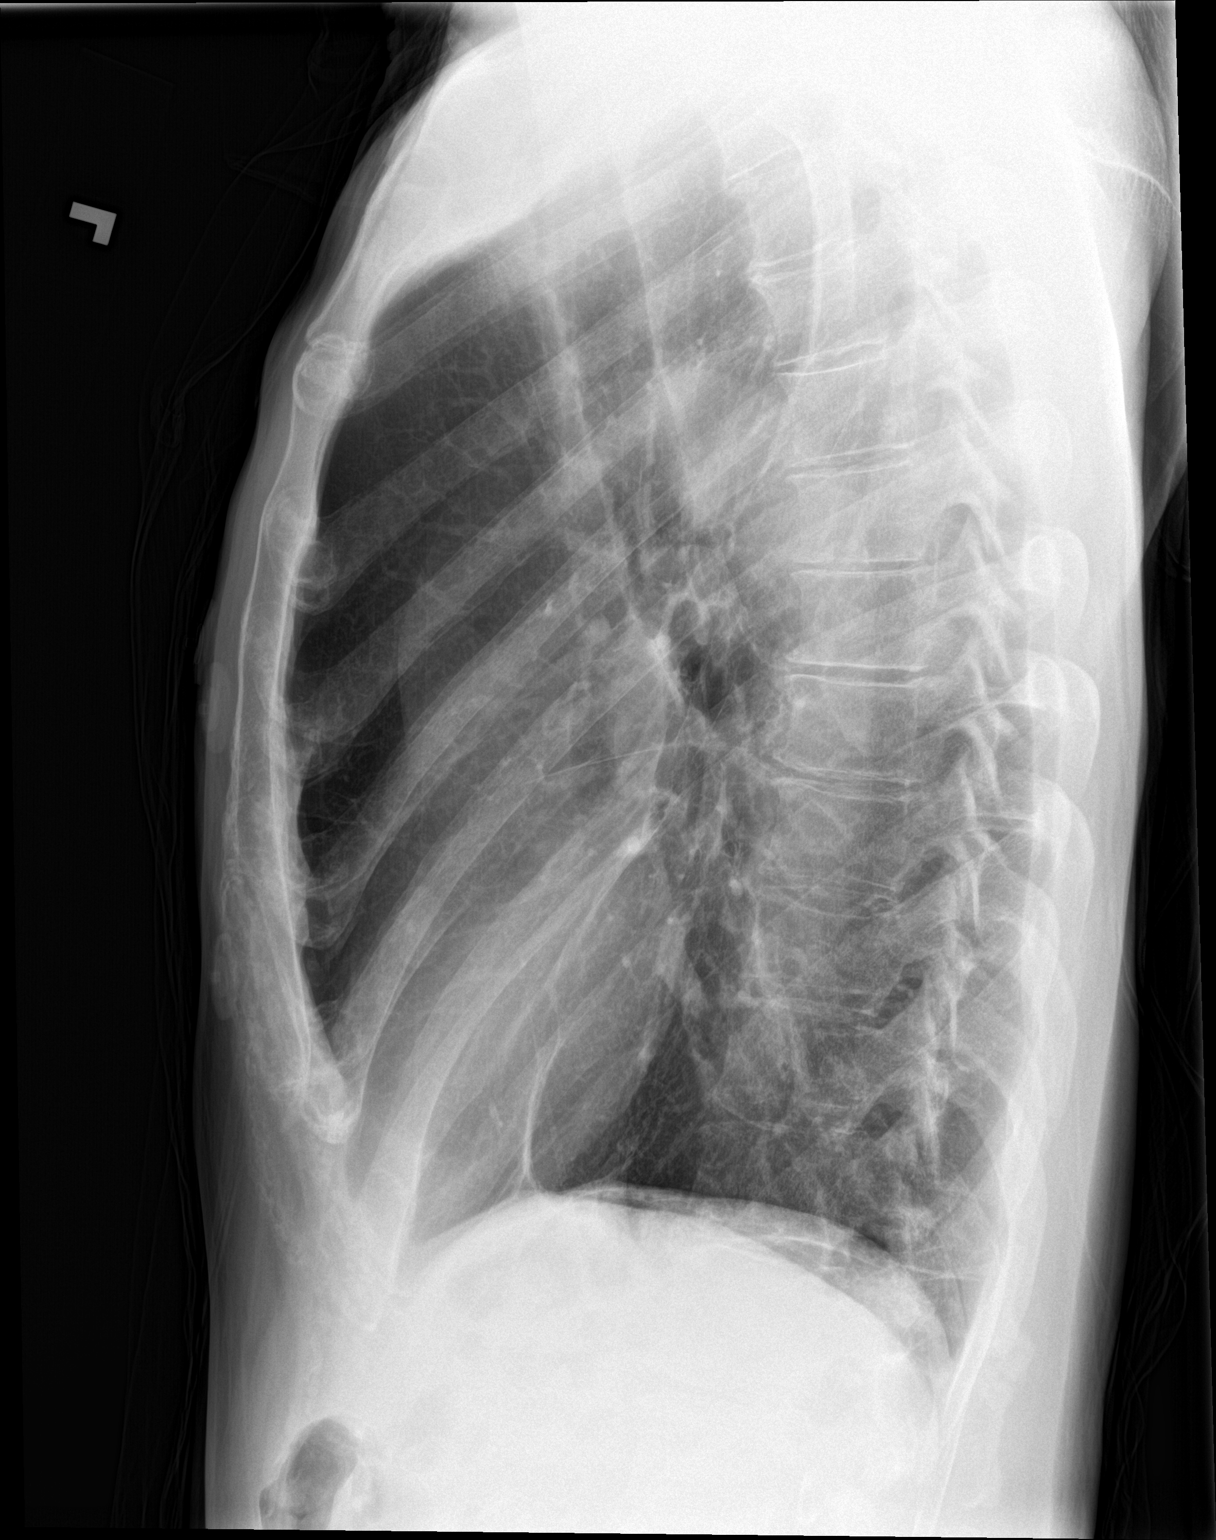

[2 of 2 positions shown; findings below may reference images not displayed]

FINDINGS: There is marked hyperinflation. Stable nodular opacity in the left
base likely represents nipple shadow. The lungs are otherwise clear.
The pulmonary vasculature is normal. Hilar, mediastinal and cardiac
contours appear unremarkable. There are no pleural effusions.
IMPRESSION: Marked hyperinflation.

## 2017-09-01 IMAGING — CT CT HEAD W/O CM
5 of 6 series · 16 of 47 positions shown, 17 images · non-contrast
Comparison: 08/06/2009 head CT.  04/20/2003 cervical spine MRI.

CLINICAL DATA: Fall with posterior head trauma, posterior headache
and left posterior neck pain.

EXAM:
CT HEAD WITHOUT CONTRAST
CT CERVICAL SPINE WITHOUT CONTRAST
TECHNIQUE: Multidetector CT imaging of the head and cervical spine was
performed following the standard protocol without intravenous
contrast. Multiplanar CT image reconstructions of the cervical spine
were also generated.

[Series 3: head without · axial · non-contrast · 0.44mm/px · z∈[-82,+63]mm · 3 of 30 slices shown, 4 images]
[im 1/30  brain]
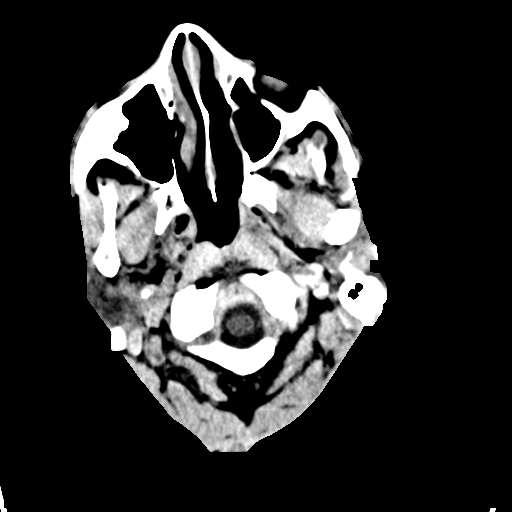
[im 1/30  bone]
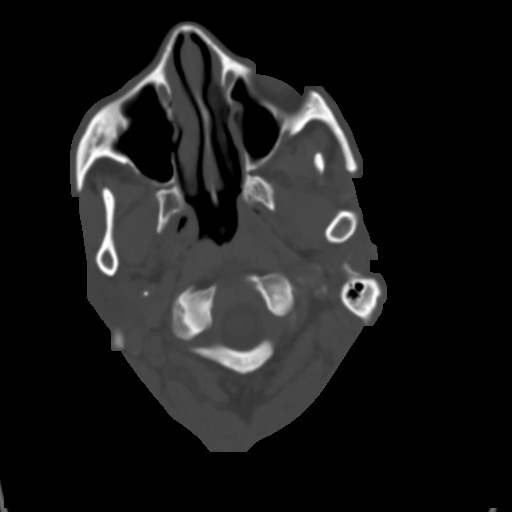
[im 15/30  brain]
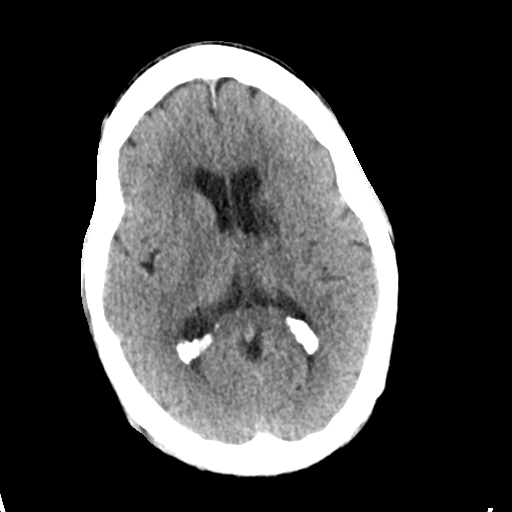
[im 30/30  brain]
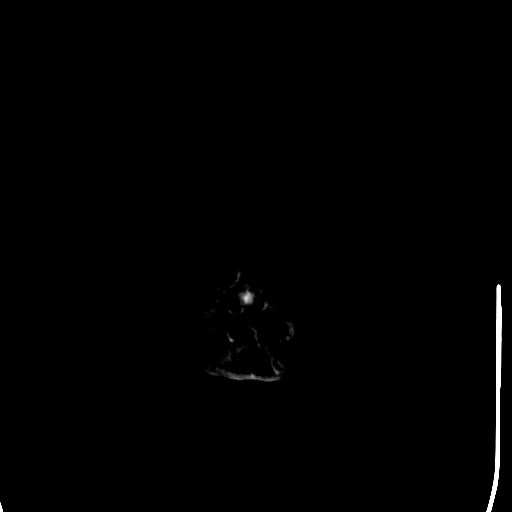

[Series 4: head bone · axial · 0.44mm/px · z∈[-58,+40]mm · 5 of 75 slices shown]
[im 13/75  bone]
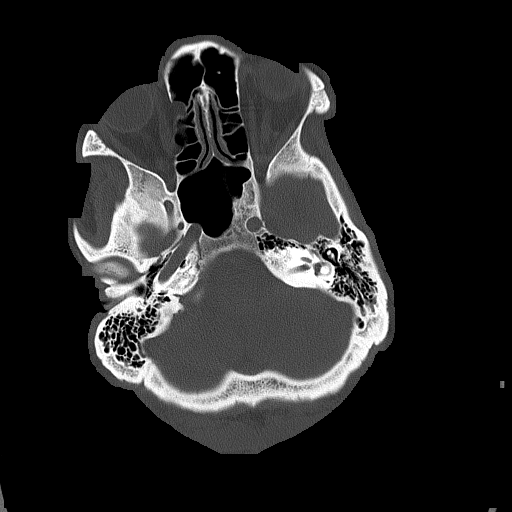
[im 25/75  bone]
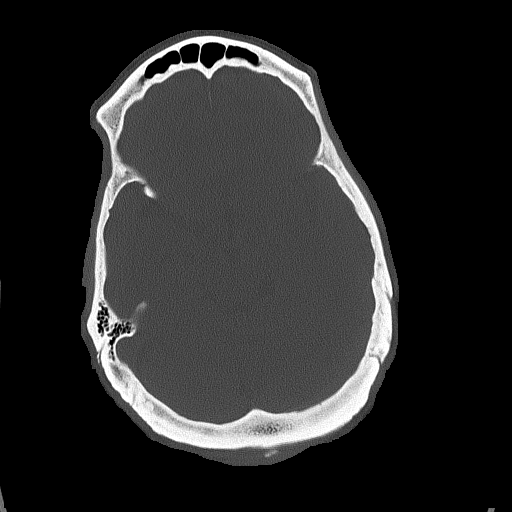
[im 38/75  bone]
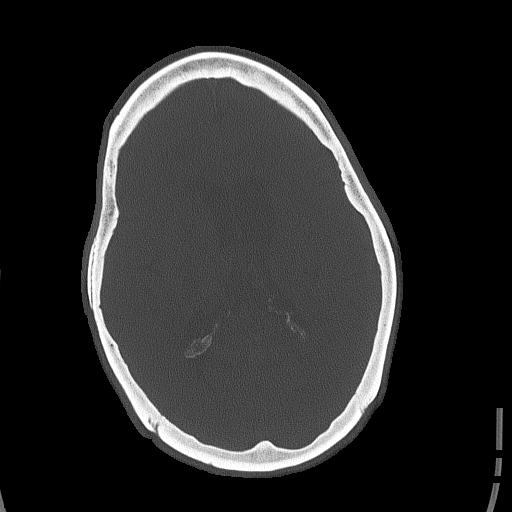
[im 50/75  bone]
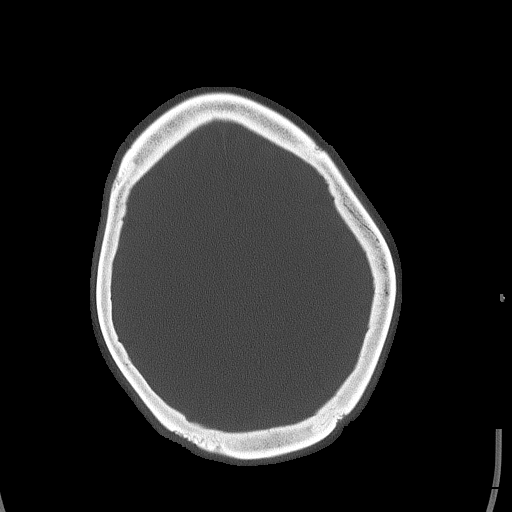
[im 62/75  bone]
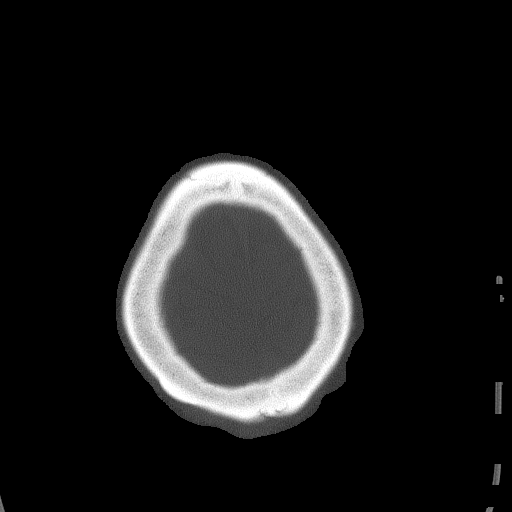

[Series 5: c_spine 2.0 st · axial · 0.33mm/px · z∈[-258,-234]mm · 2 of 111 slices shown]
[im 13/111  brain]
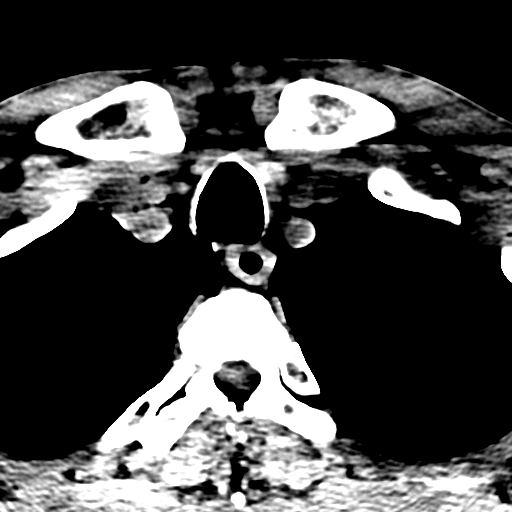
[im 25/111  brain]
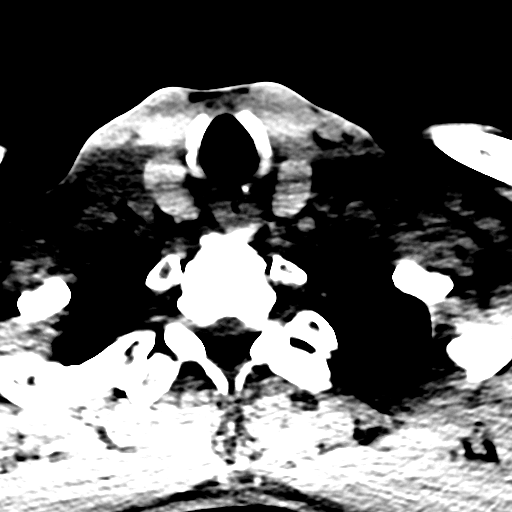

[Series 7: c_spine 2.0 sag bone · sagittal · 0.42mm/px · 3 of 59 slices shown]
[im 20/59  brain]
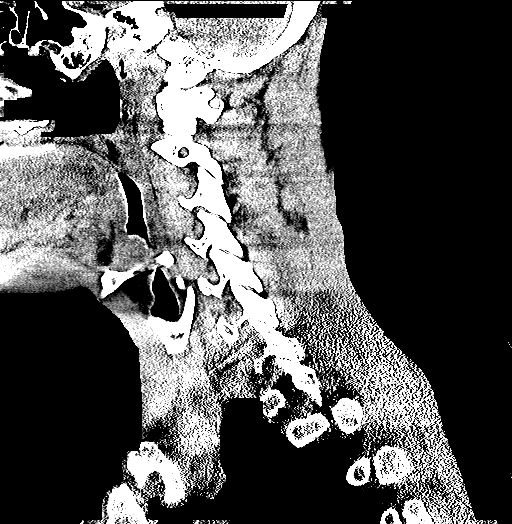
[im 30/59  brain]
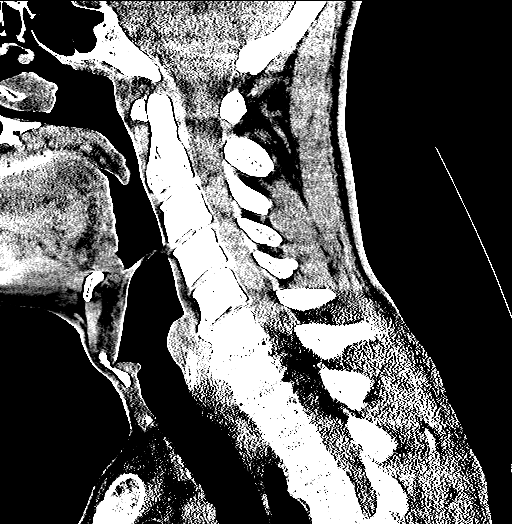
[im 39/59  brain]
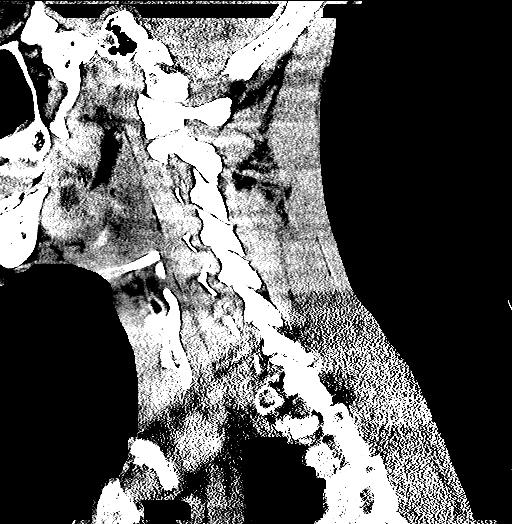

[Series 8: c_spine 2.0 cor bone · coronal · 0.32mm/px · 3 of 63 slices shown]
[im 21/63  brain]
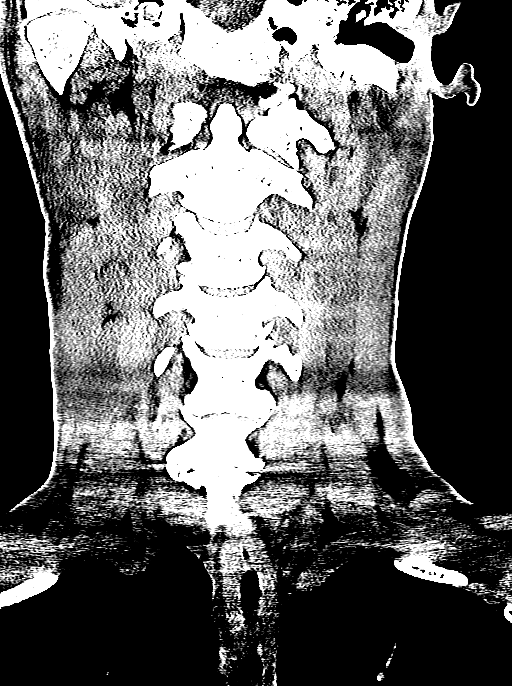
[im 28/63  brain]
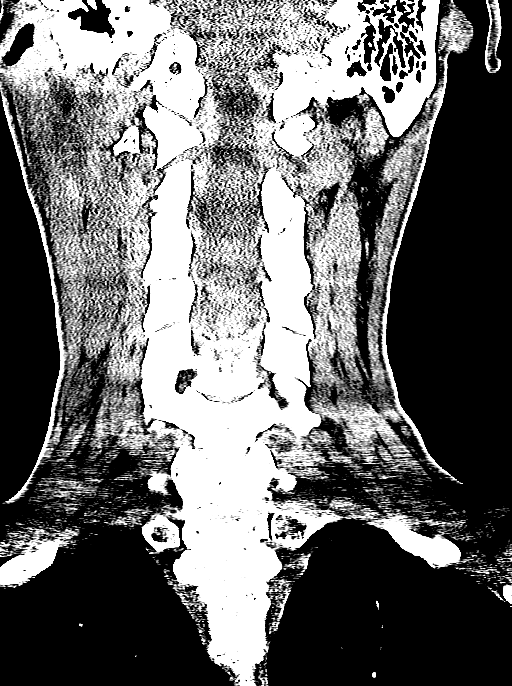
[im 35/63  brain]
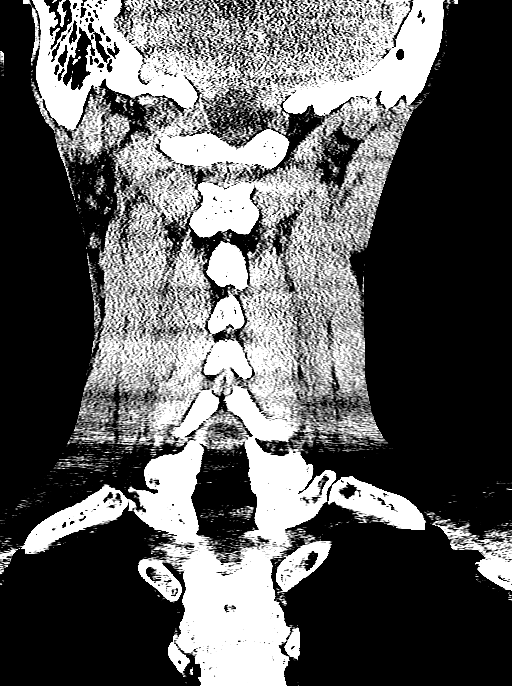

[16 of 47 positions shown; findings below may reference images not displayed]

FINDINGS: CT HEAD FINDINGS

No evidence of parenchymal hemorrhage or extra-axial fluid
collection. No mass lesion, mass effect, or midline shift.

No CT evidence of acute infarction. There is intracranial
atherosclerosis. Stable old lacunar infarct in the left basal
ganglia.

Cerebral volume is age appropriate. No ventriculomegaly.

The visualized paranasal sinuses are essentially clear. The mastoid
air cells are unopacified. No evidence of calvarial fracture.

CT CERVICAL SPINE FINDINGS

No fracture or subluxation is detected in the cervical spine. No
prevertebral soft tissue swelling. There is straightening of the
cervical spine, usually due to positioning and/or muscle spasm. Dens
is well positioned between the lateral masses of C1. The lateral
masses appear well-aligned. Moderate degenerative disc disease in
the mid to lower cervical spine, most prominent at C5-6 and C6-7.
Minimal facet arthropathy bilaterally in the mid to lower cervical
spine. Mild degenerative foraminal stenosis on the right head C3-4
and C4-5.

Visualized mastoid air cells appear clear. No evidence of
intra-axial hemorrhage in the visualized brain. No gross cervical
canal hematoma. Visualized lung apices are clear. No appreciable
cervical adenopathy or other cervical spine soft tissue abnormality.
IMPRESSION: 1. No evidence of acute intracranial abnormality. No calvarial
fracture.
2. No fracture or subluxation in the cervical spine.
3. Intracranial atherosclerosis. Stable old left basal ganglia
lacunar infarct.
4. Moderate degenerative changes in the cervical spine as described
.

## 2017-11-30 ENCOUNTER — Encounter (HOSPITAL_COMMUNITY): Payer: Self-pay | Admitting: Emergency Medicine

## 2017-11-30 ENCOUNTER — Emergency Department (HOSPITAL_COMMUNITY)
Admission: EM | Admit: 2017-11-30 | Discharge: 2017-11-30 | Disposition: A | Discharge status: Home / Self Care | Payer: Medicare Other | Attending: Emergency Medicine | Admitting: Emergency Medicine

## 2017-11-30 ENCOUNTER — Other Ambulatory Visit: Payer: Self-pay

## 2017-11-30 DIAGNOSIS — F17210 Nicotine dependence, cigarettes, uncomplicated: Secondary | ICD-10-CM | POA: Diagnosis not present

## 2017-11-30 DIAGNOSIS — Z7982 Long term (current) use of aspirin: Secondary | ICD-10-CM | POA: Diagnosis not present

## 2017-11-30 DIAGNOSIS — Z79899 Other long term (current) drug therapy: Secondary | ICD-10-CM | POA: Insufficient documentation

## 2017-11-30 DIAGNOSIS — I2510 Atherosclerotic heart disease of native coronary artery without angina pectoris: Secondary | ICD-10-CM | POA: Diagnosis not present

## 2017-11-30 DIAGNOSIS — H1032 Unspecified acute conjunctivitis, left eye: Secondary | ICD-10-CM | POA: Diagnosis not present

## 2017-11-30 DIAGNOSIS — I10 Essential (primary) hypertension: Secondary | ICD-10-CM | POA: Insufficient documentation

## 2017-11-30 DIAGNOSIS — H5712 Ocular pain, left eye: Secondary | ICD-10-CM | POA: Diagnosis present

## 2017-11-30 MED ORDER — ERYTHROMYCIN 5 MG/GM OP OINT
1.0000 "application " | TOPICAL_OINTMENT | Freq: Once | OPHTHALMIC | Status: AC
  Administered 2017-11-30: 1 via OPHTHALMIC
  Filled 2017-11-30: qty 3.5

## 2017-11-30 MED ORDER — FLUORESCEIN SODIUM 1 MG OP STRP: 1.0000 | ORAL_STRIP | Freq: Once | OPHTHALMIC | Status: DC

## 2017-11-30 MED ORDER — TETRACAINE HCL 0.5 % OP SOLN: 2.0000 [drp] | Freq: Once | OPHTHALMIC | Status: DC

## 2017-11-30 NOTE — Discharge Instructions (Addendum)
Thank you for allowing me to care for you today in the Emergency Department.   Apply a 1 cm ribbon (approximately the width of your thumb) to the lower lashes of the left eye every 4 hours for the next 5 days.  This infection is contagious.  Make sure that you wash your hands with warm water and soap every time that you touch your eye or touch your face.  You can take at 650 mg of Tylenol every 6 hours for pain control.  Return to the emergency department if you develop a high fever, develop pain with moving your eyeball, or if your face gets red, hot, or swollen, or develop other new, concerning symptoms.

## 2017-11-30 NOTE — ED Notes (Signed)
Sandwich and Coffee given to patient.

## 2017-11-30 NOTE — ED Notes (Signed)
Pt verbalized understanding of discharge instructions and denies any further questions at this time.   

## 2017-11-30 NOTE — ED Provider Notes (Signed)
[facility] EMERGENCY DEPARTMENT Provider Note   CSN: [id number] Arrival date & time: 11/30/17  0907     History   Chief Complaint Chief Complaint  Patient presents with  . Eye Problem    HPI [facility] is a 72 y.o. male with a history of MI, IBS, HTN, BPH, and alcohol abuse who presents to the emergency department with a chief complaint of left eye pain and redness that began 1 week ago.  He states that the pain is scratchy, pruritic, and burning.  No known trauma or injury.  He reports yellow drainage from the eye for the last few days.  The eye has been matted shut the last few days when he has awoken.  He denies pain with movement of the eye, fever, chills, headache, facial swelling, or URI symptoms.  No treatment prior to arrival.  He reports that he thinks his grandchild also started having similar symptoms after they spent the day together several days ago.  He does not wear contacts.  The history is provided by the patient. No language interpreter was used.    Past Medical History:  Diagnosis Date  . Acute asthmatic bronchitis   . Alcohol abuse   . Back pain   . Benign prostatic hypertrophy   . Cigarette smoker   . Coronary artery disease   . DJD (degenerative joint disease)   . Hypertension   . IBS (irritable bowel syndrome)   . Myocardial infarction [facility])    per pt he thinks he had an MI "5 years ago"  . Neck pain   . Psychiatric disorder     Patient Active Problem List   Diagnosis Date Noted  . BPH (benign prostatic hyperplasia) 02/08/2015  . Decreased urine stream 02/08/2015  . 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 PSYCHIATRIC DISORDER 06/28/2008  . CIGARETTE SMOKER 06/28/2008  . Essential hypertension 06/28/2008  . CORONARY ARTERY DISEASE 06/28/2008  . ASTHMATIC BRONCHITIS, ACUTE 06/28/2008  . Osteoarthritis 06/28/2008  . NECK PAIN 06/28/2008  . Tobacco dependence 06/28/2008  . BACK PAIN 12/24/2007  . ALCOHOL ABUSE  05/12/2007  . IRRITABLE BOWEL SYNDROME, HX OF 05/12/2007    Past Surgical History:  Procedure Laterality Date  . CYSTOSCOPY  1999   Dr. Janice Norrie  . HAND SURGERY    . TONSILLECTOMY          Home Medications    Prior to Admission medications   Medication Sig Start Date End Date Taking? Authorizing Provider  acetaminophen (TYLENOL) 325 MG tablet Take 2 tablets (650 mg total) by mouth every 6 (six) hours as needed for moderate pain. 09/16/15   Waynetta Pean, PA-C  amLODipine (NORVASC) 10 MG tablet Take 1 tablet (10 mg total) by mouth daily. 11/07/16   Clent Demark, PA-C  aspirin EC 81 MG tablet Take 1 tablet (81 mg total) by mouth daily. 08/03/15   Dorena Dew, FNP  celecoxib (CELEBREX) 200 MG capsule Take 1 capsule (200 mg total) by mouth 2 (two) times daily. 11/07/16   Clent Demark, PA-C  docusate sodium (COLACE) 100 MG capsule Take 1 capsule (100 mg total) by mouth daily. 08/10/15   Dorena Dew, FNP  fish oil-omega-3 fatty acids 1000 MG capsule Take 1 g by mouth daily.     [provider]  gabapentin (NEURONTIN) 300 MG capsule Take 2 capsules (600 mg total) by mouth 3 (three) times daily. 11/07/16   Clent Demark, PA-C  lactulose [facility]) 10  GM/15ML solution Take 15 mLs (10 g total) by mouth daily as needed for mild constipation (constipation). 03/12/16   Isla Pence, MD  Multiple Vitamin (MULTIVITAMIN WITH MINERALS) TABS tablet Take 1 tablet by mouth daily.    [provider]  omeprazole (PRILOSEC) 20 MG capsule Take 1 capsule (20 mg total) by mouth daily. 08/05/16   Dorena Dew, FNP  OVER THE COUNTER MEDICATION Place 2 drops into both eyes daily as needed (Eye burning). Reported on 09/11/2015    [provider]  senna-docusate (SENOKOT-S) 8.6-50 MG tablet Take 1 tablet by mouth 2 (two) times daily as needed for mild constipation or moderate constipation. 09/29/15   Forde Dandy, MD  sodium chloride (OCEAN) 0.65 % SOLN nasal spray  Place 1 spray into both nostrils as needed for congestion. 06/03/14   Dahlia Bailiff, PA-C  tadalafil (CIALIS) 5 MG tablet Take 1 tablet (5 mg total) by mouth daily. 11/04/16   Dorena Dew, FNP  tamsulosin (FLOMAX) 0.4 MG CAPS capsule Take 1 capsule (0.4 mg total) by mouth daily. 11/04/16   Dorena Dew, FNP    Family History Family History  Problem Relation Age of Onset  . Breast cancer Sister   . Colon cancer Neg Hx   . Esophageal cancer Neg Hx   . Rectal cancer Neg Hx   . Stomach cancer Neg Hx     Social History Social History   Tobacco Use  . Smoking status: Current Some Day Smoker    Packs/day: 0.30    Years: 40.00    Pack years: 12.00    Types: Cigarettes  . Smokeless tobacco: Never Used  . Tobacco comment: has cut back alot per pt  Substance Use Topics  . Alcohol use: No    Comment: quit 3 months ago  . Drug use: No     Allergies   Lisinopril   Review of Systems Review of Systems  Constitutional: Negative for activity change, chills and fever.  HENT: Negative for congestion, ear pain, sinus pressure and sinus pain.   Eyes: Positive for pain, discharge and redness. Negative for photophobia and visual disturbance.  Respiratory: Negative for shortness of breath.   Cardiovascular: Negative for chest pain.  Gastrointestinal: Negative for abdominal pain.  Musculoskeletal: Negative for back pain.  Skin: Negative for rash.   Physical Exam Updated Vital Signs BP (!) 150/92 (BP Location: Right Arm)   Pulse 80   Temp 98.2 F (36.8 C) (Oral)   Resp 16   Ht 6\' 2"  (1.88 m)   Wt 79.4 kg (175 lb)   SpO2 97%   BMI 22.47 kg/m   Physical Exam  Constitutional: He appears well-developed.  Purulent yellow drainage noted to the lashes and medial canthus of the left eye.  No masses or lesions.  HENT:  Head: Normocephalic.  Right Ear: External ear normal.  Left Ear: External ear normal.  Nose: Nose normal.  Mouth/Throat: Oropharynx is clear and moist. No  oropharyngeal exudate.  Eyes: Pupils are equal, round, and reactive to light. EOM and lids are normal. Right conjunctiva is not injected. Right conjunctiva has no hemorrhage. Left conjunctiva is injected. Left conjunctiva has no hemorrhage.  Neck: Neck supple.  Cardiovascular: Normal rate and regular rhythm.  No murmur heard. Pulmonary/Chest: Effort normal.  Abdominal: Soft. He exhibits no distension.  Neurological: He is alert.  Skin: Skin is warm and dry.  Psychiatric: His behavior is normal.  Nursing note and vitals reviewed.  ED Treatments /  Results  Labs (all labs ordered are listed, but only abnormal results are displayed) Labs Reviewed - No data to display  EKG None  Radiology No results found.  Procedures Procedures (including critical care time)  Medications Ordered in ED Medications  erythromycin ophthalmic ointment 1 application (1 application Left Eye Given 11/30/17 1151)     Initial Impression / Assessment and Plan / ED Course  I have reviewed the triage vital signs and the nursing notes.  Pertinent labs & imaging results that were available during my care of the patient were reviewed by me and considered in my medical decision making (see chart for details).     72 year old male with a history of MI, IBS, HTN, BPH, and alcohol abuse presenting from home with left eye erythema, pain, and purulent drainage for the last week.  No change in visual acuity.  No red flags including pain with extraocular movements, headache, or constitutional symptoms.  Suspect bacterial conjunctivitis.  Patient was seen and evaluated along with Dr. Reather Converse, attending physician.  He does not wear contacts.  Doubt complicated sinusitis, preseptal or orbital cellulitis.  Will discharge the patient with erythromycin ointment and follow-up with ophthalmology if his symptoms do not improve.  Strict return precautions given.  He is hemodynamically stable in no acute distress.  He is safe for  discharge home with outpatient follow-up at this time.  Final Clinical Impressions(s) / ED Diagnoses   Final diagnoses:  Acute bacterial conjunctivitis of left eye    ED Discharge Orders    None       Joanne Gavel, PA-C 11/30/17 1153    Elnora Morrison, MD 12/01/17 [phone]

## 2017-11-30 NOTE — ED Triage Notes (Signed)
Pt. Stated, Donnald Garre had some left eye irritation for about a week.

## 2017-11-30 NOTE — ED Notes (Signed)
ED Provider at bedside. Dr. Zavitz 

## 2018-01-28 ENCOUNTER — Ambulatory Visit: Payer: Medicare Other | Admitting: Family Medicine

## 2018-02-02 ENCOUNTER — Encounter: Payer: Self-pay | Admitting: Family Medicine

## 2018-02-02 ENCOUNTER — Ambulatory Visit (INDEPENDENT_AMBULATORY_CARE_PROVIDER_SITE_OTHER): Payer: Medicare Other | Admitting: Family Medicine

## 2018-02-02 VITALS — BP 148/66 | HR 75 | Temp 97.6°F | Resp 16 | Ht 74.0 in | Wt 168.0 lb

## 2018-02-02 DIAGNOSIS — F17200 Nicotine dependence, unspecified, uncomplicated: Secondary | ICD-10-CM | POA: Diagnosis not present

## 2018-02-02 DIAGNOSIS — M5442 Lumbago with sciatica, left side: Secondary | ICD-10-CM

## 2018-02-02 DIAGNOSIS — N401 Enlarged prostate with lower urinary tract symptoms: Secondary | ICD-10-CM

## 2018-02-02 DIAGNOSIS — I10 Essential (primary) hypertension: Secondary | ICD-10-CM

## 2018-02-02 DIAGNOSIS — M5440 Lumbago with sciatica, unspecified side: Secondary | ICD-10-CM | POA: Diagnosis not present

## 2018-02-02 DIAGNOSIS — M542 Cervicalgia: Secondary | ICD-10-CM

## 2018-02-02 DIAGNOSIS — F489 Nonpsychotic mental disorder, unspecified: Secondary | ICD-10-CM

## 2018-02-02 DIAGNOSIS — R338 Other retention of urine: Secondary | ICD-10-CM

## 2018-02-02 DIAGNOSIS — G8929 Other chronic pain: Secondary | ICD-10-CM

## 2018-02-02 LAB — POCT URINALYSIS DIPSTICK
Bilirubin, UA: NEGATIVE
Glucose, UA: NEGATIVE
Ketones, UA: NEGATIVE
Leukocytes, UA: NEGATIVE
Nitrite, UA: NEGATIVE
Protein, UA: NEGATIVE
Spec Grav, UA: 1.015 (ref 1.010–1.025)
Urobilinogen, UA: 1 E.U./dL
pH, UA: 6 (ref 5.0–8.0)

## 2018-02-02 MED ORDER — TAMSULOSIN HCL 0.4 MG PO CAPS: 0.4000 mg | ORAL_CAPSULE | Freq: Every day | ORAL | 3 refills | Status: DC

## 2018-02-02 MED ORDER — AMLODIPINE BESYLATE 10 MG PO TABS: 10.0000 mg | ORAL_TABLET | Freq: Every day | ORAL | 3 refills | Status: DC
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
## 2018-02-02 NOTE — Patient Instructions (Signed)
Living With Bipolar Disorder If you have been diagnosed with bipolar disorder, you may be relieved that you now know why you have felt or behaved a certain way. You may also feel overwhelmed about the treatment ahead, how to get the support you need, and how to deal with the condition day-to-day. With care and support, you can learn to manage your symptoms and live with bipolar disorder. How to manage lifestyle changes Managing stress Stress is your body's reaction to life changes and events, both good and bad. Stress can play a major role in bipolar disorder, so it is important to learn how to cope with stress. Some techniques to cope with stress include:  Meditation, muscle relaxation, and breathing exercises.  Exercise. Even a short daily walk can help to lower stress levels.  Getting enough good-quality sleep. Too little sleep can cause mania to start (can trigger mania).  Making a schedule to manage your time. Knowing your daily schedule can help to keep you from feeling overwhelmed by tasks and deadlines.  Spending time on hobbies that you enjoy.  Medicines Your health care provider may suggest certain medicines if he or she feels that they will help improve your condition. Avoid using caffeine, alcohol, and other substances that may prevent your medicines from working properly (may interact). It is also important to:  Talk with your pharmacist or health care provider about all the medicines that you take, their possible side effects, and which medicines are safe to take together.  Make it your goal to take part in all treatment decisions (shared decision-making). Ask about possible side effects of medicines that your health care provider recommends, and tell him or her how you feel about having those side effects. It is best if shared decision-making with your health care provider is part of your total treatment plan.  If you are taking medicines as part of your treatment, do not stop  taking medicines before you ask your health care provider if it is safe to stop. You may need to have the medicine slowly decreased (tapered) over time to decrease the risk of harmful side effects. Relationships Spend time with people that you trust and with whom you feel a sense of understanding and calm. Try to find friends or family members who make you feel safe and can help you control feelings of mania. Consider going to couples counseling, family education classes, or family therapy to:  Educate your loved ones about your condition and offer suggestions about how they can support you.  Help resolve conflicts.  Help develop communication skills in your relationships.  How to recognize changes in your condition Everyone responds differently to treatment for bipolar disorder. Some signs that your condition is improving include:  Leveling of your mood. You may have less anger and excitement about daily activities, and your low moods may not be as bad.  Your symptoms being less intense.  Feeling calm more often.  Thinking clearly.  Not experiencing consequences for extreme behavior.  Feeling like your life is settling down.  Your behavior seeming more normal to you and to other people.  Some signs that your condition may be getting worse include:  Sleep problems.  Moods cycling between deep lows and unusually high (excess) energy.  Extreme emotions.  More anger at loved ones.  Staying away from others (isolating yourself).  A feeling of power or superiority.  Completing a lot of tasks in a very short amount of time.  Unusual thoughts and behaviors.  Suicidal thoughts.  Where to find support Talking to others  Try making a list of the people you may want to tell about your condition, such as the people you trust most.  Plan what you are willing to talk about and what you do not want to discuss. Think about your needs ahead of time, and how your friends and family  members can support you.  Let your loved ones know when they can share advice and when you would just like them to listen.  Give your loved ones information about bipolar disorder, and encourage them to learn about the condition. Finances Not all insurance plans cover mental health care, so it is important to check with your insurance carrier. If paying for co-pays or counseling services is a problem, search for a local or county mental health care center. Public mental health care services may be offered there at a low cost or no cost when you are not able to see a private health care provider. If you are taking medicine for depression, you may be able to get the generic form, which may be less expensive than brand-name medicine. Some makers of prescription medicines also offer help to patients who cannot afford the medicines they need. Follow these instructions at home: Medicines  Take over-the-counter and prescription medicines only as told by your health care provider or pharmacist.  Ask your pharmacist what over-the-counter cold medicines you should avoid. Some medicines can make symptoms worse. General instructions  Ask for support from trusted family members or friends to make sure you stay on track with your treatment.  Keep a journal to write down your daily moods, medicines, sleep habits, and life events. This may help you have more success with your treatment.  Make and follow a routine for daily meal times. Eat healthy foods, such as whole grains, vegetables, and fresh fruit.  Try to go to sleep and wake up around the same time every day.  Keep all follow-up visits as told by your health care provider. This is important. Questions to ask your health care provider:  If you are taking medicines: ? How long do I need to take medicine? ? Are there any long-term side effects of my medicine? ? Are there any alternatives to taking medicine?  How would I benefit from  therapy?  How often should I follow up with a health care provider? Contact a health care provider if:  Your symptoms get worse or they do not get better with treatment. Get help right away if:  You have thoughts about harming yourself or others. If you ever feel like you may hurt yourself or others, or have thoughts about taking your own life, get help right away. You can go to your nearest emergency department or call:  Your local emergency services (911 in the [place]  A suicide crisis helpline, such as the [place] at 407-299-3850. This is open 24-hours a day.  Summary  Learning ways to deal with stress can help to calm you and may also help your treatment work better.  There is a wide range of medicines that can help to treat bipolar disorder.  Having healthy relationships can help to make your moods more stable.  Contact a health care provider if your symptoms get worse or they do not get better with treatment. This information is not intended to replace advice given to you by your health care provider. Make sure you discuss any questions you have with your  health care provider. Document Released: 08/22/2016 Document Revised: 08/22/2016 Document Reviewed: 08/22/2016 Elsevier Interactive Patient Education  2018 [employer]. Hypertension Hypertension is another name for high blood pressure. High blood pressure forces your heart to work harder to pump blood. This can cause problems over time. There are two numbers in a blood pressure reading. There is a top number (systolic) over a bottom number (diastolic). It is best to have a blood pressure below 120/80. Healthy choices can help lower your blood pressure. You may need medicine to help lower your blood pressure if:  Your blood pressure cannot be lowered with healthy choices.  Your blood pressure is higher than 130/80.  Follow these instructions at home: Eating and drinking  If directed, follow  the DASH eating plan. This diet includes: ? Filling half of your plate at each meal with fruits and vegetables. ? Filling one quarter of your plate at each meal with whole grains. Whole grains include whole wheat pasta, brown rice, and whole grain bread. ? Eating or drinking low-fat dairy products, such as skim milk or low-fat yogurt. ? Filling one quarter of your plate at each meal with low-fat (lean) proteins. Low-fat proteins include fish, skinless chicken, eggs, beans, and tofu. ? Avoiding fatty meat, cured and processed meat, or chicken with skin. ? Avoiding premade or processed food.  Eat less than 1,500 mg of salt (sodium) a day.  Limit alcohol use to no more than 1 drink a day for nonpregnant women and 2 drinks a day for men. One drink equals 12 oz of beer, 5 oz of wine, or 1 oz of hard liquor. Lifestyle  Work with your doctor to stay at a healthy weight or to lose weight. Ask your doctor what the best weight is for you.  Get at least 30 minutes of exercise that causes your heart to beat faster (aerobic exercise) most days of the week. This may include walking, swimming, or biking.  Get at least 30 minutes of exercise that strengthens your muscles (resistance exercise) at least 3 days a week. This may include lifting weights or pilates.  Do not use any products that contain nicotine or tobacco. This includes cigarettes and e-cigarettes. If you need help quitting, ask your doctor.  Check your blood pressure at home as told by your doctor.  Keep all follow-up visits as told by your doctor. This is important. Medicines  Take over-the-counter and prescription medicines only as told by your doctor. Follow directions carefully.  Do not skip doses of blood pressure medicine. The medicine does not work as well if you skip doses. Skipping doses also puts you at risk for problems.  Ask your doctor about side effects or reactions to medicines that you should watch for. Contact a doctor  if:  You think you are having a reaction to the medicine you are taking.  You have headaches that keep coming back (recurring).  You feel dizzy.  You have swelling in your ankles.  You have trouble with your vision. Get help right away if:  You get a very bad headache.  You start to feel confused.  You feel weak or numb.  You feel faint.  You get very bad pain in your: ? Chest. ? Belly (abdomen).  You throw up (vomit) more than once.  You have trouble breathing. Summary  Hypertension is another name for high blood pressure.  Making healthy choices can help lower blood pressure. If your blood pressure cannot be controlled with healthy choices,  you may need to take medicine. This information is not intended to replace advice given to you by your health care provider. Make sure you discuss any questions you have with your health care provider. Document Released: 10/09/2007 Document Revised: 03/20/2016 Document Reviewed: 03/20/2016 Elsevier Interactive Patient Education  [employer].

## 2018-02-03 LAB — COMPREHENSIVE METABOLIC PANEL
ALT: 20 IU/L (ref 0–44)
AST: 24 IU/L (ref 0–40)
Albumin/Globulin Ratio: 1.9 (ref 1.2–2.2)
Albumin: 4.1 g/dL (ref 3.5–4.8)
Alkaline Phosphatase: 86 IU/L (ref 39–117)
BUN/Creatinine Ratio: 13 (ref 10–24)
BUN: 18 mg/dL (ref 8–27)
Bilirubin Total: 0.3 mg/dL (ref 0.0–1.2)
CO2: 26 mmol/L (ref 20–29)
Calcium: 9.6 mg/dL (ref 8.6–10.2)
Chloride: 102 mmol/L (ref 96–106)
Creatinine, Ser: 1.43 mg/dL — ABNORMAL HIGH (ref 0.76–1.27)
GFR calc Af Amer: 56 mL/min/{1.73_m2} — ABNORMAL LOW (ref >59)
GFR calc non Af Amer: 49 mL/min/{1.73_m2} — ABNORMAL LOW (ref >59)
Globulin, Total: 2.2 g/dL (ref 1.5–4.5)
Glucose: 84 mg/dL (ref 65–99)
Potassium: 4.5 mmol/L (ref 3.5–5.2)
Sodium: 141 mmol/L (ref 134–144)
Total Protein: 6.3 g/dL (ref 6.0–8.5)

## 2018-02-05 MED ORDER — CELECOXIB 200 MG PO CAPS: 200.0000 mg | ORAL_CAPSULE | Freq: Every day | ORAL | 5 refills | Status: DC

## 2018-02-05 MED ORDER — GABAPENTIN 300 MG PO CAPS: 300.0000 mg | ORAL_CAPSULE | Freq: Three times a day (TID) | ORAL | 5 refills | Status: DC

## 2018-05-07 ENCOUNTER — Ambulatory Visit: Payer: Medicare Other | Admitting: Family Medicine

## 2018-11-02 ENCOUNTER — Emergency Department (HOSPITAL_COMMUNITY)
Admission: EM | Admit: 2018-11-02 | Discharge: 2018-11-02 | Disposition: A | Discharge status: Home / Self Care | Payer: Medicare Other | Attending: Emergency Medicine | Admitting: Emergency Medicine

## 2018-11-02 ENCOUNTER — Encounter (HOSPITAL_COMMUNITY): Payer: Self-pay | Admitting: Emergency Medicine

## 2018-11-02 ENCOUNTER — Other Ambulatory Visit: Payer: Self-pay

## 2018-11-02 DIAGNOSIS — M79605 Pain in left leg: Secondary | ICD-10-CM | POA: Diagnosis present

## 2018-11-02 DIAGNOSIS — J449 Chronic obstructive pulmonary disease, unspecified: Secondary | ICD-10-CM | POA: Diagnosis not present

## 2018-11-02 DIAGNOSIS — F17210 Nicotine dependence, cigarettes, uncomplicated: Secondary | ICD-10-CM | POA: Insufficient documentation

## 2018-11-02 DIAGNOSIS — I2510 Atherosclerotic heart disease of native coronary artery without angina pectoris: Secondary | ICD-10-CM | POA: Insufficient documentation

## 2018-11-02 DIAGNOSIS — I10 Essential (primary) hypertension: Secondary | ICD-10-CM | POA: Insufficient documentation

## 2018-11-02 DIAGNOSIS — M5432 Sciatica, left side: Secondary | ICD-10-CM | POA: Diagnosis not present

## 2018-11-02 MED ORDER — KETOROLAC TROMETHAMINE 30 MG/ML IJ SOLN
30.0000 mg | Freq: Once | INTRAMUSCULAR | Status: AC
  Administered 2018-11-02: 30 mg via INTRAMUSCULAR
  Filled 2018-11-02: qty 1

## 2018-11-02 MED ORDER — LIDOCAINE 5 % EX OINT: 1.0000 "application " | TOPICAL_OINTMENT | Freq: Four times a day (QID) | CUTANEOUS | 0 refills | Status: AC | PRN

## 2018-11-02 MED ORDER — TIZANIDINE HCL 2 MG PO TABS: 2.0000 mg | ORAL_TABLET | Freq: Four times a day (QID) | ORAL | 0 refills | Status: DC | PRN

## 2018-11-02 NOTE — ED Provider Notes (Signed)
Emergency Department Provider Note   I have reviewed the triage vital signs and the nursing notes.   HISTORY  Chief Complaint Leg Pain   HPI Steven Lowery is a 73 y.o. male with past medical history listed below presents emergency department with left leg pain radiating into the left lower back.  Patient reports that the symptoms are very similar to his past episodes of sciatica.  He has had symptoms developed over the past 2 days.  He denies any weakness or numbness.  No falls.  No urinary incontinence, retention, fecal incontinence.  No fevers or chills.  Patient denies any injury of the left leg.  No swelling, chest pain, shortness of breath.  Patient states in the past he is taking oxycodone for this but did not have any this medication as a refill.  Patient states he does not want anything that will make him drowsy or altered.   Past Medical History:  Diagnosis Date  . Acute asthmatic bronchitis   . Alcohol abuse   . Back pain   . Benign prostatic hypertrophy   . Cigarette smoker   . Coronary artery disease   . DJD (degenerative joint disease)   . Hypertension   . IBS (irritable bowel syndrome)   . Myocardial infarction [facility])    per pt he thinks he had an MI "5 years ago"  . Neck pain   . Psychiatric disorder     Patient Active Problem List   Diagnosis Date Noted  . BPH (benign prostatic hyperplasia) 02/08/2015  . Decreased urine stream 02/08/2015  . 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 CIGARETTE SMOKER 06/28/2008  . Essential hypertension 06/28/2008  . CORONARY ARTERY DISEASE 06/28/2008  . ASTHMATIC BRONCHITIS, ACUTE 06/28/2008  . Osteoarthritis 06/28/2008  . NECK PAIN 06/28/2008  . Tobacco dependence 06/28/2008  . Backache 12/24/2007  . ALCOHOL ABUSE 05/12/2007  . IRRITABLE BOWEL SYNDROME, HX OF 05/12/2007    Past Surgical History:  Procedure Laterality Date  . CYSTOSCOPY  1999   Dr. Janice Norrie  . HAND SURGERY     . TONSILLECTOMY      Allergies Lisinopril  Family History  Problem Relation Age of Onset  . Breast cancer Sister   . Colon cancer Neg Hx   . Esophageal cancer Neg Hx   . Rectal cancer Neg Hx   . Stomach cancer Neg Hx     Social History Social History   Tobacco Use  . Smoking status: Current Some Day Smoker    Packs/day: 0.30    Years: 40.00    Pack years: 12.00    Types: Cigarettes  . Smokeless tobacco: Never Used  . Tobacco comment: has cut back alot per pt  Substance Use Topics  . Alcohol use: No    Comment: quit 3 months ago  . Drug use: No    Review of Systems  Constitutional: No fever/chills Eyes: No visual changes. ENT: No sore throat. Cardiovascular: Denies chest pain. Respiratory: Denies shortness of breath. Gastrointestinal: No abdominal pain.  No nausea, no vomiting.  No diarrhea.  No constipation. Genitourinary: Negative for dysuria. Musculoskeletal: Positive left lower back pain radiating to the left leg.  Skin: Negative for rash. Neurological: Negative for headaches, focal weakness or numbness.  10-point ROS otherwise negative.  ____________________________________________   PHYSICAL EXAM:  VITAL SIGNS: ED Triage Vitals  Enc Vitals Group     BP 11/02/18 0932 (!) 172/85     Pulse Rate  11/02/18 0932 82     Resp 11/02/18 0932 16     Temp 11/02/18 0932 98.2 F (36.8 C)     Temp Source 11/02/18 0932 Oral     SpO2 11/02/18 0932 98 %     Pain Score 11/02/18 0934 7   Constitutional: Alert and oriented. Well appearing and in no acute distress. Eyes: Conjunctivae are normal.  Head: Atraumatic. Nose: No congestion/rhinnorhea. Mouth/Throat: Mucous membranes are moist.  Neck: No stridor.   Cardiovascular: Normal rate, regular rhythm. Good peripheral circulation including DP/PT pulses. Grossly normal heart sounds.   Respiratory: Normal respiratory effort.  No retractions. Lungs CTAB. Gastrointestinal: Soft and nontender. No distention.   Musculoskeletal: No lower extremity tenderness nor edema. No gross deformities of extremities. Neurologic:  Normal speech and language. No gross focal neurologic deficits are appreciated.  Skin:  Skin is warm, dry and intact. No rash noted.  ____________________________________________  RADIOLOGY  None  ____________________________________________   PROCEDURES  Procedure(s) performed:   Procedures  None  ____________________________________________   INITIAL IMPRESSION / ASSESSMENT AND PLAN / ED COURSE  Pertinent labs & imaging results that were available during my care of the patient were reviewed by me and considered in my medical decision making (see chart for details).   Patient presents to the emergency department for evaluation of left leg and lower back pain radiating down the left leg.  Reports this feels the same as his prior sciatica symptoms.  Considered alternate diagnoses including vascular pathology and acute spine stenosis/cauda equina.  No physical exam findings or historical features to strongly suspect these diagnoses.  Patient with equal, palpable pulses in the lower extremities.  Normal sensation, strength in the bilateral lower extremities.  No flag signs or symptoms in terms of the patient's lower back pain.  No indication for emergency imaging at this time.  Plan to manage pain symptoms at home and have him follow-up with his primary care physician in the coming week.  If pain symptoms continue he may require additional outpatient imaging.  Discussed ED return precautions in detail.  Discussed that muscle relaxer might make him drowsy and he cannot drive or perform other important tasks while taking this medication.  Primarily advised topical pain medications. Patient verbalizes understanding.    ____________________________________________  FINAL CLINICAL IMPRESSION(S) / ED DIAGNOSES  Final diagnoses:  Left leg pain  Sciatica of left side      MEDICATIONS GIVEN DURING THIS VISIT:  Medications  ketorolac (TORADOL) 30 MG/ML injection 30 mg (30 mg Intramuscular Given 11/02/18 0956)     NEW OUTPATIENT MEDICATIONS STARTED DURING THIS VISIT:  New Prescriptions   LIDOCAINE (XYLOCAINE) 5 % OINTMENT    Apply 1 application topically 4 (four) times daily as needed.   TIZANIDINE (ZANAFLEX) 2 MG TABLET    Take 1 tablet (2 mg total) by mouth every 6 (six) hours as needed for muscle spasms.    Note:  This document was prepared using Dragon voice recognition software and may include unintentional dictation errors.  Nanda Quinton, MD Emergency Medicine    Long, Wonda Olds, MD 11/02/18 [phone]

## 2018-11-02 NOTE — ED Triage Notes (Signed)
Pt arrives to ED from home with complaints of left leg pain starting yesterday. Pt has hx of lower back pain with sciatica and is out of his medications.

## 2018-11-02 NOTE — Discharge Instructions (Signed)

## 2018-11-09 ENCOUNTER — Other Ambulatory Visit: Payer: Self-pay

## 2018-11-09 ENCOUNTER — Encounter (HOSPITAL_COMMUNITY): Payer: Self-pay | Admitting: *Deleted

## 2018-11-09 ENCOUNTER — Emergency Department (HOSPITAL_COMMUNITY)
Admission: EM | Admit: 2018-11-09 | Discharge: 2018-11-09 | Disposition: A | Discharge status: Home / Self Care | Payer: Medicare Other | Attending: Emergency Medicine | Admitting: Emergency Medicine

## 2018-11-09 ENCOUNTER — Emergency Department (HOSPITAL_COMMUNITY): Payer: Medicare Other

## 2018-11-09 DIAGNOSIS — I252 Old myocardial infarction: Secondary | ICD-10-CM | POA: Diagnosis not present

## 2018-11-09 DIAGNOSIS — M79605 Pain in left leg: Secondary | ICD-10-CM | POA: Insufficient documentation

## 2018-11-09 DIAGNOSIS — I10 Essential (primary) hypertension: Secondary | ICD-10-CM | POA: Insufficient documentation

## 2018-11-09 DIAGNOSIS — F17210 Nicotine dependence, cigarettes, uncomplicated: Secondary | ICD-10-CM | POA: Insufficient documentation

## 2018-11-09 DIAGNOSIS — J449 Chronic obstructive pulmonary disease, unspecified: Secondary | ICD-10-CM | POA: Insufficient documentation

## 2018-11-09 DIAGNOSIS — I2510 Atherosclerotic heart disease of native coronary artery without angina pectoris: Secondary | ICD-10-CM | POA: Diagnosis not present

## 2018-11-09 DIAGNOSIS — Z79899 Other long term (current) drug therapy: Secondary | ICD-10-CM | POA: Diagnosis not present

## 2018-11-09 DIAGNOSIS — M5432 Sciatica, left side: Secondary | ICD-10-CM | POA: Insufficient documentation

## 2018-11-09 LAB — CBC WITH DIFFERENTIAL/PLATELET
Abs Immature Granulocytes: 0 10*3/uL (ref 0.00–0.07)
Basophils Absolute: 0 10*3/uL (ref 0.0–0.1)
Basophils Relative: 1 %
Eosinophils Absolute: 0 10*3/uL (ref 0.0–0.5)
Eosinophils Relative: 1 %
HCT: 43.2 % (ref 39.0–52.0)
Hemoglobin: 14.7 g/dL (ref 13.0–17.0)
Immature Granulocytes: 0 %
Lymphocytes Relative: 33 %
Lymphs Abs: 1.3 10*3/uL (ref 0.7–4.0)
MCH: 30.6 pg (ref 26.0–34.0)
MCHC: 34 g/dL (ref 30.0–36.0)
MCV: 90 fL (ref 80.0–100.0)
Monocytes Absolute: 0.3 10*3/uL (ref 0.1–1.0)
Monocytes Relative: 7 %
Neutro Abs: 2.3 10*3/uL (ref 1.7–7.7)
Neutrophils Relative %: 58 %
Platelets: 213 10*3/uL (ref 150–400)
RBC: 4.8 MIL/uL (ref 4.22–5.81)
RDW: 13.5 % (ref 11.5–15.5)
WBC: 3.9 10*3/uL — ABNORMAL LOW (ref 4.0–10.5)
nRBC: 0 % (ref 0.0–0.2)

## 2018-11-09 LAB — BASIC METABOLIC PANEL
Anion gap: 9 (ref 5–15)
BUN: 14 mg/dL (ref 8–23)
CO2: 25 mmol/L (ref 22–32)
Calcium: 9.6 mg/dL (ref 8.9–10.3)
Chloride: 106 mmol/L (ref 98–111)
Creatinine, Ser: 1.1 mg/dL (ref 0.61–1.24)
GFR calc Af Amer: >60 mL/min (ref >60)
GFR calc non Af Amer: >60 mL/min (ref >60)
Glucose, Bld: 96 mg/dL (ref 70–99)
Potassium: 3.9 mmol/L (ref 3.5–5.1)
Sodium: 140 mmol/L (ref 135–145)

## 2018-11-09 LAB — URINALYSIS, ROUTINE W REFLEX MICROSCOPIC
Bacteria, UA: NONE SEEN
Bilirubin Urine: NEGATIVE
Glucose, UA: NEGATIVE mg/dL
Ketones, ur: 20 mg/dL — AB
Leukocytes,Ua: NEGATIVE
Nitrite: NEGATIVE
Protein, ur: NEGATIVE mg/dL
Specific Gravity, Urine: 1.013 (ref 1.005–1.030)
pH: 5 (ref 5.0–8.0)

## 2018-11-09 MED ORDER — TRAMADOL HCL 50 MG PO TABS: 50.0000 mg | ORAL_TABLET | Freq: Four times a day (QID) | ORAL | 0 refills | Status: DC | PRN

## 2018-11-09 NOTE — ED Triage Notes (Signed)
C/o left leg pain onset 1 week ago no change today, states he was given medication without relief. States the pain shoots down his leg.

## 2018-11-09 NOTE — ED Provider Notes (Signed)
[facility] EMERGENCY DEPARTMENT Provider Note   CSN: [id number] Arrival date & time: 11/09/18  1021    History   Chief Complaint Chief Complaint  Patient presents with  . Leg Pain    HPI Steven Lowery is a 73 y.o. male with past medical history of back pain, degenerative joint disease, hypertension, CAD presents emergency department today with chief complaint of left leg pain x1 week.  Patient states the pain starts in his left lower back and radiates down his left leg.  He describes the pain as sharp and shooting.  The pain has been intermittent.  He has a history of sciatica and states this feels similar.  He rates the pain 6 out of 10 in severity.  He denies any recent fall.  Also any numbness or weakness.  Denies urinary incontinence, retention, fecal incontinence, fever, chills, lower extremity edema, chest pain, shortness of breath.  Review shows patient was seen in the ED on 11/02/2018 for similar complaint.  At that visit he was given IM Toradol and discharged home with lidocaine ointment and tizanidine.  States that ointment has been helping.  He denies improvement with tizanidine.    Past Medical History:  Diagnosis Date  . Acute asthmatic bronchitis   . Alcohol abuse   . Back pain   . Benign prostatic hypertrophy   . Cigarette smoker   . Coronary artery disease   . DJD (degenerative joint disease)   . Hypertension   . IBS (irritable bowel syndrome)   . Myocardial infarction [facility])    per pt he thinks he had an MI "5 years ago"  . Neck pain   . Psychiatric disorder     Patient Active Problem List   Diagnosis Date Noted  . BPH (benign prostatic hyperplasia) 02/08/2015  . Decreased urine stream 02/08/2015  . 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 CIGARETTE SMOKER 06/28/2008  . Essential hypertension 06/28/2008  . CORONARY ARTERY DISEASE 06/28/2008  . ASTHMATIC BRONCHITIS, ACUTE 06/28/2008  .  Osteoarthritis 06/28/2008  . NECK PAIN 06/28/2008  . Tobacco dependence 06/28/2008  . Backache 12/24/2007  . ALCOHOL ABUSE 05/12/2007  . IRRITABLE BOWEL SYNDROME, HX OF 05/12/2007    Past Surgical History:  Procedure Laterality Date  . CYSTOSCOPY  1999   Dr. Janice Norrie  . HAND SURGERY    . TONSILLECTOMY          Home Medications    Prior to Admission medications   Medication Sig Start Date End Date Taking? Authorizing Provider  acetaminophen (TYLENOL) 325 MG tablet Take 2 tablets (650 mg total) by mouth every 6 (six) hours as needed for moderate pain. Patient not taking: Reported on 02/02/2018 09/16/15   Waynetta Pean, PA-C  amLODipine (NORVASC) 10 MG tablet Take 1 tablet (10 mg total) by mouth daily. 02/02/18   Lanae Boast, FNP  aspirin EC 81 MG tablet Take 1 tablet (81 mg total) by mouth daily. Patient not taking: Reported on 02/02/2018 08/03/15   Dorena Dew, FNP  celecoxib (CELEBREX) 200 MG capsule Take 1 capsule (200 mg total) by mouth daily. 02/05/18   Lanae Boast, FNP  docusate sodium (COLACE) 100 MG capsule Take 1 capsule (100 mg total) by mouth daily. Patient not taking: Reported on 02/02/2018 08/10/15   Dorena Dew, FNP  fish oil-omega-3 fatty acids 1000 MG capsule Take 1 g by mouth daily.     [provider]  gabapentin (NEURONTIN) 300 MG  capsule Take 1 capsule (300 mg total) by mouth 3 (three) times daily. 02/05/18   Lanae Boast, FNP  lactulose (CHRONULAC) 10 GM/15ML solution Take 15 mLs (10 g total) by mouth daily as needed for mild constipation (constipation). Patient not taking: Reported on 02/02/2018 03/12/16   Isla Pence, MD  lidocaine (XYLOCAINE) 5 % ointment Apply 1 application topically 4 (four) times daily as needed. 11/02/18   Long, Wonda Olds, MD  Multiple Vitamin (MULTIVITAMIN WITH MINERALS) TABS tablet Take 1 tablet by mouth daily.    [provider]  omeprazole (PRILOSEC) 20 MG capsule Take 1 capsule (20 mg total) by mouth daily.  Patient not taking: Reported on 02/02/2018 08/05/16   Dorena Dew, FNP  OVER THE COUNTER MEDICATION Place 2 drops into both eyes daily as needed (Eye burning). Reported on 09/11/2015    [provider]  senna-docusate (SENOKOT-S) 8.6-50 MG tablet Take 1 tablet by mouth 2 (two) times daily as needed for mild constipation or moderate constipation. Patient not taking: Reported on 02/02/2018 09/29/15   Forde Dandy, MD  sodium chloride (OCEAN) 0.65 % SOLN nasal spray Place 1 spray into both nostrils as needed for congestion. Patient not taking: Reported on 02/02/2018 06/03/14   Dahlia Bailiff, PA-C  tadalafil (CIALIS) 5 MG tablet Take 1 tablet (5 mg total) by mouth daily. Patient not taking: Reported on 02/02/2018 11/04/16   Dorena Dew, FNP  tamsulosin (FLOMAX) 0.4 MG CAPS capsule Take 1 capsule (0.4 mg total) by mouth daily. 02/02/18   Lanae Boast, FNP  tiZANidine (ZANAFLEX) 2 MG tablet Take 1 tablet (2 mg total) by mouth every 6 (six) hours as needed for muscle spasms. 11/02/18   Long, Wonda Olds, MD  traMADol (ULTRAM) 50 MG tablet Take 1 tablet (50 mg total) by mouth every 6 (six) hours as needed for severe pain. 11/09/18   Albrizze, Harley Hallmark, PA-C    Family History Family History  Problem Relation Age of Onset  . Breast cancer Sister   . Colon cancer Neg Hx   . Esophageal cancer Neg Hx   . Rectal cancer Neg Hx   . Stomach cancer Neg Hx     Social History Social History   Tobacco Use  . Smoking status: Current Some Day Smoker    Packs/day: 0.30    Years: 40.00    Pack years: 12.00    Types: Cigarettes  . Smokeless tobacco: Never Used  . Tobacco comment: has cut back alot per pt  Substance Use Topics  . Alcohol use: Yes    Comment: quit 3 months ago  . Drug use: No     Allergies   Lisinopril   Review of Systems Review of Systems  Constitutional: Negative for chills and fever.  HENT: Negative for congestion, rhinorrhea, sinus pressure and sore throat.   Eyes:  Negative for pain and redness.  Respiratory: Negative for cough, shortness of breath and wheezing.   Cardiovascular: Negative for chest pain and palpitations.  Gastrointestinal: Negative for abdominal pain, constipation, diarrhea, nausea and vomiting.  Genitourinary: Negative for dysuria.  Musculoskeletal: Positive for back pain. Negative for arthralgias, myalgias and neck pain.  Skin: Negative for rash and wound.  Neurological: Negative for dizziness, syncope, weakness, numbness and headaches.  Psychiatric/Behavioral: Negative for confusion.     Physical Exam Updated Vital Signs BP (!) 176/76 (BP Location: Right Arm)   Pulse (!) 59   Temp 98.2 F (36.8 C) (Oral)   Resp 16   Ht  6\' 2"  (1.88 m)   Wt 74.8 kg   SpO2 100%   BMI 21.18 kg/m   Physical Exam Vitals signs and nursing note reviewed.  Constitutional:      General: He is not in acute distress.    Appearance: He is not ill-appearing.  HENT:     Head: Normocephalic and atraumatic.     Right Ear: Tympanic membrane and external ear normal.     Left Ear: Tympanic membrane and external ear normal.     Nose: Nose normal.     Mouth/Throat:     Mouth: Mucous membranes are moist.     Pharynx: Oropharynx is clear.  Eyes:     General: No scleral icterus.       Right eye: No discharge.        Left eye: No discharge.     Extraocular Movements: Extraocular movements intact.     Conjunctiva/sclera: Conjunctivae normal.     Pupils: Pupils are equal, round, and reactive to light.  Neck:     Musculoskeletal: Normal range of motion.     Vascular: No JVD.  Cardiovascular:     Rate and Rhythm: Normal rate and regular rhythm.     Pulses: Normal pulses.          Radial pulses are 2+ on the right side and 2+ on the left side.       Dorsalis pedis pulses are 2+ on the right side and 2+ on the left side.     Heart sounds: Normal heart sounds.  Pulmonary:     Comments: Lungs clear to auscultation in all fields. Symmetric chest rise. No  wheezing, rales, or rhonchi. Abdominal:     Comments: Abdomen is soft, non-distended, and non-tender in all quadrants. No rigidity, no guarding. No peritoneal signs.  Musculoskeletal: Normal range of motion.       Arms:     Right lower leg: No edema.     Left lower leg: No edema.     Comments: Full range of motion of the thoracic spine and lumbar spine with flexion, hyperextension, and lateral flexion. No midline tenderness or stepoffs. No tenderness to palpation of the spinous processes of the thoracic spine or lumbar spine. Tenderness to palpation of the paraspinous muscles of left lumbar spine.Positivestraight leg raiseon left at 40'.   Skin:    General: Skin is warm and dry.     Capillary Refill: Capillary refill takes less than 2 seconds.  Neurological:     Mental Status: He is oriented to person, place, and time.     GCS: GCS eye subscore is 4. GCS verbal subscore is 5. GCS motor subscore is 6.     Comments: Sensation grossly intact to light touch in the lower extremities bilaterally. No saddle anesthesias. Strength 5/5 with flexion and extension at the bilateral hips, knees, and ankles. No noted gait deficit, pt ambulates with cane. Coordination intact with heel to shin testing.  Psychiatric:        Behavior: Behavior normal.      ED Treatments / Results  Labs (all labs ordered are listed, but only abnormal results are displayed) Labs Reviewed  CBC WITH DIFFERENTIAL/PLATELET - Abnormal; Notable for the following components:      Result Value   WBC 3.9 (*)    All other components within normal limits  URINALYSIS, ROUTINE W REFLEX MICROSCOPIC - Abnormal; Notable for the following components:   Hgb urine dipstick SMALL (*)    Ketones, ur  20 (*)    All other components within normal limits  BASIC METABOLIC PANEL    EKG None  Radiology Dg Lumbar Spine Complete  Result Date: 11/09/2018 CLINICAL DATA:  Low back pain radiating into the legs. EXAM: LUMBAR SPINE -  COMPLETE 4+ VIEW COMPARISON:  None. FINDINGS: There is no evidence of lumbar spine fracture. Alignment is normal. Intervertebral disc spaces are maintained. IMPRESSION: Negative exam. Electronically Signed   By: Inge Rise M.D.   On: 11/09/2018 12:48    Procedures Procedures (including critical care time)  Medications Ordered in ED Medications - No data to display   Initial Impression / Assessment and Plan / ED Course  I have reviewed the triage vital signs and the nursing notes.  Pertinent labs & imaging results that were available during my care of the patient were reviewed by me and considered in my medical decision making (see chart for details).  Patient with back pain rating down left leg.  He is overall well-appearing, no acute distress.  No neurological deficits and normal neuro exam.  Pt ambulates with steady gait.No loss of bowel or bladder control. No hernia on exam, no lymphadenopathy. No concern for cauda equina.  No fever, night sweats, weight loss, h/o cancer, IVDU.  Given this is second visit for back pain will check basic labs including CBC, BMP, UA.  Review does not show any recent imaging.  X-ray lumbar spine today shows no signs of fracture, alignment is normal. CBC and BMP unremarkable. UA without signs of urinary tract infection PDMP reviewed during this encounter.  Will discharge home with short course of tramadol for pain.  Discussed he should only take it for severe pain.  He can continue Tylenol and using the lidocaine ointment.  Patient is hemodynamically stable, in NAD, and able to ambulate in the ED. Evaluation does not show pathology that would require ongoing emergent intervention or inpatient treatment. I explained the diagnosis to the patient. Patient is comfortable with above plan and is stable for discharge at this time. All questions were answered prior to disposition. Strict return precautions for returning to the ED were discussed. Encouraged follow up  with PCP.  The patient was discussed with and seen by Dr. Johnney Killian who agrees with the treatment plan.  Final Clinical Impressions(s) / ED Diagnoses   Final diagnoses:  Left leg pain  Sciatica of left side    ED Discharge Orders         Ordered    traMADol (ULTRAM) 50 MG tablet  Every 6 hours PRN     11/09/18 1402           Cherre Robins, PA-C 11/09/18 1406    Charlesetta Shanks, MD 11/10/18 1127

## 2018-11-09 NOTE — ED Provider Notes (Signed)
Medical screening examination/treatment/procedure(s) were conducted as a shared visit with non-physician practitioner(s) and myself.  I personally evaluated the patient during the encounter.    Patient has been having pain that he reports radiates around from his lower back and directly into his groin and down the medial leg.  It has been going on this week.  Ports he chronically has some instability in the left leg.  He reports he has walk with a cane for several years.  He reports he can walk all right but after a while sometimes the leg will get a little weak and give out on him which is why he uses a cane.  Patient is alert and clinically well in appearance.  Tall and thin.  He can stand from being supine in the stretcher and bear weight fully.  He can walk independently without the cane.  He does not have weakness or instability with walking.  Abdomen is soft without any guarding.  No mass or fullness in the inguinal region.  No lymphadenopathy.  No scrotal swelling, left testicle nontender and smooth.  No mass in the inguinal canal.  I agree with plan and management.   Charlesetta Shanks, MD 11/09/18 [phone]

## 2018-11-09 NOTE — ED Notes (Signed)
ED Provider at bedside. 

## 2018-11-09 NOTE — Discharge Instructions (Addendum)
You have been seen today for back pain. Please read and follow all provided instructions. Return to the emergency room for worsening condition or new concerning symptoms.    1. Medications:  Prescription sent to your pharmacy for Tramadol. Please take this for severe pain only. Do not drive while taking this medication or operate machinery. Please take as directed.  You can take tylenol for pain as needed, please take as directed.  Continue usual home medications Take medications as prescribed. Please review all of the medicines and only take them if you do not have an allergy to them.   2. Treatment: rest, drink plenty of fluids.  Continue using lidocaine ointment for pain as prescribed  3. Follow Up: Please follow up with your primary doctor in 2-5 days for discussion of your diagnoses and further evaluation after today's visit; Call today to arrange your follow up.    It is also a possibility that you have an allergic reaction to any of the medicines that you have been prescribed - Everybody reacts differently to medications and while MOST people have no trouble with most medicines, you may have a reaction such as nausea, vomiting, rash, swelling, shortness of breath. If this is the case, please stop taking the medicine immediately and contact your physician.  ?

## 2018-11-09 NOTE — ED Notes (Signed)
Patient verbalizes understanding of discharge instructions. Opportunity for questioning and answers were provided. Armband removed by staff, pt discharged from ED.  

## 2018-11-13 ENCOUNTER — Ambulatory Visit (INDEPENDENT_AMBULATORY_CARE_PROVIDER_SITE_OTHER): Payer: Medicare Other | Admitting: Family Medicine

## 2018-11-13 ENCOUNTER — Other Ambulatory Visit: Payer: Self-pay

## 2018-11-13 ENCOUNTER — Encounter: Payer: Self-pay | Admitting: Family Medicine

## 2018-11-13 VITALS — BP 143/77 | HR 77 | Temp 98.5°F | Resp 16 | Ht 74.0 in | Wt 159.0 lb

## 2018-11-13 DIAGNOSIS — M79605 Pain in left leg: Secondary | ICD-10-CM | POA: Diagnosis not present

## 2018-11-13 DIAGNOSIS — I10 Essential (primary) hypertension: Secondary | ICD-10-CM

## 2018-11-13 LAB — POCT URINALYSIS DIPSTICK
Bilirubin, UA: NEGATIVE
Glucose, UA: NEGATIVE
Ketones, UA: NEGATIVE
Leukocytes, UA: NEGATIVE
Nitrite, UA: NEGATIVE
Protein, UA: NEGATIVE
Spec Grav, UA: 1.015 (ref 1.010–1.025)
Urobilinogen, UA: 1 E.U./dL
pH, UA: 5.5 (ref 5.0–8.0)

## 2018-11-13 NOTE — Patient Instructions (Signed)
Sciatica ° °Sciatica is pain, weakness, tingling, or loss of feeling (numbness) along the sciatic nerve. The sciatic nerve starts in the lower back and goes down the back of each leg. Sciatica usually goes away on its own or with treatment. Sometimes, sciatica may come back (recur). °What are the causes? °This condition happens when the sciatic nerve is pinched or has pressure put on it. This may be the result of: °· A disk in between the bones of the spine bulging out too far (herniated disk). °· Changes in the spinal disks that occur with aging. °· A condition that affects a muscle in the butt. °· Extra bone growth near the sciatic nerve. °· A break (fracture) of the area between your hip bones (pelvis). °· Pregnancy. °· Tumor. This is rare. °What increases the risk? °You are more likely to develop this condition if you: °· Play sports that put pressure or stress on the spine. °· Have poor strength and ease of movement (flexibility). °· Have had a back injury in the past. °· Have had back surgery. °· Sit for long periods of time. °· Do activities that involve bending or lifting over and over again. °· Are very overweight (obese). °What are the signs or symptoms? °Symptoms can vary from mild to very bad. They may include: °· Any of these problems in the lower back, leg, hip, or butt: °? Mild tingling, loss of feeling, or dull aches. °? Burning sensations. °? Sharp pains. °· Loss of feeling in the back of the calf or the sole of the foot. °· Leg weakness. °· Very bad back pain that makes it hard to move. °These symptoms may get worse when you cough, sneeze, or laugh. They may also get worse when you sit or stand for long periods of time. °How is this treated? °This condition often gets better without any treatment. However, treatment may include: °· Changing or cutting back on physical activity when you have pain. °· Doing exercises and stretching. °· Putting ice or heat on the affected area. °· Medicines that  help: °? To relieve pain and swelling. °? To relax your muscles. °· Shots (injections) of medicines that help to relieve pain, irritation, and swelling. °· Surgery. °Follow these instructions at home: °Medicines °· Take over-the-counter and prescription medicines only as told by your doctor. °· Ask your doctor if the medicine prescribed to you: °? Requires you to avoid driving or using heavy machinery. °? Can cause trouble pooping (constipation). You may need to take these steps to prevent or treat trouble pooping: °§ Drink enough fluids to keep your pee (urine) pale yellow. °§ Take over-the-counter or prescription medicines. °§ Eat foods that are high in fiber. These include beans, whole grains, and fresh fruits and vegetables. °§ Limit foods that are high in fat and sugar. These include fried or sweet foods. °Managing pain ° °  ° °· If told, put ice on the affected area. °? Put ice in a plastic bag. °? Place a towel between your skin and the bag. °? Leave the ice on for 20 minutes, 2-3 times a day. °· If told, put heat on the affected area. Use the heat source that your doctor tells you to use, such as a moist heat pack or a heating pad. °? Place a towel between your skin and the heat source. °? Leave the heat on for 20-30 minutes. °? Remove the heat if your skin turns bright red. This is very important if you are   unable to feel pain, heat, or cold. You may have a greater risk of getting burned. °Activity ° °· Return to your normal activities as told by your doctor. Ask your doctor what activities are safe for you. °· Avoid activities that make your symptoms worse. °· Take short rests during the day. °? When you rest for a long time, do some physical activity or stretching between periods of rest. °? Avoid sitting for a long time without moving. Get up and move around at least one time each hour. °· Exercise and stretch regularly, as told by your doctor. °· Do not lift anything that is heavier than 10 lb (4.5 kg)  while you have symptoms of sciatica. °? Avoid lifting heavy things even when you do not have symptoms. °? Avoid lifting heavy things over and over. °· When you lift objects, always lift in a way that is safe for your body. To do this, you should: °? Bend your knees. °? Keep the object close to your body. °? Avoid twisting. °General instructions °· Stay at a healthy weight. °· Wear comfortable shoes that support your feet. Avoid wearing high heels. °· Avoid sleeping on a mattress that is too soft or too hard. You might have less pain if you sleep on a mattress that is firm enough to support your back. °· Keep all follow-up visits as told by your doctor. This is important. °Contact a doctor if: °· You have pain that: °? Wakes you up when you are sleeping. °? Gets worse when you lie down. °? Is worse than the pain you have had in the past. °? Lasts longer than 4 weeks. °· You lose weight without trying. °Get help right away if: °· You cannot control when you pee (urinate) or poop (have a bowel movement). °· You have weakness in any of these areas and it gets worse: °? Lower back. °? The area between your hip bones. °? Butt. °? Legs. °· You have redness or swelling of your back. °· You have a burning feeling when you pee. °Summary °· Sciatica is pain, weakness, tingling, or loss of feeling (numbness) along the sciatic nerve. °· This condition happens when the sciatic nerve is pinched or has pressure put on it. °· Sciatica can cause pain, tingling, or loss of feeling (numbness) in the lower back, legs, hips, and butt. °· Treatment often includes rest, exercise, medicines, and putting ice or heat on the affected area. °This information is not intended to replace advice given to you by your health care provider. Make sure you discuss any questions you have with your health care provider. °Document Released: 01/30/2008 Document Revised: 05/11/2018 Document Reviewed: 05/11/2018 °Elsevier Patient Education © 2020 [employer] ° °

## 2018-11-13 NOTE — Progress Notes (Signed)
Patient [facility]   Progress Note: General Provider: Lanae Boast, FNP  SUBJECTIVE:   Steven Lowery is a 73 y.o. male who  has a past medical history of Acute asthmatic bronchitis, Alcohol abuse, Back pain, Benign prostatic hypertrophy, Cigarette smoker, Coronary artery disease, DJD (degenerative joint disease), Hypertension, IBS (irritable bowel syndrome), Myocardial infarction [facility]), Neck pain, and Psychiatric disorder.. Patient presents today for Hypertension and Leg Pain (left leg ) Patient presents for ED follow up on leg pain. Patient seen at the ED 11/02/2018 and 11/09/2018. He was given lidocaine and zanaflex without relief. He returned to the ED on  11/09/2018 due to continued pain. He was given tramadol. Reports that the tramadol has provided relief of pain. Patient admits to alcohol use. Unspecified amount. He also reports smoke 1/2 ppd. He is not currently interested in quitting.  Review of Systems  Constitutional: Negative.   HENT: Negative.   Eyes: Negative.   Respiratory: Negative.   Cardiovascular: Negative.   Gastrointestinal: Negative.   Genitourinary: Negative.   Musculoskeletal: Positive for back pain and joint pain.  Skin: Negative.   Neurological: Negative.   Psychiatric/Behavioral: Negative.      OBJECTIVE: BP (!) 143/77 (BP Location: Left Arm, Patient Position: Sitting, Cuff Size: Large)   Pulse 77   Temp 98.5 F (36.9 C) (Oral)   Resp 16   Ht 6\' 2"  (1.88 m)   Wt 159 lb (72.1 kg)   SpO2 100%   BMI 20.41 kg/m   Wt Readings from Last 3 Encounters:  11/13/18 159 lb (72.1 kg)  11/09/18 165 lb (74.8 kg)  02/02/18 168 lb (76.2 kg)     Physical Exam Vitals signs and nursing note reviewed.  Constitutional:      General: He is not in acute distress.    Appearance: Normal appearance.  HENT:     Head: Normocephalic and atraumatic.  Eyes:     Extraocular Movements: Extraocular movements intact.   Conjunctiva/sclera: Conjunctivae normal.     Pupils: Pupils are equal, round, and reactive to light.  Cardiovascular:     Rate and Rhythm: Normal rate and regular rhythm.     Heart sounds: No murmur.  Pulmonary:     Effort: Pulmonary effort is normal.     Breath sounds: Normal breath sounds.  Musculoskeletal: Normal range of motion.  Skin:    General: Skin is warm and dry.  Neurological:     Mental Status: He is alert and oriented to person, place, and time.     Gait: Gait abnormal (ambulates with cane).  Psychiatric:        Mood and Affect: Mood normal.        Behavior: Behavior normal.        Thought Content: Thought content normal.        Judgment: Judgment normal.     ASSESSMENT/PLAN:   1. Essential hypertension No medication changes warranted at the present time.   - Urinalysis Dipstick  2. Left leg pain Advised patient that tramadol is not safe in alcohol use. This provider will not prescribe this medication for him at this time. Offered to refill celebrex and gabapentin. He reports not having any relief with these pills. Will refer to pain management for further evaluation.  - Ambulatory referral to Pain Clinic      Return in about 6 months (around 05/16/2019).    The patient was given clear instructions to go to ER or return to medical  center if symptoms do not improve, worsen or new problems develop. The patient verbalized understanding and agreed with plan of care.   Ms. Doug Sou. Nathaneil Canary, FNP-BC [facility] [address] [place], [place] [address] [phone]

## 2019-01-13 ENCOUNTER — Encounter (HOSPITAL_COMMUNITY): Payer: Self-pay

## 2019-01-13 ENCOUNTER — Encounter (HOSPITAL_COMMUNITY): Payer: Self-pay | Admitting: *Deleted

## 2019-03-25 ENCOUNTER — Encounter (HOSPITAL_COMMUNITY): Payer: Self-pay | Admitting: Emergency Medicine

## 2019-03-25 ENCOUNTER — Other Ambulatory Visit: Payer: Self-pay

## 2019-03-25 ENCOUNTER — Emergency Department (HOSPITAL_COMMUNITY): Payer: Medicare Other

## 2019-03-25 ENCOUNTER — Emergency Department (HOSPITAL_COMMUNITY)
Admission: EM | Admit: 2019-03-25 | Discharge: 2019-03-25 | Disposition: A | Discharge status: Home / Self Care | Payer: Medicare Other | Attending: Emergency Medicine | Admitting: Emergency Medicine

## 2019-03-25 DIAGNOSIS — J449 Chronic obstructive pulmonary disease, unspecified: Secondary | ICD-10-CM | POA: Insufficient documentation

## 2019-03-25 DIAGNOSIS — Z7982 Long term (current) use of aspirin: Secondary | ICD-10-CM | POA: Insufficient documentation

## 2019-03-25 DIAGNOSIS — I2510 Atherosclerotic heart disease of native coronary artery without angina pectoris: Secondary | ICD-10-CM | POA: Insufficient documentation

## 2019-03-25 DIAGNOSIS — J40 Bronchitis, not specified as acute or chronic: Secondary | ICD-10-CM

## 2019-03-25 DIAGNOSIS — R0981 Nasal congestion: Secondary | ICD-10-CM | POA: Diagnosis present

## 2019-03-25 DIAGNOSIS — R05 Cough: Secondary | ICD-10-CM | POA: Diagnosis not present

## 2019-03-25 DIAGNOSIS — F17210 Nicotine dependence, cigarettes, uncomplicated: Secondary | ICD-10-CM | POA: Diagnosis not present

## 2019-03-25 DIAGNOSIS — Z79899 Other long term (current) drug therapy: Secondary | ICD-10-CM | POA: Insufficient documentation

## 2019-03-25 DIAGNOSIS — I10 Essential (primary) hypertension: Secondary | ICD-10-CM | POA: Insufficient documentation

## 2019-03-25 MED ORDER — DOXYCYCLINE HYCLATE 100 MG PO CAPS: 100.0000 mg | ORAL_CAPSULE | Freq: Two times a day (BID) | ORAL | 0 refills | Status: DC

## 2019-03-25 MED ORDER — ALBUTEROL SULFATE HFA 108 (90 BASE) MCG/ACT IN AERS: 1.0000 | INHALATION_SPRAY | Freq: Four times a day (QID) | RESPIRATORY_TRACT | 0 refills | Status: DC | PRN

## 2019-03-25 NOTE — ED Provider Notes (Signed)
[facility] EMERGENCY DEPARTMENT Provider Note   CSN: [id number] Arrival date & time: 03/25/19  0908     History   Chief Complaint Chief Complaint  Patient presents with  . Nasal Congestion    HPI Steven Lowery is a 73 y.o. male.     The history is provided by the patient. No language interpreter was used.  Cough Cough characteristics:  Productive Severity:  Moderate Onset quality:  Gradual Duration:  4 weeks Timing:  Constant Progression:  Worsening Chronicity:  New Smoker: yes   Context: smoke exposure and upper respiratory infection   Relieved by:  Nothing Worsened by:  Nothing Ineffective treatments:  None tried Associated symptoms: no shortness of breath and no sinus congestion   Risk factors: no recent infection   Pt reports he is a smoker.  Pt reports coughing up phelgm.  Pt reports he has had bronchitis in the past.  No has no known covid risk  Past Medical History:  Diagnosis Date  . Acute asthmatic bronchitis   . Alcohol abuse   . Back pain   . Benign prostatic hypertrophy   . Cigarette smoker   . Coronary artery disease   . DJD (degenerative joint disease)   . Hypertension   . IBS (irritable bowel syndrome)   . Myocardial infarction [facility])    per pt he thinks he had an MI "5 years ago"  . Neck pain   . Psychiatric disorder     Patient Active Problem List   Diagnosis Date Noted  . BPH (benign prostatic hyperplasia) 02/08/2015  . Decreased urine stream 02/08/2015  . 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 CIGARETTE SMOKER 06/28/2008  . Essential hypertension 06/28/2008  . CORONARY ARTERY DISEASE 06/28/2008  . ASTHMATIC BRONCHITIS, ACUTE 06/28/2008  . Osteoarthritis 06/28/2008  . NECK PAIN 06/28/2008  . Tobacco dependence 06/28/2008  . Backache 12/24/2007  . ALCOHOL ABUSE 05/12/2007  . IRRITABLE BOWEL SYNDROME, HX OF 05/12/2007    Past Surgical History:  Procedure  Laterality Date  . CYSTOSCOPY  1999   Dr. Janice Norrie  . HAND SURGERY    . TONSILLECTOMY          Home Medications    Prior to Admission medications   Medication Sig Start Date End Date Taking? Authorizing Provider  amLODipine (NORVASC) 10 MG tablet Take 1 tablet (10 mg total) by mouth daily. 02/02/18   Lanae Boast, FNP  aspirin EC 81 MG tablet Take 1 tablet (81 mg total) by mouth daily. Patient taking differently: Take 325 mg by mouth daily.  08/03/15   Dorena Dew, FNP  celecoxib (CELEBREX) 200 MG capsule Take 1 capsule (200 mg total) by mouth daily. Patient not taking: Reported on 11/13/2018 02/05/18   Lanae Boast, FNP  fish oil-omega-3 fatty acids 1000 MG capsule Take 1 g by mouth daily.     [provider]  gabapentin (NEURONTIN) 300 MG capsule Take 1 capsule (300 mg total) by mouth 3 (three) times daily. 02/05/18   Lanae Boast, FNP  lidocaine (XYLOCAINE) 5 % ointment Apply 1 application topically 4 (four) times daily as needed. 11/02/18   Long, Wonda Olds, MD  Multiple Vitamin (MULTIVITAMIN WITH MINERALS) TABS tablet Take 1 tablet by mouth daily.    [provider]  OVER THE COUNTER MEDICATION Place 2 drops into both eyes daily as needed (Eye burning). Reported on 09/11/2015    [provider]  tamsulosin (FLOMAX) 0.4 MG  CAPS capsule Take 1 capsule (0.4 mg total) by mouth daily. 02/02/18   Lanae Boast, FNP  tiZANidine (ZANAFLEX) 2 MG tablet Take 1 tablet (2 mg total) by mouth every 6 (six) hours as needed for muscle spasms. Patient not taking: Reported on 11/13/2018 11/02/18   Long, Wonda Olds, MD    Family History Family History  Problem Relation Age of Onset  . Breast cancer Sister   . Colon cancer Neg Hx   . Esophageal cancer Neg Hx   . Rectal cancer Neg Hx   . Stomach cancer Neg Hx     Social History Social History   Tobacco Use  . Smoking status: Current Some Day Smoker    Packs/day: 0.30    Years: 40.00    Pack years: 12.00    Types:  Cigarettes  . Smokeless tobacco: Never Used  . Tobacco comment: has cut back alot per pt  Substance Use Topics  . Alcohol use: Yes    Comment: quit 3 months ago  . Drug use: No     Allergies   Lisinopril   Review of Systems Review of Systems  Respiratory: Positive for cough. Negative for shortness of breath.   All other systems reviewed and are negative.    Physical Exam Updated Vital Signs BP (!) 184/101 (BP Location: Right Arm)   Pulse 70   Temp 98.1 F (36.7 C) (Oral)   Resp 16   Ht 6\' 2"  (1.88 m)   Wt 78.5 kg   SpO2 100%   BMI 22.21 kg/m   Physical Exam Vitals signs and nursing note reviewed.  Constitutional:      Appearance: He is well-developed.  HENT:     Head: Normocephalic and atraumatic.  Eyes:     Conjunctiva/sclera: Conjunctivae normal.  Neck:     Musculoskeletal: Neck supple.  Cardiovascular:     Rate and Rhythm: Normal rate and regular rhythm.     Heart sounds: No murmur.  Pulmonary:     Effort: Pulmonary effort is normal. No respiratory distress.     Breath sounds: Normal breath sounds.  Abdominal:     Palpations: Abdomen is soft.     Tenderness: There is no abdominal tenderness.  Musculoskeletal: Normal range of motion.  Skin:    General: Skin is warm and dry.  Neurological:     General: No focal deficit present.     Mental Status: He is alert.  Psychiatric:        Mood and Affect: Mood normal.      ED Treatments / Results  Labs (all labs ordered are listed, but only abnormal results are displayed) Labs Reviewed  SARS CORONAVIRUS 2 (TAT 6-24 HRS)    EKG None  Radiology Cxr Pa/lat  Result Date: 03/25/2019 CLINICAL DATA:  Cough EXAM: CHEST - 2 VIEW COMPARISON:  October 08, 2015 FINDINGS: There is slight bibasilar scarring. No edema or consolidation. Heart size and pulmonary vascularity are normal. No adenopathy. No bone lesions. IMPRESSION: Slight bibasilar scarring.  No edema or consolidation. Electronically Signed   By:  Lowella Grip III M.D.   On: 03/25/2019 10:05    Procedures Procedures (including critical care time)  Medications Ordered in ED Medications - No data to display   Initial Impression / Assessment and Plan / ED Course  I have reviewed the triage vital signs and the nursing notes.  Pertinent labs & imaging results that were available during my care of the patient were reviewed by me and considered  in my medical decision making (see chart for details).        MDM   Chest xray bronhitic changes, no pneumonia, I doubt covid bur will send test.  Pt given rx for albuterol and doxycycline  Final Clinical Impressions(s) / ED Diagnoses   Final diagnoses:  Bronchitis    ED Discharge Orders         Ordered    doxycycline (VIBRAMYCIN) 100 MG capsule  2 times daily     03/25/19 1034    albuterol (VENTOLIN HFA) 108 (90 Base) MCG/ACT inhaler  Every 6 hours PRN     03/25/19 1034         An After Visit Summary was printed and given to the patient.    Fransico Meadow, [place] 03/25/19 1046    Maudie Flakes, MD 03/26/19 [phone]

## 2019-03-25 NOTE — ED Triage Notes (Signed)
Pt reports nasal congestion for the last month. Denies recent fever. Pt reports some couhg. Reports current smoker. Pt alert, oriented x4. VSS.

## 2019-03-25 NOTE — Discharge Instructions (Addendum)
Return if any problems.

## 2019-04-09 ENCOUNTER — Emergency Department (HOSPITAL_COMMUNITY): Payer: Medicare Other

## 2019-04-09 ENCOUNTER — Inpatient Hospital Stay (HOSPITAL_COMMUNITY)
Admission: EM | Admit: 2019-04-09 | Discharge: 2019-04-13 | DRG: 247 | Disposition: A | Discharge status: Home / Self Care | Payer: Medicare Other | Attending: Cardiology | Admitting: Cardiology

## 2019-04-09 ENCOUNTER — Encounter (HOSPITAL_COMMUNITY): Payer: Self-pay | Admitting: Cardiology

## 2019-04-09 DIAGNOSIS — I10 Essential (primary) hypertension: Secondary | ICD-10-CM | POA: Diagnosis not present

## 2019-04-09 DIAGNOSIS — Z955 Presence of coronary angioplasty implant and graft: Secondary | ICD-10-CM

## 2019-04-09 DIAGNOSIS — K589 Irritable bowel syndrome without diarrhea: Secondary | ICD-10-CM | POA: Diagnosis present

## 2019-04-09 DIAGNOSIS — J439 Emphysema, unspecified: Secondary | ICD-10-CM | POA: Diagnosis present

## 2019-04-09 DIAGNOSIS — R001 Bradycardia, unspecified: Secondary | ICD-10-CM | POA: Diagnosis not present

## 2019-04-09 DIAGNOSIS — Z79899 Other long term (current) drug therapy: Secondary | ICD-10-CM

## 2019-04-09 DIAGNOSIS — F1010 Alcohol abuse, uncomplicated: Secondary | ICD-10-CM | POA: Diagnosis present

## 2019-04-09 DIAGNOSIS — J449 Chronic obstructive pulmonary disease, unspecified: Secondary | ICD-10-CM | POA: Diagnosis present

## 2019-04-09 DIAGNOSIS — Z720 Tobacco use: Secondary | ICD-10-CM

## 2019-04-09 DIAGNOSIS — Z716 Tobacco abuse counseling: Secondary | ICD-10-CM

## 2019-04-09 DIAGNOSIS — Z23 Encounter for immunization: Secondary | ICD-10-CM

## 2019-04-09 DIAGNOSIS — I252 Old myocardial infarction: Secondary | ICD-10-CM

## 2019-04-09 DIAGNOSIS — Z888 Allergy status to other drugs, medicaments and biological substances status: Secondary | ICD-10-CM

## 2019-04-09 DIAGNOSIS — R072 Precordial pain: Secondary | ICD-10-CM | POA: Diagnosis not present

## 2019-04-09 DIAGNOSIS — T465X6A Underdosing of other antihypertensive drugs, initial encounter: Secondary | ICD-10-CM | POA: Diagnosis present

## 2019-04-09 DIAGNOSIS — I214 Non-ST elevation (NSTEMI) myocardial infarction: Principal | ICD-10-CM | POA: Diagnosis present

## 2019-04-09 DIAGNOSIS — I2510 Atherosclerotic heart disease of native coronary artery without angina pectoris: Secondary | ICD-10-CM | POA: Diagnosis present

## 2019-04-09 DIAGNOSIS — Z7982 Long term (current) use of aspirin: Secondary | ICD-10-CM

## 2019-04-09 DIAGNOSIS — F99 Mental disorder, not otherwise specified: Secondary | ICD-10-CM | POA: Diagnosis present

## 2019-04-09 DIAGNOSIS — Z8249 Family history of ischemic heart disease and other diseases of the circulatory system: Secondary | ICD-10-CM

## 2019-04-09 DIAGNOSIS — F17210 Nicotine dependence, cigarettes, uncomplicated: Secondary | ICD-10-CM | POA: Diagnosis present

## 2019-04-09 DIAGNOSIS — F17200 Nicotine dependence, unspecified, uncomplicated: Secondary | ICD-10-CM | POA: Diagnosis present

## 2019-04-09 DIAGNOSIS — N400 Enlarged prostate without lower urinary tract symptoms: Secondary | ICD-10-CM | POA: Diagnosis present

## 2019-04-09 DIAGNOSIS — Z20828 Contact with and (suspected) exposure to other viral communicable diseases: Secondary | ICD-10-CM | POA: Diagnosis present

## 2019-04-09 DIAGNOSIS — K5900 Constipation, unspecified: Secondary | ICD-10-CM | POA: Diagnosis present

## 2019-04-09 LAB — CBC
HCT: 44.9 % (ref 39.0–52.0)
HCT: 42.3 % (ref 39.0–52.0)
Hemoglobin: 15.2 g/dL (ref 13.0–17.0)
Hemoglobin: 14.9 g/dL (ref 13.0–17.0)
MCH: 31.7 pg (ref 26.0–34.0)
MCH: 32.3 pg (ref 26.0–34.0)
MCHC: 33.9 g/dL (ref 30.0–36.0)
MCHC: 35.2 g/dL (ref 30.0–36.0)
MCV: 93.5 fL (ref 80.0–100.0)
MCV: 91.8 fL (ref 80.0–100.0)
Platelets: 257 10*3/uL (ref 150–400)
Platelets: 305 10*3/uL (ref 150–400)
RBC: 4.8 MIL/uL (ref 4.22–5.81)
RBC: 4.61 MIL/uL (ref 4.22–5.81)
RDW: 12.6 % (ref 11.5–15.5)
RDW: 12.7 % (ref 11.5–15.5)
WBC: 5.6 10*3/uL (ref 4.0–10.5)
WBC: 5.4 10*3/uL (ref 4.0–10.5)
nRBC: 0 % (ref 0.0–0.2)
nRBC: 0 % (ref 0.0–0.2)

## 2019-04-09 LAB — BASIC METABOLIC PANEL
Anion gap: 7 (ref 5–15)
BUN: 11 mg/dL (ref 8–23)
CO2: 26 mmol/L (ref 22–32)
Calcium: 9.5 mg/dL (ref 8.9–10.3)
Chloride: 106 mmol/L (ref 98–111)
Creatinine, Ser: 1.18 mg/dL (ref 0.61–1.24)
GFR calc Af Amer: >60 mL/min (ref >60)
GFR calc non Af Amer: >60 mL/min (ref >60)
Glucose, Bld: 107 mg/dL — ABNORMAL HIGH (ref 70–99)
Potassium: 4.1 mmol/L (ref 3.5–5.1)
Sodium: 139 mmol/L (ref 135–145)

## 2019-04-09 LAB — CREATININE, SERUM
Creatinine, Ser: 1.02 mg/dL (ref 0.61–1.24)
GFR calc Af Amer: >60 mL/min (ref >60)
GFR calc non Af Amer: >60 mL/min (ref >60)

## 2019-04-09 LAB — SARS CORONAVIRUS 2 (TAT 6-24 HRS): SARS Coronavirus 2: NEGATIVE

## 2019-04-09 LAB — TROPONIN I (HIGH SENSITIVITY)
Troponin I (High Sensitivity): 69 ng/L — ABNORMAL HIGH (ref <18)
Troponin I (High Sensitivity): 84 ng/L — ABNORMAL HIGH (ref <18)
Troponin I (High Sensitivity): 58 ng/L — ABNORMAL HIGH (ref <18)
Troponin I (High Sensitivity): 49 ng/L — ABNORMAL HIGH (ref <18)

## 2019-04-09 MED ORDER — ATORVASTATIN CALCIUM 40 MG PO TABS
40.0000 mg | ORAL_TABLET | Freq: Every day | ORAL | Status: DC
  Administered 2019-04-09 – 2019-04-12 (×3): 40 mg via ORAL
  Filled 2019-04-09 (×3): qty 1

## 2019-04-09 MED ORDER — ENOXAPARIN SODIUM 40 MG/0.4ML ~~LOC~~ SOLN
40.0000 mg | SUBCUTANEOUS | Status: DC
  Administered 2019-04-09: 40 mg via SUBCUTANEOUS
  Filled 2019-04-09: qty 0.4

## 2019-04-09 MED ORDER — TAMSULOSIN HCL 0.4 MG PO CAPS
0.4000 mg | ORAL_CAPSULE | Freq: Every day | ORAL | Status: DC
  Administered 2019-04-09 – 2019-04-12 (×3): 0.4 mg via ORAL
  Filled 2019-04-09 (×3): qty 1

## 2019-04-09 MED ORDER — ACETAMINOPHEN 325 MG PO TABS: 650.0000 mg | ORAL_TABLET | ORAL | Status: DC | PRN

## 2019-04-09 MED ORDER — HEPARIN BOLUS VIA INFUSION
4000.0000 [IU] | Freq: Once | INTRAVENOUS | Status: AC
  Administered 2019-04-09: 4000 [IU] via INTRAVENOUS
  Filled 2019-04-09: qty 4000

## 2019-04-09 MED ORDER — INFLUENZA VAC A&B SA ADJ QUAD 0.5 ML IM PRSY
0.5000 mL | PREFILLED_SYRINGE | INTRAMUSCULAR | Status: DC
  Filled 2019-04-09: qty 0.5

## 2019-04-09 MED ORDER — HEPARIN (PORCINE) 25000 UT/250ML-% IV SOLN
1000.0000 [IU]/h | INTRAVENOUS | Status: DC
  Administered 2019-04-09: 1000 [IU]/h via INTRAVENOUS
  Filled 2019-04-09: qty 250

## 2019-04-09 MED ORDER — ASPIRIN 81 MG PO CHEW
324.0000 mg | CHEWABLE_TABLET | Freq: Once | ORAL | Status: AC
  Administered 2019-04-09: 324 mg via ORAL
  Filled 2019-04-09: qty 4

## 2019-04-09 MED ORDER — PNEUMOCOCCAL VAC POLYVALENT 25 MCG/0.5ML IJ INJ: 0.5000 mL | INJECTION | INTRAMUSCULAR | Status: DC

## 2019-04-09 MED ORDER — AMLODIPINE BESYLATE 5 MG PO TABS: 5.0000 mg | ORAL_TABLET | Freq: Every day | ORAL | Status: DC

## 2019-04-09 MED ORDER — ONDANSETRON HCL 4 MG/2ML IJ SOLN: 4.0000 mg | Freq: Four times a day (QID) | INTRAMUSCULAR | Status: DC | PRN

## 2019-04-09 MED ORDER — ASPIRIN EC 81 MG PO TBEC
81.0000 mg | DELAYED_RELEASE_TABLET | Freq: Every day | ORAL | Status: DC
  Administered 2019-04-10 – 2019-04-13 (×3): 81 mg via ORAL
  Filled 2019-04-09 (×3): qty 1

## 2019-04-09 NOTE — ED Provider Notes (Signed)
[place] EMERGENCY DEPARTMENT Provider Note   CSN: [id number] Arrival date & time: 04/09/19  1118     History   Chief Complaint No chief complaint on file.   HPI Steven Lowery is a 73 y.o. male.     Patient with history of hypertension, CAD, previous MI presenting from PCPs office with chest pain.  States been having left-sided chest pain for the past 4 nights mostly at night.  The pain is sharp in the left side of his chest and does not radiate.  It resolves after he takes nitroglycerin.  He is not having chest pain currently.  There is no associated shortness of breath, nausea, vomiting, diaphoresis, fever or cough.  He denies having any stents in his heart. does not have a cardiologist.  He states his been taking nitroglycerin nightly for the past 4 nights and he only gets the pain when he lies down at night.  Last for several minutes at a time.  There is no abdominal pain or back pain.  No cough or fever.  No leg pain or leg swelling.  He did have a cardiac cath in 2001 showing 40% disease in the LAD. In 2012 he was admitted with chest pain. Coronary CTA showed 50% mid LAD stenosis. Myoview was negative for ischemia.  The history is provided by the patient.    Past Medical History:  Diagnosis Date  . Acute asthmatic bronchitis   . Alcohol abuse   . Back pain   . Benign prostatic hypertrophy   . Cigarette smoker   . Coronary artery disease   . DJD (degenerative joint disease)   . Hypertension   . IBS (irritable bowel syndrome)   . Myocardial infarction [facility])    per pt he thinks he had an MI "5 years ago"  . Neck pain   . Psychiatric disorder     Patient Active Problem List   Diagnosis Date Noted  . BPH (benign prostatic hyperplasia) 02/08/2015  . Decreased urine stream 02/08/2015  . 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 CIGARETTE SMOKER 06/28/2008  . Essential hypertension 06/28/2008  . CORONARY  ARTERY DISEASE 06/28/2008  . ASTHMATIC BRONCHITIS, ACUTE 06/28/2008  . Osteoarthritis 06/28/2008  . NECK PAIN 06/28/2008  . Tobacco dependence 06/28/2008  . Backache 12/24/2007  . ALCOHOL ABUSE 05/12/2007  . IRRITABLE BOWEL SYNDROME, HX OF 05/12/2007    Past Surgical History:  Procedure Laterality Date  . CYSTOSCOPY  1999   Dr. Janice Norrie  . HAND SURGERY    . TONSILLECTOMY          Home Medications    Prior to Admission medications   Medication Sig Start Date End Date Taking? Authorizing Provider  albuterol (VENTOLIN HFA) 108 (90 Base) MCG/ACT inhaler Inhale 1-2 puffs into the lungs every 6 (six) hours as needed for wheezing or shortness of breath. 03/25/19   Fransico Meadow, PA-C  amLODipine (NORVASC) 10 MG tablet Take 1 tablet (10 mg total) by mouth daily. 02/02/18   Lanae Boast, FNP  aspirin EC 81 MG tablet Take 1 tablet (81 mg total) by mouth daily. Patient taking differently: Take 325 mg by mouth daily.  08/03/15   Dorena Dew, FNP  doxycycline (VIBRAMYCIN) 100 MG capsule Take 1 capsule (100 mg total) by mouth 2 (two) times daily. 03/25/19   Fransico Meadow, PA-C  fish oil-omega-3 fatty acids 1000 MG capsule Take 1 g by mouth daily.  [provider]  gabapentin (NEURONTIN) 300 MG capsule Take 1 capsule (300 mg total) by mouth 3 (three) times daily. 02/05/18   Lanae Boast, FNP  lidocaine (XYLOCAINE) 5 % ointment Apply 1 application topically 4 (four) times daily as needed. 11/02/18   Long, Wonda Olds, MD  Multiple Vitamin (MULTIVITAMIN WITH MINERALS) TABS tablet Take 1 tablet by mouth daily.    [provider]  OVER THE COUNTER MEDICATION Place 2 drops into both eyes daily as needed (Eye burning). Reported on 09/11/2015    [provider]  tamsulosin (FLOMAX) 0.4 MG CAPS capsule Take 1 capsule (0.4 mg total) by mouth daily. 02/02/18   Lanae Boast, FNP  tiZANidine (ZANAFLEX) 2 MG tablet Take 1 tablet (2 mg total) by mouth every 6 (six) hours as  needed for muscle spasms. Patient not taking: Reported on 11/13/2018 11/02/18   Long, Wonda Olds, MD    Family History Family History  Problem Relation Age of Onset  . Breast cancer Sister   . Colon cancer Neg Hx   . Esophageal cancer Neg Hx   . Rectal cancer Neg Hx   . Stomach cancer Neg Hx     Social History Social History   Tobacco Use  . Smoking status: Current Some Day Smoker    Packs/day: 0.30    Years: 40.00    Pack years: 12.00    Types: Cigarettes  . Smokeless tobacco: Never Used  . Tobacco comment: has cut back alot per pt  Substance Use Topics  . Alcohol use: Yes    Comment: quit 3 months ago  . Drug use: No     Allergies   Lisinopril   Review of Systems Review of Systems  Constitutional: Negative for activity change, appetite change, fatigue and fever.  HENT: Negative for congestion and rhinorrhea.   Respiratory: Positive for chest tightness. Negative for cough and shortness of breath.   Cardiovascular: Positive for chest pain.  Gastrointestinal: Negative for abdominal pain, nausea and vomiting.  Genitourinary: Negative for dysuria and hematuria.  Musculoskeletal: Negative for arthralgias and myalgias.  Skin: Negative for rash.  Neurological: Negative for dizziness, weakness and headaches.    all other systems are negative except as noted in the HPI and PMH.    Physical Exam Updated Vital Signs BP (!) 151/120 (BP Location: Right Arm)   Pulse 69   Temp 98.3 F (36.8 C) (Oral)   Resp 16   SpO2 98%   Physical Exam Vitals signs and nursing note reviewed.  Constitutional:      General: He is not in acute distress.    Appearance: Normal appearance. He is well-developed and normal weight. He is not ill-appearing.  HENT:     Head: Normocephalic and atraumatic.     Mouth/Throat:     Pharynx: No oropharyngeal exudate.  Eyes:     Conjunctiva/sclera: Conjunctivae normal.     Pupils: Pupils are equal, round, and reactive to light.  Neck:      Musculoskeletal: Normal range of motion and neck supple.     Comments: No meningismus. Cardiovascular:     Rate and Rhythm: Normal rate and regular rhythm.     Heart sounds: Normal heart sounds. No murmur.     Comments: Equal radial pulses and grip strength Pulmonary:     Effort: Pulmonary effort is normal. No respiratory distress.     Breath sounds: Normal breath sounds.  Chest:     Chest wall: No tenderness.  Abdominal:  Palpations: Abdomen is soft.     Tenderness: There is no abdominal tenderness. There is no guarding or rebound.  Musculoskeletal: Normal range of motion.        General: No tenderness.  Skin:    General: Skin is warm.     Capillary Refill: Capillary refill takes less than 2 seconds.     Findings: No rash.  Neurological:     Mental Status: He is alert and oriented to person, place, and time.     Cranial Nerves: No cranial nerve deficit.     Motor: No abnormal muscle tone.     Coordination: Coordination normal.     Comments: No ataxia on finger to nose bilaterally. No pronator drift. 5/5 strength throughout. CN 2-12 intact.Equal grip strength. Sensation intact.   Psychiatric:        Behavior: Behavior normal.      ED Treatments / Results  Labs (all labs ordered are listed, but only abnormal results are displayed) Labs Reviewed  BASIC METABOLIC PANEL - Abnormal; Notable for the following components:      Result Value   Glucose, Bld 107 (*)    All other components within normal limits  TROPONIN I (HIGH SENSITIVITY) - Abnormal; Notable for the following components:   Troponin I (High Sensitivity) 69 (*)    All other components within normal limits  TROPONIN I (HIGH SENSITIVITY) - Abnormal; Notable for the following components:   Troponin I (High Sensitivity) 84 (*)    All other components within normal limits  TROPONIN I (HIGH SENSITIVITY) - Abnormal; Notable for the following components:   Troponin I (High Sensitivity) 58 (*)    All other components  within normal limits  TROPONIN I (HIGH SENSITIVITY) - Abnormal; Notable for the following components:   Troponin I (High Sensitivity) 49 (*)    All other components within normal limits  SARS CORONAVIRUS 2 (TAT 6-24 HRS)  CBC  CBC  CREATININE, SERUM    EKG EKG Interpretation  Date/Time:  Friday April 09 2019 15:05:20 EST Ventricular Rate:  68 PR Interval:    QRS Duration: 109 QT Interval:  414 QTC Calculation: 441 R Axis:   58 Text Interpretation: Sinus rhythm Anteroseptal infarct, old Repol abnrm suggests ischemia, anterolateral new inferior and lateral T wave inversions Confirmed by Ezequiel Essex [phone]) on 04/09/2019 3:15:41 PM   Radiology Dg Chest 2 View  Result Date: 04/09/2019 CLINICAL DATA:  Chest pain EXAM: CHEST - 2 VIEW COMPARISON:  03/25/2019 FINDINGS: Lungs are adequately inflated without consolidation or effusion. Cardiomediastinal silhouette and remainder of the exam is unchanged. IMPRESSION: No active cardiopulmonary disease. Electronically Signed   By: Marin Olp M.D.   On: 04/09/2019 12:49    Procedures .Critical Care Performed by: Ezequiel Essex, MD Authorized by: Ezequiel Essex, MD   Critical care provider statement:    Critical care time (minutes):  35   Critical care was time spent personally by me on the following activities:  Discussions with consultants, evaluation of patient's response to treatment, examination of patient, ordering and performing treatments and interventions, ordering and review of laboratory studies, ordering and review of radiographic studies, pulse oximetry, re-evaluation of patient's condition, obtaining history from patient or surrogate and review of old charts   (including critical care time)  Medications Ordered in ED Medications  aspirin chewable tablet 324 mg (has no administration in time range)     Initial Impression / Assessment and Plan / ED Course  I have reviewed the triage vital  signs and the nursing  notes.  Pertinent labs & imaging results that were available during my care of the patient were reviewed by me and considered in my medical decision making (see chart for details).       Patient here with intermittent chest pain for the past 4 days.  Present resolved with nitroglycerin.  No chest pain currently.  EKG is concerning for new inferior lateral T wave inversions.  First troponin is elevated at 69.  Patient given aspirin and will start IV heparin  Patient remains chest pain-free. Discussed with cardiology concerning for NSTEMI.  They will evaluate.\ Low suspicion for pulmonary embolism or aortic dissection.  Dr. Martinique will admit. Patient remains chest pain free.   Final Clinical Impressions(s) / ED Diagnoses   Final diagnoses:  NSTEMI (non-ST elevated myocardial infarction) [facility])    ED Discharge Orders    None       Ezequiel Essex, MD 04/09/19 2354
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
## 2019-04-09 NOTE — Progress Notes (Signed)
Chaplain visited the patient to provide him with a Bible.  The chaplain will follow-up if the patient needs further assistance.  Brion Aliment Chaplain Resident For questions concerning this note please contact me by pager [phone]

## 2019-04-09 NOTE — ED Notes (Signed)
Pt allowed to have food and drink until midnight. No caffeine at all until after stress test which is scheduled tomorrow morning.

## 2019-04-09 NOTE — ED Triage Notes (Signed)
Pt here from MD office with c/o chest pain , pt took his nitro which relieved the pain , pt also had 324mg  asa

## 2019-04-09 NOTE — Progress Notes (Signed)
ANTICOAGULATION CONSULT NOTE - Initial Consult  Pharmacy Consult for heparin Indication: chest pain/ACS  Allergies  Allergen Reactions  . Lisinopril Cough    Patient Measurements: Height: 6\' 2"  (188 cm) Weight: 173 lb (78.5 kg) IBW/kg (Calculated) : 82.2 Heparin Dosing Weight: 78.5kg  Vital Signs: Temp: 98.3 F (36.8 C) (12/04 1127) Temp Source: Oral (12/04 1127) BP: 169/86 (12/04 1538) Pulse Rate: 62 (12/04 1538)  Labs: Recent Labs    04/09/19 1134  HGB 15.2  HCT 44.9  PLT 257  CREATININE 1.18  TROPONINIHS 69*    Estimated Creatinine Clearance: 61.9 mL/min (by C-G formula based on SCr of 1.18 mg/dL).   Medical History: Past Medical History:  Diagnosis Date  . Acute asthmatic bronchitis   . Alcohol abuse   . Back pain   . Benign prostatic hypertrophy   . Cigarette smoker   . Coronary artery disease   . DJD (degenerative joint disease)   . Hypertension   . IBS (irritable bowel syndrome)   . Myocardial infarction [facility])    per pt he thinks he had an MI "5 years ago"  . Neck pain   . Psychiatric disorder     Medications:  Infusions:  . sodium chloride    . heparin      Assessment: 66 yom presented to the ED with CP. History of CAD and troponin is minimally elevated. To start IV heparin.  Baseline CBC is WNL and he is not on anticoagulation PTA.   Goal of Therapy:  Heparin level 0.3-0.7 units/ml Monitor platelets by anticoagulation protocol: Yes   Plan:  Heparin bolus 4000 units IV x 1 Heparin gtt 1000 units/hr Check an 8 hr heparin level Daily heparin level and CBC  Krystena Reitter, Rande Lawman 04/09/2019,3:47 PM

## 2019-04-10 ENCOUNTER — Other Ambulatory Visit: Payer: Self-pay | Admitting: Family Medicine

## 2019-04-10 ENCOUNTER — Other Ambulatory Visit: Payer: Self-pay

## 2019-04-10 ENCOUNTER — Encounter (HOSPITAL_COMMUNITY): Payer: Self-pay

## 2019-04-10 ENCOUNTER — Observation Stay (HOSPITAL_COMMUNITY): Payer: Medicare Other

## 2019-04-10 DIAGNOSIS — J449 Chronic obstructive pulmonary disease, unspecified: Secondary | ICD-10-CM | POA: Diagnosis present

## 2019-04-10 DIAGNOSIS — I214 Non-ST elevation (NSTEMI) myocardial infarction: Secondary | ICD-10-CM | POA: Diagnosis present

## 2019-04-10 DIAGNOSIS — R079 Chest pain, unspecified: Secondary | ICD-10-CM

## 2019-04-10 DIAGNOSIS — N400 Enlarged prostate without lower urinary tract symptoms: Secondary | ICD-10-CM | POA: Diagnosis present

## 2019-04-10 DIAGNOSIS — K589 Irritable bowel syndrome without diarrhea: Secondary | ICD-10-CM | POA: Diagnosis not present

## 2019-04-10 DIAGNOSIS — F17210 Nicotine dependence, cigarettes, uncomplicated: Secondary | ICD-10-CM | POA: Diagnosis present

## 2019-04-10 DIAGNOSIS — Z8249 Family history of ischemic heart disease and other diseases of the circulatory system: Secondary | ICD-10-CM | POA: Diagnosis not present

## 2019-04-10 DIAGNOSIS — Z20828 Contact with and (suspected) exposure to other viral communicable diseases: Secondary | ICD-10-CM | POA: Diagnosis present

## 2019-04-10 DIAGNOSIS — K5900 Constipation, unspecified: Secondary | ICD-10-CM | POA: Diagnosis present

## 2019-04-10 DIAGNOSIS — Z23 Encounter for immunization: Secondary | ICD-10-CM | POA: Diagnosis not present

## 2019-04-10 DIAGNOSIS — R001 Bradycardia, unspecified: Secondary | ICD-10-CM | POA: Diagnosis not present

## 2019-04-10 DIAGNOSIS — Z720 Tobacco use: Secondary | ICD-10-CM | POA: Diagnosis not present

## 2019-04-10 DIAGNOSIS — Z716 Tobacco abuse counseling: Secondary | ICD-10-CM | POA: Diagnosis not present

## 2019-04-10 DIAGNOSIS — R9439 Abnormal result of other cardiovascular function study: Secondary | ICD-10-CM

## 2019-04-10 DIAGNOSIS — I2510 Atherosclerotic heart disease of native coronary artery without angina pectoris: Secondary | ICD-10-CM | POA: Diagnosis present

## 2019-04-10 DIAGNOSIS — F1010 Alcohol abuse, uncomplicated: Secondary | ICD-10-CM | POA: Diagnosis present

## 2019-04-10 DIAGNOSIS — Z79899 Other long term (current) drug therapy: Secondary | ICD-10-CM | POA: Diagnosis not present

## 2019-04-10 DIAGNOSIS — T465X6A Underdosing of other antihypertensive drugs, initial encounter: Secondary | ICD-10-CM | POA: Diagnosis present

## 2019-04-10 DIAGNOSIS — I200 Unstable angina: Secondary | ICD-10-CM | POA: Diagnosis not present

## 2019-04-10 DIAGNOSIS — I252 Old myocardial infarction: Secondary | ICD-10-CM | POA: Diagnosis not present

## 2019-04-10 DIAGNOSIS — F99 Mental disorder, not otherwise specified: Secondary | ICD-10-CM | POA: Diagnosis present

## 2019-04-10 DIAGNOSIS — Z888 Allergy status to other drugs, medicaments and biological substances status: Secondary | ICD-10-CM | POA: Diagnosis not present

## 2019-04-10 DIAGNOSIS — R338 Other retention of urine: Secondary | ICD-10-CM

## 2019-04-10 DIAGNOSIS — N401 Enlarged prostate with lower urinary tract symptoms: Secondary | ICD-10-CM

## 2019-04-10 DIAGNOSIS — R072 Precordial pain: Secondary | ICD-10-CM | POA: Diagnosis not present

## 2019-04-10 DIAGNOSIS — I10 Essential (primary) hypertension: Secondary | ICD-10-CM | POA: Diagnosis present

## 2019-04-10 DIAGNOSIS — Z7982 Long term (current) use of aspirin: Secondary | ICD-10-CM | POA: Diagnosis not present

## 2019-04-10 LAB — NM MYOCAR MULTI W/SPECT W/WALL MOTION / EF
Estimated workload: 1 METS
Exercise duration (min): 0 min
Exercise duration (sec): 0 s
LV dias vol: 72 mL (ref 62–150)
LV sys vol: 134 mL
MPHR: 147 {beats}/min
Peak HR: 108 {beats}/min
Percent HR: 73 %
Rest HR: 64 {beats}/min

## 2019-04-10 MED ORDER — REGADENOSON 0.4 MG/5ML IV SOLN
INTRAVENOUS | Status: AC
  Filled 2019-04-10: qty 5

## 2019-04-10 MED ORDER — TECHNETIUM TC 99M TETROFOSMIN IV KIT
10.0000 | PACK | Freq: Once | INTRAVENOUS | Status: AC | PRN
  Administered 2019-04-10: 10 via INTRAVENOUS

## 2019-04-10 MED ORDER — TECHNETIUM TC 99M TETROFOSMIN IV KIT
30.0000 | PACK | Freq: Once | INTRAVENOUS | Status: AC | PRN
  Administered 2019-04-10: 30 via INTRAVENOUS

## 2019-04-10 MED ORDER — AMLODIPINE BESYLATE 10 MG PO TABS
10.0000 mg | ORAL_TABLET | Freq: Every day | ORAL | Status: DC
  Administered 2019-04-10 – 2019-04-12 (×3): 10 mg via ORAL
  Filled 2019-04-10 (×3): qty 1

## 2019-04-10 MED ORDER — REGADENOSON 0.4 MG/5ML IV SOLN
0.4000 mg | Freq: Once | INTRAVENOUS | Status: AC
  Administered 2019-04-10: 0.4 mg via INTRAVENOUS
  Filled 2019-04-10: qty 5

## 2019-04-10 NOTE — Progress Notes (Addendum)
Progress Note  Patient Name: Steven Lowery Date of Encounter: 04/10/2019  Primary Cardiologist:   No primary care provider on file.   Subjective   Saw patient down in nuclear medicine. No recurrent chest pain. No shortness of breath, palpitations, lightheadedness, or dizziness.  Inpatient Medications    Scheduled Meds: . amLODipine  5 mg Oral Daily  . aspirin EC  81 mg Oral Daily  . atorvastatin  40 mg Oral q1800  . enoxaparin (LOVENOX) injection  40 mg Subcutaneous Q24H  . influenza vaccine adjuvanted  0.5 mL Intramuscular Tomorrow-1000  . pneumococcal 23 valent vaccine  0.5 mL Intramuscular Tomorrow-1000  . regadenoson      . regadenoson  0.4 mg Intravenous Once  . tamsulosin  0.4 mg Oral QPC supper   Continuous Infusions:  PRN Meds: acetaminophen, ondansetron (ZOFRAN) IV   Vital Signs    Vitals:   04/09/19 2016 04/09/19 2359 04/10/19 0506 04/10/19 0856  BP: (!) 170/85 137/84 (!) 141/77 (!) 180/90  Pulse: 67  73   Resp: 19  19   Temp: 98 F (36.7 C)  98.2 F (36.8 C)   TempSrc: Oral  Oral   SpO2: 100%  99%   Weight:   77.3 kg   Height:        Intake/Output Summary (Last 24 hours) at 04/10/2019 0904 Last data filed at 04/10/2019 0509 Gross per 24 hour  Intake 360 ml  Output 510 ml  Net -150 ml   Filed Weights   04/09/19 1538 04/09/19 2010 04/10/19 0506  Weight: 78.5 kg 77.3 kg 77.3 kg    Telemetry    Unable to review telemetry down in nuclear medicine but in normal sinus rhythm throughout [place] study.  - Personally Reviewed  ECG    No new ECG tracings. EKG from yesterday shows normal sinus rhythm with T wave inversion in inferior leads and leads V4-V5 as well as LVH.  - Personally Reviewed  Physical Exam   General: 73 y.o. African-American. male resting comfortably in no acute distress. HEENT: Normocephalic and atraumatic. Neck: Supple.  Heart: RRR. Distinct S1 and S2. No murmurs, gallops, or rubs. Radial and distal pedal pulses 2+ and  equal bilaterally. Lungs: No increased work of breathing. Clear to ausculation bilaterally. No wheezes, rhonchi, or rales.  Abdomen: Soft, non-distended, and non-tender to palpation. Bowel sounds present. MSK: Normal strength and tone for age. Extremities: No clubbing, cyanosis, or edema.    Skin: Warm and dry. Neuro: Alert and oriented x3. No focal deficits. Psych: Normal affect. Responds appropriately.  Labs    Chemistry Recent Labs  Lab 04/09/19 1134 04/09/19 2028  NA 139  --   K 4.1  --   CL 106  --   CO2 26  --   GLUCOSE 107*  --   BUN 11  --   CREATININE 1.18 1.02  CALCIUM 9.5  --   GFRNONAA >60 >60  GFRAA >60 >60  ANIONGAP 7  --      Hematology Recent Labs  Lab 04/09/19 1134 04/09/19 2028  WBC 5.6 5.4  RBC 4.80 4.61  HGB 15.2 14.9  HCT 44.9 42.3  MCV 93.5 91.8  MCH 31.7 32.3  MCHC 33.9 35.2  RDW 12.6 12.7  PLT 257 305    Cardiac EnzymesNo results for input(s): TROPONINI in the last 168 hours. No results for input(s): TROPIPOC in the last 168 hours.   BNPNo results for input(s): BNP, PROBNP in the last 168 hours.  DDimer No results for input(s): DDIMER in the last 168 hours.   Radiology    Dg Chest 2 View  Result Date: 04/09/2019 CLINICAL DATA:  Chest pain EXAM: CHEST - 2 VIEW COMPARISON:  03/25/2019 FINDINGS: Lungs are adequately inflated without consolidation or effusion. Cardiomediastinal silhouette and remainder of the exam is unchanged. IMPRESSION: No active cardiopulmonary disease. Electronically Signed   By: Marin Olp M.D.   On: 04/09/2019 12:49    Cardiac Studies   Lexiscan Myoview.   Defect 1: There is a large defect of severe severity present in the basal inferoseptal, basal inferior, basal inferolateral, mid inferoseptal, mid inferior, mid inferolateral, apical inferior, apical lateral and apex location.  Defect 2: There is a large defect present in the basal anterior, basal anteroseptal, mid anterior, mid anteroseptal and apex  location.  Findings consistent with ischemia and prior myocardial infarction.  This is a high risk study.  The left ventricular ejection fraction is mildly decreased (45-54%).  Nuclear stress EF: 46%.  Horizontal ST segment depression ST segment depression of 2 mm was noted during stress in the II, III and aVF leads, beginning at 1 minutes of stress, and returning to baseline after 5-9 minutes of recovery.  T wave inversion was noted during stress in the II, III, aVF, V4, V5 and V6 leads. T wave inversion persisted.   Patient Profile     73 y.o. male with history of nonobstructive CAD, HTN, tobacco abuse seen for evaluation of chest pain.   Assessment & Plan    Chest Pain  - EKG shows T wave inversions in inferior leads and leads V4-V5. - High-sensitivity troponin minimally elevated and flat at 69 >> 84 >> 58 >> 49. - Completed stress portion of Lexiscan without any problems. Did have 2-3 mm of ST depression in inferior leads and 1 mm ST depression in V4-V5 after Lexiscan infusion as well as very brief ST elevation in leads aVR and aVL that quickly returned to baseline. No chest pain. - Final results of Lexiscan pending.  - Continue Aspirin and statin.  Hypertension - BP very elevated. As high as 195/111 during Lexiscan. Most recent BP 170/101. However, patient has not received any medications today yet. He has also not been taking home medications the last few months. - Will increase Amlodipine to 10mg  daily.  Tobacco Abuse - Continue to recommend complete cessation.  For questions or updates, please contact [place] Please consult [email or web address] for contact info under Cardiology/STEMI.   Signed, Darreld Mclean, PA-C  04/10/2019, 9:04 AM    History and all data above reviewed.  Patient examined.  I agree with the findings as above. No further chest pain.  No SOB.  Stress test as above.The patient exam reveals COR:RRR  ,  Lungs: Clear  ,  Abd: Positive bowel sounds,  no rebound no guarding, Ext No edema  .  All available labs, radiology testing, previous records reviewed. Agree with documented assessment and plan.   Chest pain:  Abnormal stress test as above.  The anterior wall defect is new.  Plan for cardiac cath on Monday. The patient understands that risks included but are not limited to stroke (1 in 1000), death (1 in 84), kidney failure [usually temporary] (1 in 500), bleeding (1 in 200), allergic reaction [possibly serious] (1 in 200).  The patient understands and agrees to proceed.     Steven Lowery  12:51 PM  04/10/2019

## 2019-04-11 LAB — LIPID PANEL
Cholesterol: 149 mg/dL (ref 0–200)
HDL: 73 mg/dL (ref >40)
LDL Cholesterol: 69 mg/dL (ref 0–99)
Total CHOL/HDL Ratio: 2 RATIO
Triglycerides: 37 mg/dL (ref <150)
VLDL: 7 mg/dL (ref 0–40)

## 2019-04-11 LAB — BASIC METABOLIC PANEL
Anion gap: 9 (ref 5–15)
BUN: 11 mg/dL (ref 8–23)
CO2: 27 mmol/L (ref 22–32)
Calcium: 9.2 mg/dL (ref 8.9–10.3)
Chloride: 104 mmol/L (ref 98–111)
Creatinine, Ser: 1.13 mg/dL (ref 0.61–1.24)
GFR calc Af Amer: >60 mL/min (ref >60)
GFR calc non Af Amer: >60 mL/min (ref >60)
Glucose, Bld: 103 mg/dL — ABNORMAL HIGH (ref 70–99)
Potassium: 4.2 mmol/L (ref 3.5–5.1)
Sodium: 140 mmol/L (ref 135–145)

## 2019-04-11 LAB — HEMOGLOBIN A1C
Hgb A1c MFr Bld: 5.9 % — ABNORMAL HIGH (ref 4.8–5.6)
Mean Plasma Glucose: 122.63 mg/dL

## 2019-04-11 MED ORDER — SODIUM CHLORIDE 0.9 % IV SOLN: 250.0000 mL | INTRAVENOUS | Status: DC | PRN

## 2019-04-11 MED ORDER — SODIUM CHLORIDE 0.9 % WEIGHT BASED INFUSION
1.0000 mL/kg/h | INTRAVENOUS | Status: DC
  Administered 2019-04-12: 1 mL/kg/h via INTRAVENOUS

## 2019-04-11 MED ORDER — SODIUM CHLORIDE 0.9% FLUSH: 3.0000 mL | INTRAVENOUS | Status: DC | PRN

## 2019-04-11 MED ORDER — SODIUM CHLORIDE 0.9% FLUSH
3.0000 mL | Freq: Two times a day (BID) | INTRAVENOUS | Status: DC
  Administered 2019-04-11 – 2019-04-12 (×2): 3 mL via INTRAVENOUS

## 2019-04-11 MED ORDER — POLYETHYLENE GLYCOL 3350 17 G PO PACK
17.0000 g | PACK | Freq: Every day | ORAL | Status: DC
  Administered 2019-04-11 – 2019-04-13 (×2): 17 g via ORAL
  Filled 2019-04-11 (×2): qty 1

## 2019-04-11 MED ORDER — ASPIRIN 81 MG PO CHEW
81.0000 mg | CHEWABLE_TABLET | Freq: Once | ORAL | Status: AC
  Administered 2019-04-12: 81 mg via ORAL
  Filled 2019-04-11: qty 1

## 2019-04-11 MED ORDER — SODIUM CHLORIDE 0.9 % WEIGHT BASED INFUSION
3.0000 mL/kg/h | INTRAVENOUS | Status: DC
  Administered 2019-04-12: 3 mL/kg/h via INTRAVENOUS

## 2019-04-11 MED ORDER — ASPIRIN 81 MG PO CHEW: 81.0000 mg | CHEWABLE_TABLET | Freq: Once | ORAL | Status: DC

## 2019-04-11 NOTE — Progress Notes (Signed)
Progress Note  Patient Name: Steven Lowery Date of Encounter: 04/11/2019  Primary Cardiologist:   No primary care provider on file.   Subjective   No chest pain.  No SOB.  Constipated.   Inpatient Medications    Scheduled Meds:  amLODipine  10 mg Oral Daily   aspirin EC  81 mg Oral Daily   atorvastatin  40 mg Oral q1800   influenza vaccine adjuvanted  0.5 mL Intramuscular Tomorrow-1000   pneumococcal 23 valent vaccine  0.5 mL Intramuscular Tomorrow-1000   tamsulosin  0.4 mg Oral QPC supper   Continuous Infusions:  PRN Meds: acetaminophen, ondansetron (ZOFRAN) IV   Vital Signs    Vitals:   04/10/19 0918 04/10/19 1726 04/10/19 2034 04/11/19 0454  BP: (!) 170/101 (!) 147/82 (!) 147/82 137/90  Pulse:  78 68 71  Resp:  20 20 18   Temp:  98.3 F (36.8 C) 98.2 F (36.8 C) 97.8 F (36.6 C)  TempSrc:  Oral Oral Oral  SpO2:  100% 99% 98%  Weight:    77.1 kg  Height:        Intake/Output Summary (Last 24 hours) at 04/11/2019 0748 Last data filed at 04/10/2019 1727 Gross per 24 hour  Intake 240 ml  Output 750 ml  Net -510 ml   Filed Weights   04/09/19 2010 04/10/19 0506 04/11/19 0454  Weight: 77.3 kg 77.3 kg 77.1 kg    Telemetry    NSR - Personally Reviewed  ECG    NA - Personally Reviewed  Physical Exam   GEN: No acute distress.   Neck: No  JVD Cardiac: RRR, no murmurs, rubs, or gallops.  Respiratory: Clear  to auscultation bilaterally. GI: Soft, nontender, non-distended  MS: No  edema; No deformity. Neuro:  Nonfocal  Psych: Normal affect   Labs    Chemistry Recent Labs  Lab 04/09/19 1134 04/09/19 2028 04/11/19 0443  NA 139  --  140  K 4.1  --  4.2  CL 106  --  104  CO2 26  --  27  GLUCOSE 107*  --  103*  BUN 11  --  11  CREATININE 1.18 1.02 1.13  CALCIUM 9.5  --  9.2  GFRNONAA >60 >60 >60  GFRAA >60 >60 >60  ANIONGAP 7  --  9     Hematology Recent Labs  Lab 04/09/19 1134 04/09/19 2028  WBC 5.6 5.4  RBC 4.80 4.61  HGB  15.2 14.9  HCT 44.9 42.3  MCV 93.5 91.8  MCH 31.7 32.3  MCHC 33.9 35.2  RDW 12.6 12.7  PLT 257 305    Cardiac EnzymesNo results for input(s): TROPONINI in the last 168 hours. No results for input(s): TROPIPOC in the last 168 hours.   BNPNo results for input(s): BNP, PROBNP in the last 168 hours.   DDimer No results for input(s): DDIMER in the last 168 hours.   Radiology    Dg Chest 2 View  Result Date: 04/09/2019 CLINICAL DATA:  Chest pain EXAM: CHEST - 2 VIEW COMPARISON:  03/25/2019 FINDINGS: Lungs are adequately inflated without consolidation or effusion. Cardiomediastinal silhouette and remainder of the exam is unchanged. IMPRESSION: No active cardiopulmonary disease. Electronically Signed   By: Marin Olp M.D.   On: 04/09/2019 12:49   Nm Myocar Multi W/spect W/wall Motion / Ef  Result Date: 04/10/2019  Defect 1: There is a large defect of severe severity present in the basal inferoseptal, basal inferior, basal inferolateral, mid inferoseptal, mid inferior,  mid inferolateral, apical inferior, apical lateral and apex location.  Defect 2: There is a large defect present in the basal anterior, basal anteroseptal, mid anterior, mid anteroseptal and apex location.  Findings consistent with ischemia and prior myocardial infarction.  This is a high risk study.  The left ventricular ejection fraction is mildly decreased (45-54%).  Nuclear stress EF: 46%.  Horizontal ST segment depression ST segment depression of 2 mm was noted during stress in the II, III and aVF leads, beginning at 1 minutes of stress, and returning to baseline after 5-9 minutes of recovery.  T wave inversion was noted during stress in the II, III, aVF, V4, V5 and V6 leads. T wave inversion persisted.  There is a large fixed perfusion defect in the inferoseptum, inferior wall, and inferolateral wall from apex to base.  This suggests prior myocardial infarction.  Similar to what is described on the prior stress test from  2012. There is a large perfusion defect with partial reversibility in the anterior wall, anteroseptum, and apex.  At the apex the defect is primarily fixed, with reversibility from absent to severe in the anteroapex.  At the mid ventricle the anterior wall is is partially reversible from moderate to mild and at the base is reversible from moderate to normal.  The anteroseptum at the mid ventricle has a severe perfusion defect at stress reversible to moderate at the mid ventricle and mild at the base.  The study is positive for ischemia with partial reversibility suggesting possible peri-infarct ischemia. Electrocardiogram had baseline T wave changes inferiorly and laterally, ST segments in these areas worsened with 2 mm of depression in aVF and 1 mm in 2, 3, V4, V5.  The patient's blood pressure was elevated with a systolic blood pressure 0000000 mmHg with stress. Ejection fraction is reduced at 46% confirmed with wall motion abnormalities inferiorly and in the anteroseptum. High risk stress test findings given previous infarct, peri-infarct ischemia, and reduced ejection fraction as well as stress ECG findings.    Cardiac Studies   Lexiscan Myoview.   Defect 1: There is a large defect of severe severity present in the basal inferoseptal, basal inferior, basal inferolateral, mid inferoseptal, mid inferior, mid inferolateral, apical inferior, apical lateral and apex location.  Defect 2: There is a large defect present in the basal anterior, basal anteroseptal, mid anterior, mid anteroseptal and apex location.  Findings consistent with ischemia and prior myocardial infarction.  This is a high risk study.  The left ventricular ejection fraction is mildly decreased (45-54%).  Nuclear stress EF: 46%.  Horizontal ST segment depression ST segment depression of 2 mm was noted during stress in the II, III and aVF leads, beginning at 1 minutes of stress, and returning to baseline after 5-9 minutes of  recovery.  T wave inversion was noted during stress in the II, III, aVF, V4, V5 and V6 leads. T wave inversion persisted.  Patient Profile     73 y.o. male with history of nonobstructive CAD, HTN, tobacco abuse seen for evaluation of chest pain.   Assessment & Plan    CHEST PAIN:  Abnormal stress test.  Plan cath on Monday.  The patient understands that risks included but are not limited to stroke (1 in 1000), death (1 in 21), kidney failure [usually temporary] (1 in 500), bleeding (1 in 200), allergic reaction [possibly serious] (1 in 200).  The patient understands and agrees to proceed.   OK to go home if no intervention.  HTN:  BP has been controlled this admission.  If it creeps up he would be a good beta blocker candidate.    TOBACCO ABUSE:  Educated.       For questions or updates, please contact [place] Please consult [email or web address] for contact info under Cardiology/STEMI.   Signed, Minus Breeding, MD  04/11/2019, 7:48 AM

## 2019-04-12 ENCOUNTER — Encounter (HOSPITAL_COMMUNITY): Payer: Self-pay | Admitting: Cardiovascular Disease

## 2019-04-12 ENCOUNTER — Encounter (HOSPITAL_COMMUNITY)
Admission: EM | Disposition: A | Discharge status: Home / Self Care | Payer: Self-pay | Source: Home / Self Care | Attending: Cardiology

## 2019-04-12 DIAGNOSIS — I2510 Atherosclerotic heart disease of native coronary artery without angina pectoris: Secondary | ICD-10-CM

## 2019-04-12 DIAGNOSIS — I214 Non-ST elevation (NSTEMI) myocardial infarction: Principal | ICD-10-CM

## 2019-04-12 DIAGNOSIS — I200 Unstable angina: Secondary | ICD-10-CM

## 2019-04-12 HISTORY — PX: LEFT HEART CATH AND CORONARY ANGIOGRAPHY: CATH118249

## 2019-04-12 HISTORY — PX: CORONARY STENT INTERVENTION: CATH118234

## 2019-04-12 LAB — POCT ACTIVATED CLOTTING TIME
Activated Clotting Time: 285 seconds
Activated Clotting Time: 246 seconds

## 2019-04-12 SURGERY — LEFT HEART CATH AND CORONARY ANGIOGRAPHY
Anesthesia: LOCAL

## 2019-04-12 MED ORDER — HEPARIN (PORCINE) IN NACL 1000-0.9 UT/500ML-% IV SOLN
INTRAVENOUS | Status: DC | PRN
  Administered 2019-04-12 (×2): 500 mL

## 2019-04-12 MED ORDER — TICAGRELOR 90 MG PO TABS
ORAL_TABLET | ORAL | Status: DC | PRN
  Administered 2019-04-12: 180 mg via ORAL

## 2019-04-12 MED ORDER — IOHEXOL 350 MG/ML SOLN
INTRAVENOUS | Status: DC | PRN
  Administered 2019-04-12: 150 mL via INTRA_ARTERIAL

## 2019-04-12 MED ORDER — ONDANSETRON HCL 4 MG/2ML IJ SOLN
4.0000 mg | Freq: Four times a day (QID) | INTRAMUSCULAR | Status: DC | PRN
  Administered 2019-04-12: 4 mg via INTRAVENOUS
  Filled 2019-04-12: qty 2

## 2019-04-12 MED ORDER — LIDOCAINE HCL (PF) 1 % IJ SOLN
INTRAMUSCULAR | Status: DC | PRN
  Administered 2019-04-12: 2 mL

## 2019-04-12 MED ORDER — HYDRALAZINE HCL 20 MG/ML IJ SOLN: 10.0000 mg | INTRAMUSCULAR | Status: AC | PRN

## 2019-04-12 MED ORDER — NITROGLYCERIN 1 MG/10 ML FOR IR/CATH LAB
INTRA_ARTERIAL | Status: AC
  Filled 2019-04-12: qty 10

## 2019-04-12 MED ORDER — TICAGRELOR 90 MG PO TABS
ORAL_TABLET | ORAL | Status: AC
  Filled 2019-04-12: qty 2

## 2019-04-12 MED ORDER — VERAPAMIL HCL 2.5 MG/ML IV SOLN
INTRAVENOUS | Status: AC
  Filled 2019-04-12: qty 2

## 2019-04-12 MED ORDER — HEPARIN (PORCINE) IN NACL 1000-0.9 UT/500ML-% IV SOLN
INTRAVENOUS | Status: AC
  Filled 2019-04-12: qty 1000

## 2019-04-12 MED ORDER — AMLODIPINE BESYLATE 10 MG PO TABS: 10.0000 mg | ORAL_TABLET | Freq: Every day | ORAL | Status: DC

## 2019-04-12 MED ORDER — SODIUM CHLORIDE 0.9 % IV SOLN
INTRAVENOUS | Status: AC
  Administered 2019-04-12: 17:00:00 via INTRAVENOUS

## 2019-04-12 MED ORDER — TICAGRELOR 90 MG PO TABS
90.0000 mg | ORAL_TABLET | Freq: Two times a day (BID) | ORAL | Status: DC
  Administered 2019-04-13: 90 mg via ORAL
  Filled 2019-04-12 (×2): qty 1

## 2019-04-12 MED ORDER — ASPIRIN EC 81 MG PO TBEC: 81.0000 mg | DELAYED_RELEASE_TABLET | Freq: Every day | ORAL | Status: DC

## 2019-04-12 MED ORDER — LIDOCAINE HCL (PF) 1 % IJ SOLN
INTRAMUSCULAR | Status: AC
  Filled 2019-04-12: qty 30

## 2019-04-12 MED ORDER — HEPARIN SODIUM (PORCINE) 1000 UNIT/ML IJ SOLN
INTRAMUSCULAR | Status: AC
  Filled 2019-04-12: qty 1

## 2019-04-12 MED ORDER — SODIUM CHLORIDE 0.9% FLUSH
3.0000 mL | Freq: Two times a day (BID) | INTRAVENOUS | Status: DC
  Administered 2019-04-12: 3 mL via INTRAVENOUS

## 2019-04-12 MED ORDER — ALBUTEROL SULFATE (2.5 MG/3ML) 0.083% IN NEBU: 3.0000 mL | INHALATION_SOLUTION | Freq: Four times a day (QID) | RESPIRATORY_TRACT | Status: DC | PRN

## 2019-04-12 MED ORDER — TAMSULOSIN HCL 0.4 MG PO CAPS: 0.4000 mg | ORAL_CAPSULE | Freq: Every day | ORAL | Status: DC

## 2019-04-12 MED ORDER — HEPARIN SODIUM (PORCINE) 1000 UNIT/ML IJ SOLN
INTRAMUSCULAR | Status: DC | PRN
  Administered 2019-04-12: 4000 [IU] via INTRAVENOUS
  Administered 2019-04-12: 5000 [IU] via INTRAVENOUS

## 2019-04-12 MED ORDER — ACETAMINOPHEN 325 MG PO TABS: 650.0000 mg | ORAL_TABLET | ORAL | Status: DC | PRN

## 2019-04-12 MED ORDER — SODIUM CHLORIDE 0.9 % IV SOLN: 250.0000 mL | INTRAVENOUS | Status: DC | PRN

## 2019-04-12 MED ORDER — LABETALOL HCL 5 MG/ML IV SOLN: 10.0000 mg | INTRAVENOUS | Status: AC | PRN

## 2019-04-12 MED ORDER — ASPIRIN 81 MG PO CHEW: 81.0000 mg | CHEWABLE_TABLET | Freq: Every day | ORAL | Status: DC

## 2019-04-12 MED ORDER — MORPHINE SULFATE (PF) 2 MG/ML IV SOLN
2.0000 mg | INTRAVENOUS | Status: DC | PRN
  Administered 2019-04-12: 2 mg via INTRAVENOUS
  Filled 2019-04-12: qty 1

## 2019-04-12 MED ORDER — VERAPAMIL HCL 2.5 MG/ML IV SOLN
INTRA_ARTERIAL | Status: DC | PRN
  Administered 2019-04-12: 10 mL via INTRA_ARTERIAL

## 2019-04-12 MED ORDER — SODIUM CHLORIDE 0.9% FLUSH: 3.0000 mL | INTRAVENOUS | Status: DC | PRN

## 2019-04-12 SURGICAL SUPPLY — 19 items
BALLN SAPPHIRE 2.0X12 (BALLOONS) ×2
BALLN SAPPHIRE ~~LOC~~ 3.5X15 (BALLOONS) ×1 IMPLANT
BALLOON SAPPHIRE 2.0X12 (BALLOONS) IMPLANT
CATH INFINITI 5FR ANG PIGTAIL (CATHETERS) ×1 IMPLANT
CATH OPTITORQUE TIG 4.0 5F (CATHETERS) ×1 IMPLANT
CATH VISTA GUIDE 6FR XBLAD3.5 (CATHETERS) ×1 IMPLANT
DEVICE RAD COMP TR BAND LRG (VASCULAR PRODUCTS) ×1 IMPLANT
GLIDESHEATH SLEND A-KIT 6F 22G (SHEATH) ×1 IMPLANT
GUIDEWIRE INQWIRE 1.5J.035X260 (WIRE) IMPLANT
INQWIRE 1.5J .035X260CM (WIRE) ×2
KIT ENCORE 26 ADVANTAGE (KITS) ×1 IMPLANT
KIT HEART LEFT (KITS) ×2 IMPLANT
PACK CARDIAC CATHETERIZATION (CUSTOM PROCEDURE TRAY) ×2 IMPLANT
STENT SYNERGY DES 3X20 (Permanent Stent) ×1 IMPLANT
SYR MEDRAD MARK 7 150ML (SYRINGE) ×2 IMPLANT
TRANSDUCER W/STOPCOCK (MISCELLANEOUS) ×2 IMPLANT
TUBING CIL FLEX 10 FLL-RA (TUBING) ×2 IMPLANT
WIRE ASAHI PROWATER 180CM (WIRE) ×1 IMPLANT
WIRE HI TORQ VERSACORE-J 145CM (WIRE) ×1 IMPLANT

## 2019-04-12 NOTE — Progress Notes (Signed)
[place] to notified nurse @ 2214 to report hr dropped into the 30's at change of shift .

## 2019-04-12 NOTE — Progress Notes (Addendum)
Progress Note  Patient Name: Steven Lowery Date of Encounter: 04/12/2019  Primary Cardiologist: Peter Martinique, MD   Subjective   Denies chest pain.  Inpatient Medications    Scheduled Meds:  amLODipine  10 mg Oral Daily   aspirin EC  81 mg Oral Daily   atorvastatin  40 mg Oral q1800   influenza vaccine adjuvanted  0.5 mL Intramuscular Tomorrow-1000   pneumococcal 23 valent vaccine  0.5 mL Intramuscular Tomorrow-1000   polyethylene glycol  17 g Oral Daily   sodium chloride flush  3 mL Intravenous Q12H   tamsulosin  0.4 mg Oral QPC supper   Continuous Infusions:  sodium chloride     sodium chloride 1 mL/kg/hr (04/12/19 0218)   PRN Meds: sodium chloride, acetaminophen, ondansetron (ZOFRAN) IV, sodium chloride flush   Vital Signs    Vitals:   04/11/19 0811 04/11/19 1441 04/11/19 2001 04/12/19 0450  BP: (!) 143/78 (!) 155/90 124/88 119/84  Pulse:  72 67 77  Resp:   15 16  Temp:  98.1 F (36.7 C) 98.1 F (36.7 C) 97.6 F (36.4 C)  TempSrc:  Oral Oral Oral  SpO2:  98% 98% 98%  Weight:    76.9 kg  Height:        Intake/Output Summary (Last 24 hours) at 04/12/2019 0750 Last data filed at 04/12/2019 0700 Gross per 24 hour  Intake 1325.71 ml  Output 1525 ml  Net -199.29 ml   Last 3 Weights 04/12/2019 04/11/2019 04/10/2019  Weight (lbs) 169 lb 8 oz 170 lb 170 lb 6.7 oz  Weight (kg) 76.885 kg 77.111 kg 77.3 kg      Telemetry    Sinus in the 60s - Personally Reviewed  ECG    No new tracing - Personally Reviewed  Physical Exam   GEN: No acute distress.   Neck: No JVD Cardiac: RRR, no murmurs, rubs, or gallops.  Respiratory: Clear to auscultation bilaterally. GI: Soft, nontender, non-distended  MS: No edema; No deformity. Neuro:  Nonfocal  Psych: Normal affect   Labs    High Sensitivity Troponin:   Recent Labs  Lab 04/09/19 1134 04/09/19 1508 04/09/19 2028 04/09/19 2153  TROPONINIHS 69* 84* 58* 49*      Chemistry Recent Labs  Lab  04/09/19 1134 04/09/19 2028 04/11/19 0443  NA 139  --  140  K 4.1  --  4.2  CL 106  --  104  CO2 26  --  27  GLUCOSE 107*  --  103*  BUN 11  --  11  CREATININE 1.18 1.02 1.13  CALCIUM 9.5  --  9.2  GFRNONAA >60 >60 >60  GFRAA >60 >60 >60  ANIONGAP 7  --  9     Hematology Recent Labs  Lab 04/09/19 1134 04/09/19 2028  WBC 5.6 5.4  RBC 4.80 4.61  HGB 15.2 14.9  HCT 44.9 42.3  MCV 93.5 91.8  MCH 31.7 32.3  MCHC 33.9 35.2  RDW 12.6 12.7  PLT 257 305    BNPNo results for input(s): BNP, PROBNP in the last 168 hours.   DDimer No results for input(s): DDIMER in the last 168 hours.   Radiology    Nm Myocar Multi W/spect W/wall Motion / Ef  Result Date: 04/10/2019  Defect 1: There is a large defect of severe severity present in the basal inferoseptal, basal inferior, basal inferolateral, mid inferoseptal, mid inferior, mid inferolateral, apical inferior, apical lateral and apex location.  Defect 2: There is a  large defect present in the basal anterior, basal anteroseptal, mid anterior, mid anteroseptal and apex location.  Findings consistent with ischemia and prior myocardial infarction.  This is a high risk study.  The left ventricular ejection fraction is mildly decreased (45-54%).  Nuclear stress EF: 46%.  Horizontal ST segment depression ST segment depression of 2 mm was noted during stress in the II, III and aVF leads, beginning at 1 minutes of stress, and returning to baseline after 5-9 minutes of recovery.  T wave inversion was noted during stress in the II, III, aVF, V4, V5 and V6 leads. T wave inversion persisted.  There is a large fixed perfusion defect in the inferoseptum, inferior wall, and inferolateral wall from apex to base.  This suggests prior myocardial infarction.  Similar to what is described on the prior stress test from 2012. There is a large perfusion defect with partial reversibility in the anterior wall, anteroseptum, and apex.  At the apex the defect is  primarily fixed, with reversibility from absent to severe in the anteroapex.  At the mid ventricle the anterior wall is is partially reversible from moderate to mild and at the base is reversible from moderate to normal.  The anteroseptum at the mid ventricle has a severe perfusion defect at stress reversible to moderate at the mid ventricle and mild at the base.  The study is positive for ischemia with partial reversibility suggesting possible peri-infarct ischemia. Electrocardiogram had baseline T wave changes inferiorly and laterally, ST segments in these areas worsened with 2 mm of depression in aVF and 1 mm in 2, 3, V4, V5.  The patient's blood pressure was elevated with a systolic blood pressure 0000000 mmHg with stress. Ejection fraction is reduced at 46% confirmed with wall motion abnormalities inferiorly and in the anteroseptum. High risk stress test findings given previous infarct, peri-infarct ischemia, and reduced ejection fraction as well as stress ECG findings.    Cardiac Studies   Myoview stress test 04/10/19:  Defect 1: There is a large defect of severe severity present in the basal inferoseptal, basal inferior, basal inferolateral, mid inferoseptal, mid inferior, mid inferolateral, apical inferior, apical lateral and apex location.  Defect 2: There is a large defect present in the basal anterior, basal anteroseptal, mid anterior, mid anteroseptal and apex location.  Findings consistent with ischemia and prior myocardial infarction.  This is a high risk study.  The left ventricular ejection fraction is mildly decreased (45-54%).  Nuclear stress EF: 46%.  Horizontal ST segment depression ST segment depression of 2 mm was noted during stress in the II, III and aVF leads, beginning at 1 minutes of stress, and returning to baseline after 5-9 minutes of recovery.  T wave inversion was noted during stress in the II, III, aVF, V4, V5 and V6 leads. T wave inversion persisted.   There is a  large fixed perfusion defect in the inferoseptum, inferior wall, and inferolateral wall from apex to base.  This suggests prior myocardial infarction.  Similar to what is described on the prior stress test from 2012.  There is a large perfusion defect with partial reversibility in the anterior wall, anteroseptum, and apex.  At the apex the defect is primarily fixed, with reversibility from absent to severe in the anteroapex.  At the mid ventricle the anterior wall is is partially reversible from moderate to mild and at the base is reversible from moderate to normal.  The anteroseptum at the mid ventricle has a severe perfusion defect at stress  reversible to moderate at the mid ventricle and mild at the base.  The study is positive for ischemia with partial reversibility suggesting possible peri-infarct ischemia.  Electrocardiogram had baseline T wave changes inferiorly and laterally, ST segments in these areas worsened with 2 mm of depression in aVF and 1 mm in 2, 3, V4, V5.  The patient's blood pressure was elevated with a systolic blood pressure 0000000 mmHg with stress.  Ejection fraction is reduced at 46% confirmed with wall motion abnormalities inferiorly and in the anteroseptum.  High risk stress test findings given previous infarct, peri-infarct ischemia, and reduced ejection fraction as well as stress ECG findings.   Left heart cath 04/12/19: pending  Patient Profile     73 y.o. male with history of nonobstructive CAD, HTN, tobacco abuse seen for evaluation of chest pain.   Assessment & Plan    1. Chest pain - EKG with TWI inferior leads and V4-5 - hs troponin 69 --> 84 --> 58 --> 49 - abnormal stress test on 04/10/19 - plan for cardiac catheterization today - no heparin - was on 325 mg ASA at home - based on cath results, may need to initiate BB - estimated reduced EF on myoview, depending on LV gram, may need follow up echo   2. Hypertension - medication noncompliance -  norvasc 10 mg - pressures better controlled today   3. Lipid profile 04/11/2019: Cholesterol 149; HDL 73; LDL Cholesterol 69; Triglycerides 37; VLDL 7 - started on 40 mg lipitor    4. Current smoker - encouraged cessation   5. BPH - continue flomax    For questions or updates, please contact [place] Please consult [email or web address] for contact info under        Signed, Ledora Bottcher, PA  04/12/2019, 7:50 AM    I have personally seen and examined this patient. I agree with the assessment and plan as outlined above.  He is admitted with chest pain concerning for unstable angina. High risk stress test findings. Mild troponin elevation. Will plan cardiac cath today.  I have reviewed the risks, indications, and alternatives to cardiac catheterization, possible angioplasty, and stenting with the patient. Risks include but are not limited to bleeding, infection, vascular injury, stroke, myocardial infection, arrhythmia, kidney injury, radiation-related injury in the case of prolonged fluoroscopy use, emergency cardiac surgery, and death. The patient understands the risks of serious complication is 1-2 in [id number] with diagnostic cardiac cath and 1-2% or less with angioplasty/stenting.  Lauree Chandler 04/12/2019 9:18 AM

## 2019-04-12 NOTE — H&P (View-Only) (Signed)
Progress Note  Patient Name: Steven Lowery Date of Encounter: 04/12/2019  Primary Cardiologist: Peter Martinique, MD   Subjective   Denies chest pain.  Inpatient Medications    Scheduled Meds:  amLODipine  10 mg Oral Daily   aspirin EC  81 mg Oral Daily   atorvastatin  40 mg Oral q1800   influenza vaccine adjuvanted  0.5 mL Intramuscular Tomorrow-1000   pneumococcal 23 valent vaccine  0.5 mL Intramuscular Tomorrow-1000   polyethylene glycol  17 g Oral Daily   sodium chloride flush  3 mL Intravenous Q12H   tamsulosin  0.4 mg Oral QPC supper   Continuous Infusions:  sodium chloride     sodium chloride 1 mL/kg/hr (04/12/19 0218)   PRN Meds: sodium chloride, acetaminophen, ondansetron (ZOFRAN) IV, sodium chloride flush   Vital Signs    Vitals:   04/11/19 0811 04/11/19 1441 04/11/19 2001 04/12/19 0450  BP: (!) 143/78 (!) 155/90 124/88 119/84  Pulse:  72 67 77  Resp:   15 16  Temp:  98.1 F (36.7 C) 98.1 F (36.7 C) 97.6 F (36.4 C)  TempSrc:  Oral Oral Oral  SpO2:  98% 98% 98%  Weight:    76.9 kg  Height:        Intake/Output Summary (Last 24 hours) at 04/12/2019 0750 Last data filed at 04/12/2019 0700 Gross per 24 hour  Intake 1325.71 ml  Output 1525 ml  Net -199.29 ml   Last 3 Weights 04/12/2019 04/11/2019 04/10/2019  Weight (lbs) 169 lb 8 oz 170 lb 170 lb 6.7 oz  Weight (kg) 76.885 kg 77.111 kg 77.3 kg      Telemetry    Sinus in the 60s - Personally Reviewed  ECG    No new tracing - Personally Reviewed  Physical Exam   GEN: No acute distress.   Neck: No JVD Cardiac: RRR, no murmurs, rubs, or gallops.  Respiratory: Clear to auscultation bilaterally. GI: Soft, nontender, non-distended  MS: No edema; No deformity. Neuro:  Nonfocal  Psych: Normal affect   Labs    High Sensitivity Troponin:   Recent Labs  Lab 04/09/19 1134 04/09/19 1508 04/09/19 2028 04/09/19 2153  TROPONINIHS 69* 84* 58* 49*      Chemistry Recent Labs  Lab  04/09/19 1134 04/09/19 2028 04/11/19 0443  NA 139  --  140  K 4.1  --  4.2  CL 106  --  104  CO2 26  --  27  GLUCOSE 107*  --  103*  BUN 11  --  11  CREATININE 1.18 1.02 1.13  CALCIUM 9.5  --  9.2  GFRNONAA >60 >60 >60  GFRAA >60 >60 >60  ANIONGAP 7  --  9     Hematology Recent Labs  Lab 04/09/19 1134 04/09/19 2028  WBC 5.6 5.4  RBC 4.80 4.61  HGB 15.2 14.9  HCT 44.9 42.3  MCV 93.5 91.8  MCH 31.7 32.3  MCHC 33.9 35.2  RDW 12.6 12.7  PLT 257 305    BNPNo results for input(s): BNP, PROBNP in the last 168 hours.   DDimer No results for input(s): DDIMER in the last 168 hours.   Radiology    Nm Myocar Multi W/spect W/wall Motion / Ef  Result Date: 04/10/2019  Defect 1: There is a large defect of severe severity present in the basal inferoseptal, basal inferior, basal inferolateral, mid inferoseptal, mid inferior, mid inferolateral, apical inferior, apical lateral and apex location.  Defect 2: There is a  large defect present in the basal anterior, basal anteroseptal, mid anterior, mid anteroseptal and apex location.  Findings consistent with ischemia and prior myocardial infarction.  This is a high risk study.  The left ventricular ejection fraction is mildly decreased (45-54%).  Nuclear stress EF: 46%.  Horizontal ST segment depression ST segment depression of 2 mm was noted during stress in the II, III and aVF leads, beginning at 1 minutes of stress, and returning to baseline after 5-9 minutes of recovery.  T wave inversion was noted during stress in the II, III, aVF, V4, V5 and V6 leads. T wave inversion persisted.  There is a large fixed perfusion defect in the inferoseptum, inferior wall, and inferolateral wall from apex to base.  This suggests prior myocardial infarction.  Similar to what is described on the prior stress test from 2012. There is a large perfusion defect with partial reversibility in the anterior wall, anteroseptum, and apex.  At the apex the defect is  primarily fixed, with reversibility from absent to severe in the anteroapex.  At the mid ventricle the anterior wall is is partially reversible from moderate to mild and at the base is reversible from moderate to normal.  The anteroseptum at the mid ventricle has a severe perfusion defect at stress reversible to moderate at the mid ventricle and mild at the base.  The study is positive for ischemia with partial reversibility suggesting possible peri-infarct ischemia. Electrocardiogram had baseline T wave changes inferiorly and laterally, ST segments in these areas worsened with 2 mm of depression in aVF and 1 mm in 2, 3, V4, V5.  The patient's blood pressure was elevated with a systolic blood pressure 0000000 mmHg with stress. Ejection fraction is reduced at 46% confirmed with wall motion abnormalities inferiorly and in the anteroseptum. High risk stress test findings given previous infarct, peri-infarct ischemia, and reduced ejection fraction as well as stress ECG findings.    Cardiac Studies   Myoview stress test 04/10/19:  Defect 1: There is a large defect of severe severity present in the basal inferoseptal, basal inferior, basal inferolateral, mid inferoseptal, mid inferior, mid inferolateral, apical inferior, apical lateral and apex location.  Defect 2: There is a large defect present in the basal anterior, basal anteroseptal, mid anterior, mid anteroseptal and apex location.  Findings consistent with ischemia and prior myocardial infarction.  This is a high risk study.  The left ventricular ejection fraction is mildly decreased (45-54%).  Nuclear stress EF: 46%.  Horizontal ST segment depression ST segment depression of 2 mm was noted during stress in the II, III and aVF leads, beginning at 1 minutes of stress, and returning to baseline after 5-9 minutes of recovery.  T wave inversion was noted during stress in the II, III, aVF, V4, V5 and V6 leads. T wave inversion persisted.   There is a  large fixed perfusion defect in the inferoseptum, inferior wall, and inferolateral wall from apex to base.  This suggests prior myocardial infarction.  Similar to what is described on the prior stress test from 2012.  There is a large perfusion defect with partial reversibility in the anterior wall, anteroseptum, and apex.  At the apex the defect is primarily fixed, with reversibility from absent to severe in the anteroapex.  At the mid ventricle the anterior wall is is partially reversible from moderate to mild and at the base is reversible from moderate to normal.  The anteroseptum at the mid ventricle has a severe perfusion defect at stress  reversible to moderate at the mid ventricle and mild at the base.  The study is positive for ischemia with partial reversibility suggesting possible peri-infarct ischemia.  Electrocardiogram had baseline T wave changes inferiorly and laterally, ST segments in these areas worsened with 2 mm of depression in aVF and 1 mm in 2, 3, V4, V5.  The patient's blood pressure was elevated with a systolic blood pressure 0000000 mmHg with stress.  Ejection fraction is reduced at 46% confirmed with wall motion abnormalities inferiorly and in the anteroseptum.  High risk stress test findings given previous infarct, peri-infarct ischemia, and reduced ejection fraction as well as stress ECG findings.   Left heart cath 04/12/19: pending  Patient Profile     73 y.o. male with history of nonobstructive CAD, HTN, tobacco abuse seen for evaluation of chest pain.   Assessment & Plan    1. Chest pain - EKG with TWI inferior leads and V4-5 - hs troponin 69 --> 84 --> 58 --> 49 - abnormal stress test on 04/10/19 - plan for cardiac catheterization today - no heparin - was on 325 mg ASA at home - based on cath results, may need to initiate BB - estimated reduced EF on myoview, depending on LV gram, may need follow up echo   2. Hypertension - medication noncompliance -  norvasc 10 mg - pressures better controlled today   3. Lipid profile 04/11/2019: Cholesterol 149; HDL 73; LDL Cholesterol 69; Triglycerides 37; VLDL 7 - started on 40 mg lipitor    4. Current smoker - encouraged cessation   5. BPH - continue flomax    For questions or updates, please contact [facility] Please consult [email or web address] for contact info under        Signed, Ledora Bottcher, PA  04/12/2019, 7:50 AM    I have personally seen and examined this patient. I agree with the assessment and plan as outlined above.  He is admitted with chest pain concerning for unstable angina. High risk stress test findings. Mild troponin elevation. Will plan cardiac cath today.  I have reviewed the risks, indications, and alternatives to cardiac catheterization, possible angioplasty, and stenting with the patient. Risks include but are not limited to bleeding, infection, vascular injury, stroke, myocardial infection, arrhythmia, kidney injury, radiation-related injury in the case of prolonged fluoroscopy use, emergency cardiac surgery, and death. The patient understands the risks of serious complication is 1-2 in [id number] with diagnostic cardiac cath and 1-2% or less with angioplasty/stenting.  Lauree Chandler 04/12/2019 9:18 AM

## 2019-04-12 NOTE — Progress Notes (Signed)
TR band 9cc, deflation to begin at 2103hrs, level 0.  Reviewed post cath instructions on not using affected arm with patient, verbalized understanding.

## 2019-04-12 NOTE — Interval H&P Note (Signed)
Cath Lab Visit (complete for each Cath Lab visit)  Clinical Evaluation Leading to the Procedure:   ACS: Yes.    Non-ACS:    Anginal Classification: CCS III  Anti-ischemic medical therapy: Minimal Therapy (1 class of medications)  Non-Invasive Test Results: High-risk stress test findings: cardiac mortality >3%/year  Prior CABG: No previous CABG      History and Physical Interval Note:  04/12/2019 3:44 PM  Steven Lowery  has presented today for surgery, with the diagnosis of unstable angina.  The various methods of treatment have been discussed with the patient and family. After consideration of risks, benefits and other options for treatment, the patient has consented to  Procedure(s): LEFT HEART CATH AND CORONARY ANGIOGRAPHY (N/A) as a surgical intervention.  The patient's history has been reviewed, patient examined, no change in status, stable for surgery.  I have reviewed the patient's chart and labs.  Questions were answered to the patient's satisfaction.     Quay Burow

## 2019-04-12 NOTE — Progress Notes (Signed)
Patient back from cath lab at 1715hrs.  R radial site with  TR band 12cc, deflation to begin at 1850hrs, level 0.  Reviewed post cath instructions with patient, verbalized understanding.

## 2019-04-13 LAB — BASIC METABOLIC PANEL
Anion gap: 8 (ref 5–15)
BUN: 10 mg/dL (ref 8–23)
CO2: 24 mmol/L (ref 22–32)
Calcium: 9.1 mg/dL (ref 8.9–10.3)
Chloride: 106 mmol/L (ref 98–111)
Creatinine, Ser: 1.02 mg/dL (ref 0.61–1.24)
GFR calc Af Amer: >60 mL/min (ref >60)
GFR calc non Af Amer: >60 mL/min (ref >60)
Glucose, Bld: 99 mg/dL (ref 70–99)
Potassium: 4.2 mmol/L (ref 3.5–5.1)
Sodium: 138 mmol/L (ref 135–145)

## 2019-04-13 LAB — CBC
HCT: 43.2 % (ref 39.0–52.0)
Hemoglobin: 15.3 g/dL (ref 13.0–17.0)
MCH: 32.5 pg (ref 26.0–34.0)
MCHC: 35.4 g/dL (ref 30.0–36.0)
MCV: 91.7 fL (ref 80.0–100.0)
Platelets: 252 10*3/uL (ref 150–400)
RBC: 4.71 MIL/uL (ref 4.22–5.81)
RDW: 12 % (ref 11.5–15.5)
WBC: 5.2 10*3/uL (ref 4.0–10.5)
nRBC: 0 % (ref 0.0–0.2)

## 2019-04-13 MED ORDER — LOSARTAN POTASSIUM 25 MG PO TABS: 25.0000 mg | ORAL_TABLET | Freq: Every day | ORAL | 3 refills | Status: DC

## 2019-04-13 MED ORDER — TICAGRELOR 90 MG PO TABS
90.0000 mg | ORAL_TABLET | Freq: Two times a day (BID) | ORAL | Status: DC
  Administered 2019-04-13: 90 mg via ORAL

## 2019-04-13 MED ORDER — TICAGRELOR 90 MG PO TABS: 90.0000 mg | ORAL_TABLET | Freq: Two times a day (BID) | ORAL | 3 refills | Status: DC

## 2019-04-13 MED ORDER — ATORVASTATIN CALCIUM 40 MG PO TABS: 40.0000 mg | ORAL_TABLET | Freq: Every day | ORAL | 3 refills | Status: AC

## 2019-04-13 MED ORDER — LOSARTAN POTASSIUM 25 MG PO TABS
25.0000 mg | ORAL_TABLET | Freq: Every day | ORAL | Status: DC
  Administered 2019-04-13: 25 mg via ORAL
  Filled 2019-04-13: qty 1

## 2019-04-13 MED FILL — LOSARTAN POTASSIUM 25 MG TA: 25 | 90 days supply | Qty: 90 | Fill #0

## 2019-04-13 MED FILL — ATORVASTATIN CALCIUM 40 MG: 40 | 90 days supply | Qty: 90 | Fill #0

## 2019-04-13 MED FILL — BRILINTA 90 MG TABLET: 90 | 90 days supply | Qty: 180 | Fill #0

## 2019-04-13 NOTE — TOC Benefit Eligibility Note (Signed)
Transition of Care [facility]) Benefit Eligibility Note    Patient Details  Name: NAJEH CREDIT MRN: [id number] Date of Birth: [birth year]   Medication/Dose: BRILINTA  90 MG BID  Covered?: Yes  Tier: 3 Drug  Prescription Coverage Preferred Pharmacy: [facility]  Spoke with Person/Company/Phone Number:: MAE  @ [place] [employer] # [phone]  Co-Pay: $3.90   Q/L TWO PILL PER DAY  Prior Approval: No  Deductible: Met       Memory Argue Phone Number: 04/13/2019, 11:50 AM

## 2019-04-13 NOTE — Progress Notes (Signed)
CARDIAC REHAB PHASE I   PRE:  Rate/Rhythm: 87 SR up in room    BP: sitting 136/80    SaO2:   MODE:  Ambulation: 340 ft   POST:  Rate/Rhythm: 87 SR    BP: sitting 142/84     SaO2:   Pt without problems this am. Feels well. Discussed stent, Brilinta, restrictions, smoking cessation, diet, exercise, NTG, and CRPII. He understands the importance of Brilinta. He is planning to quit smoking. Will refer to [place]. No device for virtual.  [id number]   [place], ACSM 04/13/2019 9:59 AM

## 2019-04-13 NOTE — Care Management Important Message (Signed)
Important Message  Patient Details  Name: Steven Lowery MRN: [id number] Date of Birth: [birth year]   Medicare Important Message Given:  Yes     Shelda Altes 04/13/2019, 1:01 PM

## 2019-04-13 NOTE — Discharge Instructions (Signed)
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
## 2019-04-19 ENCOUNTER — Other Ambulatory Visit: Payer: Self-pay | Admitting: Family Medicine

## 2019-04-19 DIAGNOSIS — N401 Enlarged prostate with lower urinary tract symptoms: Secondary | ICD-10-CM

## 2019-04-23 NOTE — Progress Notes (Addendum)
Cardiology Office Note:    Date:  04/26/2019   ID:  Steven Lowery, DOB 11/30/1945, MRN [id number]  PCP:  Tresa Garter, MD  Cardiologist:  Peter Martinique, MD  Electrophysiologist:  None   Referring MD: Lanae Boast, FNP   Chief Complaint: hospital follow-up for NSTEMI  History of Present Illness:    Steven Lowery is a 73 y.o. male with a history of CAD s/p recent NSTEMI s/p DES to LAD on 04/12/2019, hypertension, prediabetes, asthma, tobacco abuse, and alcohol abuse who presents today for hospital follow-up for NSTEMI.  Patient admitted from 04/09/2019 to 04/13/2019 for NSTEMI after presenting with a 3 day history of non-radiating chest pain. Troponin mildly elevated and peaked at 84 but remained relatively flat. Therefore, he underwent Lexiscan Myoview on 04/10/2019 which showed a large fixed perfusion defect in inferior portions of myocardium suggestive of prior MI and a large perfusion defect with partial reversibility in the anterior wall, anteroseptum, and apex suggestive of ischemia. Considered high risk study so cardiac catheterization was performed on 04/12/2019 and showed 90% stenosis of mid LAD. LV systolic function and LVEDP were normal. Patient underwent successful PCI with DES to this lesion. He tolerated the procedure well but later that evening he did develop brief episodes of bradycardia with junctional rhythm with heart rates dropping to the 30's and had associated dizziness and lightheadedness with this. Therefore, beta blocker was not added. Patient was discharged on Aspirin 81mg  daily, Brilinta 90mg  twice daily, Losartan 25mg  daily, and Lipitor 40mg  daily.   Patient presents today for for hospital follow-up. He is here alone. He reports doing well since discharge. He denies any recurrent chest pain. He does report some transient shortness of breath while at rest that sounds like it is from the [place]. He does not seem too bothered by this. He denies any palpitations,  lightheadedness, dizziness, near syncope/syncope, orthopnea, PND, or lower extremity edema. He is currently living with his sister and has been staying active around the house vacuuming and taking out the trash. No exertional symptoms with this. He is still smoking but is working on quitting. He is down from 10 to 4 cigarettes per day.  Past Medical History:  Diagnosis Date  . Acute asthmatic bronchitis   . Alcohol abuse   . Back pain   . Benign prostatic hypertrophy   . Cigarette smoker   . Coronary artery disease   . DJD (degenerative joint disease)   . Hypertension   . IBS (irritable bowel syndrome)   . Myocardial infarction [facility])    per pt he thinks he had an MI "5 years ago"  . Neck pain   . Psychiatric disorder     Past Surgical History:  Procedure Laterality Date  . CORONARY STENT INTERVENTION N/A 04/12/2019   Procedure: CORONARY STENT INTERVENTION;  Surgeon: Lorretta Harp, MD;  Location: [facility];  Service: Cardiovascular;  Laterality: N/A;  . CYSTOSCOPY  1999   Dr. Janice Norrie  . HAND SURGERY    . LEFT HEART CATH AND CORONARY ANGIOGRAPHY N/A 04/12/2019   Procedure: LEFT HEART CATH AND CORONARY ANGIOGRAPHY;  Surgeon: Lorretta Harp, MD;  Location: [place] [facility];  Service: Cardiovascular;  Laterality: N/A;  . TONSILLECTOMY      Current Medications: Current Meds  Medication Sig  . albuterol (VENTOLIN HFA) 108 (90 Base) MCG/ACT inhaler Inhale 1-2 puffs into the lungs every 6 (six) hours as needed for wheezing or shortness of breath.  . Ascorbic  Acid (VITAMIN C PO) Take 1 tablet by mouth daily.  [name] aspirin EC 81 MG tablet Take 1 tablet (81 mg total) by mouth daily.  [name] atorvastatin (LIPITOR) 40 MG tablet Take 1 tablet (40 mg total) by mouth daily at 6 PM.  . docusate sodium (COLACE) 100 MG capsule Take 100 mg by mouth daily.  . fish oil-omega-3 fatty acids 1000 MG capsule Take 1 g by mouth daily.   [name] gabapentin (NEURONTIN) 300 MG capsule Take 1 capsule (300 mg  total) by mouth 3 (three) times daily.  [name] lidocaine (XYLOCAINE) 5 % ointment Apply 1 application topically 4 (four) times daily as needed.  [name] losartan (COZAAR) 25 MG tablet Take 1 tablet (25 mg total) by mouth daily.  . Multiple Vitamin (MULTIVITAMIN WITH MINERALS) TABS tablet Take 1 tablet by mouth daily.  [name] OVER THE COUNTER MEDICATION Place 1 drop into both eyes daily as needed (Eye burning). Reported on 09/11/2015  . tamsulosin (FLOMAX) 0.4 MG CAPS capsule TAKE 1 CAPSULE BY MOUTH DAILY  . ticagrelor (BRILINTA) 90 MG TABS tablet Take 1 tablet (90 mg total) by mouth 2 (two) times daily.   Current Facility-Administered Medications for the 04/26/19 encounter (Office Visit) with Darreld Mclean, PA-C  Medication  . 0.9 %  sodium chloride infusion     Allergies:   Lisinopril   Social History   Socioeconomic History  . Marital status: Married    Spouse name: Not on file  . Number of children: Not on file  . Years of education: Not on file  . Highest education level: Not on file  Occupational History  . Not on file  Tobacco Use  . Smoking status: Current Some Day Smoker    Packs/day: 0.30    Years: 40.00    Pack years: 12.00    Types: Cigarettes  . Smokeless tobacco: Never Used  . Tobacco comment: has cut back alot per pt  Substance and Sexual Activity  . Alcohol use: Yes    Comment: quit 3 months ago  . Drug use: No  . Sexual activity: Yes  Other Topics Concern  . Not on file  Social History Narrative  . Not on file   Social Determinants of Health   Financial Resource Strain:   . Difficulty of Paying Living Expenses: Not on file  Food Insecurity:   . Worried About Charity fundraiser in the Last Year: Not on file  . Ran Out of Food in the Last Year: Not on file  Transportation Needs:   . Lack of Transportation (Medical): Not on file  . Lack of Transportation (Non-Medical): Not on file  Physical Activity:   . Days of Exercise per Week: Not on file  . Minutes of  Exercise per Session: Not on file  Stress:   . Feeling of Stress : Not on file  Social Connections:   . Frequency of Communication with Friends and Family: Not on file  . Frequency of Social Gatherings with Friends and Family: Not on file  . Attends Religious Services: Not on file  . Active Member of Clubs or Organizations: Not on file  . Attends Archivist Meetings: Not on file  . Marital Status: Not on file     Family History: The patient's family history includes Breast cancer in his sister; Heart attack in his father; Heart disease in his brother and sister. There is no history of Colon cancer, Esophageal cancer, Rectal cancer, or Stomach cancer.  ROS:  Please see the history of present illness.    All other systems reviewed and are negative.  EKGs/Labs/Other Studies Reviewed:    The following studies were reviewed today:  Lexiscan Myoview 04/10/2019:  Defect 1: There is a large defect of severe severity present in the basal inferoseptal, basal inferior, basal inferolateral, mid inferoseptal, mid inferior, mid inferolateral, apical inferior, apical lateral and apex location.  Defect 2: There is a large defect present in the basal anterior, basal anteroseptal, mid anterior, mid anteroseptal and apex location.  Findings consistent with ischemia and prior myocardial infarction.  This is a high risk study.  The left ventricular ejection fraction is mildly decreased (45-54%).  Nuclear stress EF: 46%.  Horizontal ST segment depression ST segment depression of 2 mm was noted during stress in the II, III and aVF leads, beginning at 1 minutes of stress, and returning to baseline after 5-9 minutes of recovery.  T wave inversion was noted during stress in the II, III, aVF, V4, V5 and V6 leads. T wave inversion persisted.   There is a large fixed perfusion defect in the inferoseptum, inferior wall, and inferolateral wall from apex to base.  This suggests prior myocardial  infarction.  Similar to what is described on the prior stress test from 2012.  There is a large perfusion defect with partial reversibility in the anterior wall, anteroseptum, and apex.  At the apex the defect is primarily fixed, with reversibility from absent to severe in the anteroapex.  At the mid ventricle the anterior wall is is partially reversible from moderate to mild and at the base is reversible from moderate to normal.  The anteroseptum at the mid ventricle has a severe perfusion defect at stress reversible to moderate at the mid ventricle and mild at the base.  The study is positive for ischemia with partial reversibility suggesting possible peri-infarct ischemia.  Electrocardiogram had baseline T wave changes inferiorly and laterally, ST segments in these areas worsened with 2 mm of depression in aVF and 1 mm in 2, 3, V4, V5.  The patient's blood pressure was elevated with a systolic blood pressure 0000000 mmHg with stress.  Ejection fraction is reduced at 46% confirmed with wall motion abnormalities inferiorly and in the anteroseptum.  High risk stress test findings given previous infarct, peri-infarct ischemia, and reduced ejection fraction as well as stress ECG findings. _______________  Left Heart Catheterization 04/12/2019:  Mid LAD lesion is 90% stenosed.  A drug-eluting stent was successfully placed using a STENT SYNERGY DES 3X20.  Post intervention, there is a 0% residual stenosis.  The left ventricular systolic function is normal.  LV end diastolic pressure is normal.  The left ventricular ejection fraction is 55-65% by visual estimate.  Impression: Successful mid LAD PCI and drug-eluting stenting using a synergy 3 mm x 20 mm long drug-eluting stent postdilated to 3.54 mm.  Patient has no other CAD and has normal LV function.  He can be discharged home tomorrow on dual antiplatelet therapy uninterrupted for 1 year.  Dr. Angelena Form was made aware of these results.  The  patient left lab in stable condition.  EKG:  EKG ordered today. EKG personally reviewed and demonstrates sinus bradycardia, rate 59 bpm, with non-specific T wave flattening in lead aVL but ST depression in inferior and lateral leads have resolved from tracing on 04/13/2019 .  Recent Labs: 04/13/2019: BUN 10; Creatinine, Ser 1.02; Hemoglobin 15.3; Platelets 252; Potassium 4.2; Sodium 138  Recent Lipid Panel    Component Value  Date/Time   CHOL 149 04/11/2019 0443   TRIG 37 04/11/2019 0443   HDL 73 04/11/2019 0443   CHOLHDL 2.0 04/11/2019 0443   VLDL 7 04/11/2019 0443   LDLCALC 69 04/11/2019 0443    Physical Exam:    Vital Signs: BP 122/84   Pulse 63   Temp (!) 97.3 F (36.3 C) (Temporal)   Ht 6\' 2"  (1.88 m)   Wt 181 lb (82.1 kg)   SpO2 98%   BMI 23.24 kg/m     Wt Readings from Last 3 Encounters:  04/26/19 181 lb (82.1 kg)  04/13/19 167 lb 12.8 oz (76.1 kg)  03/25/19 173 lb (78.5 kg)     General: 73 y.o. African-American male in no acute distress. HEENT: Normocephalic and atraumatic. Sclera clear. EOMs intact. Neck: Supple. No carotid bruits. No JVD. Heart: Borderline bradycardic with regular rhythm. Distinct S1 and S2. No murmurs, gallops, or rubs. Radial pulses 2+ and equal bilaterally. Radial cath site soft with no signs of hematoma.  Lungs: No increased work of breathing. Clear to ausculation bilaterally. No wheezes, rhonchi, or rales.  Abdomen: Soft, non-distended, and non-tender to palpation. Bowel sounds present in all 4 quadrants.  MSK: Normal strength and tone for age. Extremities: No lower extremity edema.    Skin: Warm and dry. Neuro: Alert and oriented x3. No focal deficits. Psych: Normal affect. Responds appropriately.   Assessment:    1. Coronary artery disease involving native coronary artery of native heart without angina pectoris   2. Essential hypertension   3. Prediabetes   4. Tobacco abuse     Plan:    CAD with Recent NSTEMI - Patient recently  admitted with NSTEMI. Cardiac catheterization showed 90% stenosis of LAD which was treated with DES.  - No recurrent chest pain. - Continue dual antiplatelet therapy with Aspirin 81mg  daily and Brilinta 90mg  twice daily for at least 1 year. He does report some transient shortness of breath with Brilinta but not too bothersome at this time. Patient OK with continuing Brilinta for now. - Continue aggressive secondary prevention. LDL 69 on 04/11/2019 on fish oil alone which is at goal of <70. However given NSTEMI, Lipitor 40mg  daily added during admission. Will need repeat lipid panel/LFTs in 6-8 weeks. No beta-blocker due to bradycardia. Will give patient new prescription of sublingual Nitro and reviewed how to use this.   Hypertension - BP well controlled at 122/84. - Continue Losartan 25mg  daily. Will recheck BMET today.  Prediabetes - Hemoglobin A1c 5.9 during recent admission. - Discussed importance of lifestyle modification including staying active and following heart healthy diet. - Follow-up with PCP.  Tobacco Abuse - Patient still smoking but working on quitting. Down from 10 to 4 cigarettes per day. - Congratulated patient on progress so far and emphasized the importance of complete cessation.   Disposition: Follow up in 3 month with Dr. Martinique.   Medication Adjustments/Labs and Tests Ordered: Current medicines are reviewed at length with the patient today.  Concerns regarding medicines are outlined above.  Orders Placed This Encounter  Procedures  . Basic Metabolic Panel (BMET)  . EKG 12-Lead   Meds ordered this encounter  Medications  . nitroGLYCERIN (NITROSTAT) 0.4 MG SL tablet    Sig: Place 1 tablet (0.4 mg total) under the tongue every 5 (five) minutes as needed for chest pain.    Dispense:  90 tablet    Refill:  3    Patient Instructions  Medication Instructions:  START Nitrostat Place one  tablet under your tongue for emergent chest pain, if pain persists call 911  wait five minutes, then take second dose; if pain continues wait five minutes then take final dose. Do not take more than 3 doses  *If you need a refill on your cardiac medications before your next appointment, please call your pharmacy*  Lab Work: Your physician recommends that you return for lab work in: TODAY-BMET If you have labs (blood work) drawn today and your tests are completely normal, you will receive your results only by: [name] MyChart Message (if you have MyChart) OR . A paper copy in the mail If you have any lab test that is abnormal or we need to change your treatment, we will call you to review the results.  Testing/Procedures: NONE   Follow-Up: At [facility], you and your health needs are our priority.  As part of our continuing mission to provide you with exceptional heart care, we have created designated Provider Care Teams.  These Care Teams include your primary Cardiologist (physician) and Advanced Practice Providers (APPs -  Physician Assistants and Nurse Practitioners) who all work together to provide you with the care you need, when you need it.  Your next appointment:   3-4 month(s)  The format for your next appointment:   In Person  Provider:   Peter Martinique, MD  Other Instructions     Signed, Darreld Mclean, PA-C  04/26/2019 8:49 AM    [place]

## 2019-04-26 ENCOUNTER — Other Ambulatory Visit: Payer: Self-pay

## 2019-04-26 ENCOUNTER — Encounter (INDEPENDENT_AMBULATORY_CARE_PROVIDER_SITE_OTHER): Payer: Self-pay

## 2019-04-26 ENCOUNTER — Ambulatory Visit (INDEPENDENT_AMBULATORY_CARE_PROVIDER_SITE_OTHER): Payer: Medicare Other | Admitting: Student

## 2019-04-26 ENCOUNTER — Encounter: Payer: Self-pay | Admitting: Physician Assistant

## 2019-04-26 VITALS — BP 122/84 | HR 63 | Temp 97.3°F | Ht 74.0 in | Wt 181.0 lb

## 2019-04-26 DIAGNOSIS — I10 Essential (primary) hypertension: Secondary | ICD-10-CM | POA: Diagnosis not present

## 2019-04-26 DIAGNOSIS — I2510 Atherosclerotic heart disease of native coronary artery without angina pectoris: Secondary | ICD-10-CM | POA: Diagnosis not present

## 2019-04-26 DIAGNOSIS — Z720 Tobacco use: Secondary | ICD-10-CM

## 2019-04-26 DIAGNOSIS — R7303 Prediabetes: Secondary | ICD-10-CM

## 2019-04-26 MED ORDER — NITROGLYCERIN 0.4 MG SL SUBL: 0.4000 mg | SUBLINGUAL_TABLET | SUBLINGUAL | 3 refills | Status: DC | PRN

## 2019-04-26 NOTE — Patient Instructions (Addendum)
Medication Instructions:  START Nitrostat Place one tablet under your tongue for emergent chest pain, if pain persists call 911 wait five minutes, then take second dose; if pain continues wait five minutes then take final dose. Do not take more than 3 doses  *If you need a refill on your cardiac medications before your next appointment, please call your pharmacy*  Lab Work: Your physician recommends that you return for lab work in: TODAY-BMET Your physician recommends that you return for lab work in: 2 months complete FASTING-LIPID, LFT If you have labs (blood work) drawn today and your tests are completely normal, you will receive your results only by: [name] MyChart Message (if you have MyChart) OR . A paper copy in the mail If you have any lab test that is abnormal or we need to change your treatment, we will call you to review the results.  Testing/Procedures: NONE   Follow-Up: At [facility], you and your health needs are our priority.  As part of our continuing mission to provide you with exceptional heart care, we have created designated Provider Care Teams.  These Care Teams include your primary Cardiologist (physician) and Advanced Practice Providers (APPs -  Physician Assistants and Nurse Practitioners) who all work together to provide you with the care you need, when you need it.  Your next appointment:   3-4 month(s)  The format for your next appointment:   In Person  Provider:   Peter Martinique, MD  Other Instructions

## 2019-04-27 ENCOUNTER — Telehealth: Payer: Self-pay

## 2019-04-27 ENCOUNTER — Encounter: Payer: Self-pay | Admitting: *Deleted

## 2019-04-27 LAB — BASIC METABOLIC PANEL
BUN/Creatinine Ratio: 12 (ref 10–24)
BUN: 12 mg/dL (ref 8–27)
CO2: 23 mmol/L (ref 20–29)
Calcium: 10 mg/dL (ref 8.6–10.2)
Chloride: 103 mmol/L (ref 96–106)
Creatinine, Ser: 1.01 mg/dL (ref 0.76–1.27)
GFR calc Af Amer: 85 mL/min/{1.73_m2} (ref >59)
GFR calc non Af Amer: 73 mL/min/{1.73_m2} (ref >59)
Glucose: 97 mg/dL (ref 65–99)
Potassium: 4.5 mmol/L (ref 3.5–5.2)
Sodium: 140 mmol/L (ref 134–144)

## 2019-04-27 NOTE — Telephone Encounter (Signed)
Called patient to let know that he will need to have fasting labs in 2 months (February 2021) around this time and that I would mail him a lab slip. He voiced understanding.

## 2019-04-27 NOTE — Addendum Note (Signed)
Addended by: Ulice Brilliant T on: 04/27/2019 04:30 PM   Modules accepted: Orders

## 2019-05-17 ENCOUNTER — Encounter: Payer: Self-pay | Admitting: Nurse Practitioner

## 2019-05-17 ENCOUNTER — Ambulatory Visit (INDEPENDENT_AMBULATORY_CARE_PROVIDER_SITE_OTHER): Payer: Medicare Other | Admitting: Nurse Practitioner

## 2019-05-17 ENCOUNTER — Other Ambulatory Visit: Payer: Self-pay

## 2019-05-17 VITALS — BP 148/78 | HR 79 | Temp 98.1°F | Resp 16 | Ht 74.0 in | Wt 179.0 lb

## 2019-05-17 DIAGNOSIS — I214 Non-ST elevation (NSTEMI) myocardial infarction: Secondary | ICD-10-CM

## 2019-05-17 DIAGNOSIS — I10 Essential (primary) hypertension: Secondary | ICD-10-CM

## 2019-05-17 DIAGNOSIS — R338 Other retention of urine: Secondary | ICD-10-CM

## 2019-05-17 DIAGNOSIS — Z23 Encounter for immunization: Secondary | ICD-10-CM | POA: Diagnosis not present

## 2019-05-17 DIAGNOSIS — F17200 Nicotine dependence, unspecified, uncomplicated: Secondary | ICD-10-CM | POA: Diagnosis not present

## 2019-05-17 DIAGNOSIS — N401 Enlarged prostate with lower urinary tract symptoms: Secondary | ICD-10-CM | POA: Diagnosis not present

## 2019-05-17 LAB — POCT URINALYSIS DIPSTICK
Bilirubin, UA: NEGATIVE
Glucose, UA: NEGATIVE
Ketones, UA: NEGATIVE
Leukocytes, UA: NEGATIVE
Nitrite, UA: NEGATIVE
Protein, UA: NEGATIVE
Spec Grav, UA: 1.015 (ref 1.010–1.025)
Urobilinogen, UA: 0.2 E.U./dL
pH, UA: 5.5 (ref 5.0–8.0)

## 2019-05-17 NOTE — Patient Instructions (Addendum)
Chronic Obstructive Pulmonary Disease Chronic obstructive pulmonary disease (COPD) is a long-term (chronic) lung problem. When you have COPD, it is hard for air to get in and out of your lungs. Usually the condition gets worse over time, and your lungs will never return to normal. There are things you can do to keep yourself as healthy as possible.  Your doctor may treat your condition with: ? Medicines. ? Oxygen. ? Lung surgery.  Your doctor may also recommend: ? Rehabilitation. This includes steps to make your body work better. It may involve a team of specialists. ? Quitting smoking, if you smoke. ? Exercise and changes to your diet. ? Comfort measures (palliative care). Follow these instructions at home: Medicines  Take over-the-counter and prescription medicines only as told by your doctor.  Talk to your doctor before taking any cough or allergy medicines. You may need to avoid medicines that cause your lungs to be dry. Lifestyle  If you smoke, stop. Smoking makes the problem worse. If you need help quitting, ask your doctor.  Avoid being around things that make your breathing worse. This may include smoke, chemicals, and fumes.  Stay active, but remember to rest as well.  Learn and use tips on how to relax.  Make sure you get enough sleep. Most adults need at least 7 hours of sleep every night.  Eat healthy foods. Eat smaller meals more often. Rest before meals. Controlled breathing Learn and use tips on how to control your breathing as told by your doctor. Try:  Breathing in (inhaling) through your nose for 1 second. Then, pucker your lips and breath out (exhale) through your lips for 2 seconds.  Putting one hand on your belly (abdomen). Breathe in slowly through your nose for 1 second. Your hand on your belly should move out. Pucker your lips and breathe out slowly through your lips. Your hand on your belly should move in as you breathe out.  Controlled coughing Learn  and use controlled coughing to clear mucus from your lungs. Follow these steps: 1. Lean your head a little forward. 2. Breathe in deeply. 3. Try to hold your breath for 3 seconds. 4. Keep your mouth slightly open while coughing 2 times. 5. Spit any mucus out into a tissue. 6. Rest and do the steps again 1 or 2 times as needed. General instructions  Make sure you get all the shots (vaccines) that your doctor recommends. Ask your doctor about a flu shot and a pneumonia shot.  Use oxygen therapy and pulmonary rehabilitation if told by your doctor. If you need home oxygen therapy, ask your doctor if you should buy a tool to measure your oxygen level (oximeter).  Make a COPD action plan with your doctor. This helps you to know what to do if you feel worse than usual.  Manage any other conditions you have as told by your doctor.  Avoid going outside when it is very hot, cold, or humid.  Avoid people who have a sickness you can catch (contagious).  Keep all follow-up visits as told by your doctor. This is important. Contact a doctor if:  You cough up more mucus than usual.  There is a change in the color or thickness of the mucus.  It is harder to breathe than usual.  Your breathing is faster than usual.  You have trouble sleeping.  You need to use your medicines more often than usual.  You have trouble doing your normal activities such as getting dressed   or walking around the house. Get help right away if:  You have shortness of breath while resting.  You have shortness of breath that stops you from: ? Being able to talk. ? Doing normal activities.  Your chest hurts for longer than 5 minutes.  Your skin color is more blue than usual.  Your pulse oximeter shows that you have low oxygen for longer than 5 minutes.  You have a fever.  You feel too tired to breathe normally. Summary  Chronic obstructive pulmonary disease (COPD) is a long-term lung problem.  The way your  lungs work will never return to normal. Usually the condition gets worse over time. There are things you can do to keep yourself as healthy as possible.  Take over-the-counter and prescription medicines only as told by your doctor.  If you smoke, stop. Smoking makes the problem worse. This information is not intended to replace advice given to you by your health care provider. Make sure you discuss any questions you have with your health care provider. Document Revised: 04/04/2017 Document Reviewed: 05/27/2016 Elsevier Patient Education  2020 [employer].  Steps to Quit Smoking Smoking tobacco is the leading cause of preventable death. It can affect almost every organ in the body. Smoking puts you and people around you at risk for many serious, long-lasting (chronic) diseases. Quitting smoking can be hard, but it is one of the best things that you can do for your health. It is never too late to quit. How do I get ready to quit? When you decide to quit smoking, make a plan to help you succeed. Before you quit:  Pick a date to quit. Set a date within the next 2 weeks to give you time to prepare.  Write down the reasons why you are quitting. Keep this list in places where you will see it often.  Tell your family, friends, and co-workers that you are quitting. Their support is important.  Talk with your doctor about the choices that may help you quit.  Find out if your health insurance will pay for these treatments.  Know the people, places, things, and activities that make you want to smoke (triggers). Avoid them. What first steps can I take to quit smoking?  Throw away all cigarettes at home, at work, and in your car.  Throw away the things that you use when you smoke, such as ashtrays and lighters.  Clean your car. Make sure to empty the ashtray.  Clean your home, including curtains and carpets. What can I do to help me quit smoking? Talk with your doctor about taking medicines  and seeing a counselor at the same time. You are more likely to succeed when you do both.  If you are pregnant or breastfeeding, talk with your doctor about counseling or other ways to quit smoking. Do not take medicine to help you quit smoking unless your doctor tells you to do so. To quit smoking: Quit right away  Quit smoking totally, instead of slowly cutting back on how much you smoke over a period of time.  Go to counseling. You are more likely to quit if you go to counseling sessions regularly. Take medicine You may take medicines to help you quit. Some medicines need a prescription, and some you can buy over-the-counter. Some medicines may contain a drug called nicotine to replace the nicotine in cigarettes. Medicines may:  Help you to stop having the desire to smoke (cravings).  Help to stop the problems that  come when you stop smoking (withdrawal symptoms). Your doctor may ask you to use:  Nicotine patches, gum, or lozenges.  Nicotine inhalers or sprays.  Non-nicotine medicine that is taken by mouth. Find resources Find resources and other ways to help you quit smoking and remain smoke-free after you quit. These resources are most helpful when you use them often. They include:  Online chats with a Social worker.  Phone quitlines.  Printed Furniture conservator/restorer.  Support groups or group counseling.  Text messaging programs.  Mobile phone apps. Use apps on your mobile phone or tablet that can help you stick to your quit plan. There are many free apps for mobile phones and tablets as well as websites. Examples include Quit Guide from the [employer] and smokefree.gov  What things can I do to make it easier to quit?   Talk to your family and friends. Ask them to support and encourage you.  Call a phone quitline (1-800-QUIT-NOW), reach out to support groups, or work with a Social worker.  Ask people who smoke to not smoke around you.  Avoid places that make you want to smoke, such  as: ? Bars. ? Parties. ? Smoke-break areas at work.  Spend time with people who do not smoke.  Lower the stress in your life. Stress can make you want to smoke. Try these things to help your stress: ? Getting regular exercise. ? Doing deep-breathing exercises. ? Doing yoga. ? Meditating. ? Doing a body scan. To do this, close your eyes, focus on one area of your body at a time from head to toe. Notice which parts of your body are tense. Try to relax the muscles in those areas. How will I feel when I quit smoking? Day 1 to 3 weeks Within the first 24 hours, you may start to have some problems that come from quitting tobacco. These problems are very bad 2-3 days after you quit, but they do not often last for more than 2-3 weeks. You may get these symptoms:  Mood swings.  Feeling restless, nervous, angry, or annoyed.  Trouble concentrating.  Dizziness.  Strong desire for high-sugar foods and nicotine.  Weight gain.  Trouble pooping (constipation).  Feeling like you may vomit (nausea).  Coughing or a sore throat.  Changes in how the medicines that you take for other issues work in your body.  Depression.  Trouble sleeping (insomnia). Week 3 and afterward After the first 2-3 weeks of quitting, you may start to notice more positive results, such as:  Better sense of smell and taste.  Less coughing and sore throat.  Slower heart rate.  Lower blood pressure.  Clearer skin.  Better breathing.  Fewer sick days. Quitting smoking can be hard. Do not give up if you fail the first time. Some people need to try a few times before they succeed. Do your best to stick to your quit plan, and talk with your doctor if you have any questions or concerns. Summary  Smoking tobacco is the leading cause of preventable death. Quitting smoking can be hard, but it is one of the best things that you can do for your health.  When you decide to quit smoking, make a plan to help you  succeed.  Quit smoking right away, not slowly over a period of time.  When you start quitting, seek help from your doctor, family, or friends. This information is not intended to replace advice given to you by your health care provider. Make sure you discuss any  questions you have with your health care provider. Document Revised: 01/15/2019 Document Reviewed: 07/11/2018 Elsevier Patient Education  [employer].

## 2019-05-17 NOTE — Progress Notes (Signed)
Established Patient Office Visit  Subjective:  Patient ID: Steven Lowery, male    DOB: 1945-07-06  Age: 74 y.o. MRN: [id number]  CC:  Chief Complaint  Patient presents with  . Hypertension  . Shortness of Breath    since last hospital visit using albuterol     HPI Steven Lowery presents for follow-up from recent hospital admission for non-STEMI.  He is currently on Brilinta, baby aspirin, losartan, Flomax, Lipitor and nitroglycerin.  He admits that he continues to have occasional shortness of breath.  He was started on an albuterol inhaler which he states was effective.  He denies any lightheadedness, dizziness neck or chest pain, tightness or pressure.  He denies any nausea or abdominal pain.  He has not started any cardiac rehab at this time.  He admits that he is having some right arm pain that comes and goes.  He feels like he may have something to do with the procedure.  He denies numbness or tingling in his arm. He does continue to smoke but admits that he is down to approximately 5 cigarettes/day.  He admits that he does have some anxiety but states that he is trying to make sure not to get excited.  He is using some breathing exercises.  Past Medical History:  Diagnosis Date  . Acute asthmatic bronchitis   . Alcohol abuse   . Back pain   . Benign prostatic hypertrophy   . Cigarette smoker   . Coronary artery disease   . DJD (degenerative joint disease)   . Hypertension   . IBS (irritable bowel syndrome)   . Myocardial infarction [facility])    per pt he thinks he had an MI "5 years ago"  . Neck pain   . Psychiatric disorder     Past Surgical History:  Procedure Laterality Date  . CORONARY STENT INTERVENTION N/A 04/12/2019   Procedure: CORONARY STENT INTERVENTION;  Surgeon: Lorretta Harp, MD;  Location: [facility];  Service: Cardiovascular;  Laterality: N/A;  . CYSTOSCOPY  1999   Dr. Janice Norrie  . HAND SURGERY    . LEFT HEART CATH AND CORONARY ANGIOGRAPHY N/A  04/12/2019   Procedure: LEFT HEART CATH AND CORONARY ANGIOGRAPHY;  Surgeon: Lorretta Harp, MD;  Location: [facility];  Service: Cardiovascular;  Laterality: N/A;  . TONSILLECTOMY      Family History  Problem Relation Age of Onset  . Breast cancer Sister   . Heart disease Sister   . Heart attack Father   . Heart disease Brother   . Colon cancer Neg Hx   . Esophageal cancer Neg Hx   . Rectal cancer Neg Hx   . Stomach cancer Neg Hx     Social History   Socioeconomic History  . Marital status: Married    Spouse name: Not on file  . Number of children: Not on file  . Years of education: Not on file  . Highest education level: Not on file  Occupational History  . Not on file  Tobacco Use  . Smoking status: Current Some Day Smoker    Packs/day: 0.30    Years: 40.00    Pack years: 12.00    Types: Cigarettes  . Smokeless tobacco: Never Used  . Tobacco comment: has cut back alot per pt  Substance and Sexual Activity  . Alcohol use: Yes    Comment: quit 3 months ago  . Drug use: No  . Sexual activity: Yes  Other  Topics Concern  . Not on file  Social History Narrative  . Not on file   Social Determinants of Health   Financial Resource Strain:   . Difficulty of Paying Living Expenses: Not on file  Food Insecurity:   . Worried About Charity fundraiser in the Last Year: Not on file  . Ran Out of Food in the Last Year: Not on file  Transportation Needs:   . Lack of Transportation (Medical): Not on file  . Lack of Transportation (Non-Medical): Not on file  Physical Activity:   . Days of Exercise per Week: Not on file  . Minutes of Exercise per Session: Not on file  Stress:   . Feeling of Stress : Not on file  Social Connections:   . Frequency of Communication with Friends and Family: Not on file  . Frequency of Social Gatherings with Friends and Family: Not on file  . Attends Religious Services: Not on file  . Active Member of Clubs or Organizations: Not on  file  . Attends Archivist Meetings: Not on file  . Marital Status: Not on file  Intimate Partner Violence:   . Fear of Current or Ex-Partner: Not on file  . Emotionally Abused: Not on file  . Physically Abused: Not on file  . Sexually Abused: Not on file    Outpatient Medications Prior to Visit  Medication Sig Dispense Refill  . albuterol (VENTOLIN HFA) 108 (90 Base) MCG/ACT inhaler Inhale 1-2 puffs into the lungs every 6 (six) hours as needed for wheezing or shortness of breath. 1 g 0  . aspirin EC 81 MG tablet Take 1 tablet (81 mg total) by mouth daily. 90 tablet 3  . atorvastatin (LIPITOR) 40 MG tablet Take 1 tablet (40 mg total) by mouth daily at 6 PM. 90 tablet 3  . docusate sodium (COLACE) 100 MG capsule Take 100 mg by mouth daily.    . ferrous sulfate 325 (65 FE) MG tablet Take 325 mg by mouth daily with breakfast.    . fish oil-omega-3 fatty acids 1000 MG capsule Take 1 g by mouth daily.     [name] FLUTICASONE PROPIONATE, NASAL, NA Place into the nose.    . losartan (COZAAR) 25 MG tablet Take 1 tablet (25 mg total) by mouth daily. 90 tablet 3  . Multiple Vitamin (MULTIVITAMIN WITH MINERALS) TABS tablet Take 1 tablet by mouth daily.    . nitroGLYCERIN (NITROSTAT) 0.4 MG SL tablet Place 1 tablet (0.4 mg total) under the tongue every 5 (five) minutes as needed for chest pain. 90 tablet 3  . omeprazole (PRILOSEC) 20 MG capsule Take 20 mg by mouth daily.    [name] OVER THE COUNTER MEDICATION Place 1 drop into both eyes daily as needed (Eye burning). Reported on 09/11/2015    . tamsulosin (FLOMAX) 0.4 MG CAPS capsule TAKE 1 CAPSULE BY MOUTH DAILY 90 capsule 3  . ticagrelor (BRILINTA) 90 MG TABS tablet Take 1 tablet (90 mg total) by mouth 2 (two) times daily. 180 tablet 3  . Ascorbic Acid (VITAMIN C PO) Take 1 tablet by mouth daily.    [name] gabapentin (NEURONTIN) 300 MG capsule Take 1 capsule (300 mg total) by mouth 3 (three) times daily. (Patient not taking: Reported on 05/17/2019) 180  capsule 5  . lidocaine (XYLOCAINE) 5 % ointment Apply 1 application topically 4 (four) times daily as needed. (Patient not taking: Reported on 05/17/2019) 35.44 g 0   Facility-Administered Medications Prior to Visit  Medication Dose Route Frequency Provider Last Rate Last Admin  . 0.9 %  sodium chloride infusion  500 mL Intravenous Continuous Ladene Artist, MD        Allergies  Allergen Reactions  . Lisinopril Cough    ROS Review of Systems  Constitutional: Negative.   HENT: Negative.   Eyes: Negative.   Respiratory: Positive for shortness of breath.   Cardiovascular: Negative.   Gastrointestinal: Negative.   Genitourinary: Negative.   Musculoskeletal: Negative.   Skin: Negative.   Neurological: Negative.   Hematological: Negative.   Psychiatric/Behavioral: Negative.       Objective:    Physical Exam  Constitutional: He is oriented to person, place, and time. He appears well-developed and well-nourished.  HENT:  Head: Normocephalic and atraumatic.  Cardiovascular: Normal rate and normal heart sounds.  Pulmonary/Chest: Effort normal and breath sounds normal.  Abdominal: Soft. Bowel sounds are normal.  Musculoskeletal:        General: Normal range of motion.     Cervical back: Normal range of motion.  Neurological: He is alert and oriented to person, place, and time.  Skin: Skin is warm and dry.  Psychiatric: He has a normal mood and affect. His behavior is normal. Judgment and thought content normal.    BP (!) 148/78 (BP Location: Left Arm, Patient Position: Sitting, Cuff Size: Large) Comment: manually  Pulse 79   Temp 98.1 F (36.7 C) (Oral)   Resp 16   Ht 6\' 2"  (1.88 m)   Wt 179 lb (81.2 kg)   SpO2 100%   BMI 22.98 kg/m  Wt Readings from Last 3 Encounters:  05/17/19 179 lb (81.2 kg)  04/26/19 181 lb (82.1 kg)  04/13/19 167 lb 12.8 oz (76.1 kg)     There are no preventive care reminders to display for this patient.  There are no preventive care  reminders to display for this patient.  Lab Results  Component Value Date   TSH 0.946 04/17/2015   Lab Results  Component Value Date   WBC 5.2 04/13/2019   HGB 15.3 04/13/2019   HCT 43.2 04/13/2019   MCV 91.7 04/13/2019   PLT 252 04/13/2019   Lab Results  Component Value Date   NA 140 04/26/2019   K 4.5 04/26/2019   CO2 23 04/26/2019   GLUCOSE 97 04/26/2019   BUN 12 04/26/2019   CREATININE 1.01 04/26/2019   BILITOT 0.3 02/02/2018   ALKPHOS 86 02/02/2018   AST 24 02/02/2018   ALT 20 02/02/2018   PROT 6.3 02/02/2018   ALBUMIN 4.1 02/02/2018   CALCIUM 10.0 04/26/2019   ANIONGAP 8 04/13/2019   GFR 84.97 10/27/2012   Lab Results  Component Value Date   CHOL 149 04/11/2019   Lab Results  Component Value Date   HDL 73 04/11/2019   Lab Results  Component Value Date   LDLCALC 69 04/11/2019   Lab Results  Component Value Date   TRIG 37 04/11/2019   Lab Results  Component Value Date   CHOLHDL 2.0 04/11/2019   Lab Results  Component Value Date   HGBA1C 5.9 (H) 04/11/2019      Assessment & Plan:   Problem List Items Addressed This Visit      High   BPH (benign prostatic hyperplasia)   Essential hypertension - Primary    Continue with current regimen monitoring blood pressure at home.      Relevant Orders   Urinalysis Dipstick (Completed)   NSTEMI (non-ST elevated myocardial infarction) (  [facility])    Encourage patient to continue with current medication regimen as prescribed by cardiology.  Instructed patient on the importance of cardiac rehab along with smoking cessation        Medium   Tobacco dependence    Encourage patient to continue to work on smoking cessation and to call if he would like to start NicoDerm patch.         No orders of the defined types were placed in this encounter.   Follow-up: Return in about 3 months (around 08/15/2019) for MedMgmt.    Vevelyn Francois, NP

## 2019-05-19 ENCOUNTER — Telehealth (HOSPITAL_COMMUNITY): Payer: Self-pay | Admitting: Pharmacist

## 2019-05-19 NOTE — Assessment & Plan Note (Signed)
Encourage patient to continue to work on smoking cessation and to call if he would like to start NicoDerm patch.

## 2019-05-19 NOTE — Assessment & Plan Note (Signed)
Encourage patient to continue with current medication regimen as prescribed by cardiology.  Instructed patient on the importance of cardiac rehab along with smoking cessation

## 2019-05-19 NOTE — Assessment & Plan Note (Signed)
Continue with current regimen monitoring blood pressure at home.

## 2019-05-24 NOTE — Telephone Encounter (Signed)
Cardiac Rehab Medication Review by a Pharmacist  Does the patient feel that his/her medications are working for him/her?  yes  Has the patient been experiencing any side effects to the medications prescribed?  no  Does the patient measure his/her own blood pressure or blood glucose at home?  yes  -Patient cannot recall readings at the moment  Does the patient have any problems obtaining medications due to transportation or finances?   no  Understanding of regimen: good Understanding of indications: good Potential of compliance: good  Pharmacist comments: Patient reports using nitroglycerin SL tablet about a week ago and the chest pain resolved in 10-15 minutes. Patient did not contact doctor.   Lorel Monaco, PharmD PGY1 Ambulatory Care Resident [employer] # [phone]

## 2019-05-26 ENCOUNTER — Other Ambulatory Visit: Payer: Self-pay

## 2019-05-26 ENCOUNTER — Encounter (HOSPITAL_COMMUNITY)
Admission: RE | Admit: 2019-05-26 | Discharge: 2019-05-26 | Disposition: A | Discharge status: Home / Self Care | Payer: Self-pay | Source: Ambulatory Visit | Attending: Cardiology | Admitting: Cardiology

## 2019-05-26 DIAGNOSIS — Z955 Presence of coronary angioplasty implant and graft: Secondary | ICD-10-CM | POA: Insufficient documentation

## 2019-05-26 DIAGNOSIS — I214 Non-ST elevation (NSTEMI) myocardial infarction: Secondary | ICD-10-CM | POA: Insufficient documentation

## 2019-05-26 NOTE — Progress Notes (Signed)
Cardiac Rehab Note:  Successful telephone encounter to Steven Lowery to confirm Cardiac Rehab orientation appointment for 05/27/19 at 0900. Nursing assessment completed. Instructions for appointment provided. Patient screening for Covid-19 negative. Patient offered [facility] Cardiac Rehab App but unfortunately he does not have a smart device. Patient does agree to weekly/bi-weekly telephone follow ups.  Selita Staiger E. Laray Anger, BSN

## 2019-05-27 ENCOUNTER — Encounter (HOSPITAL_COMMUNITY)
Admission: RE | Admit: 2019-05-27 | Discharge: 2019-05-27 | Disposition: A | Discharge status: Home / Self Care | Payer: Self-pay | Source: Ambulatory Visit | Attending: Cardiology | Admitting: Cardiology

## 2019-05-27 ENCOUNTER — Encounter (HOSPITAL_COMMUNITY): Payer: Self-pay

## 2019-05-27 VITALS — BP 142/94 | Temp 97.5°F | Ht 73.0 in | Wt 181.2 lb

## 2019-05-27 DIAGNOSIS — I214 Non-ST elevation (NSTEMI) myocardial infarction: Secondary | ICD-10-CM

## 2019-05-27 DIAGNOSIS — Z955 Presence of coronary angioplasty implant and graft: Secondary | ICD-10-CM
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
## 2019-05-27 NOTE — Progress Notes (Signed)
Reviewed home exercise guidelines with patient including endpoints, temperature precautions, target heart rate and rate of perceived exertion. Pt plans to walk 30 minutes, 4 days/week as his mode of home exercise. Patient's goal is to walk 30 minutes daily. Pt voices understanding of instructions given.  Sol Passer, MS, ACSM CEP

## 2019-06-01 ENCOUNTER — Other Ambulatory Visit: Payer: Self-pay

## 2019-06-01 ENCOUNTER — Encounter (HOSPITAL_COMMUNITY)
Admission: RE | Admit: 2019-06-01 | Discharge: 2019-06-01 | Disposition: A | Discharge status: Home / Self Care | Payer: Self-pay | Source: Ambulatory Visit | Attending: Cardiology | Admitting: Cardiology

## 2019-06-01 ENCOUNTER — Telehealth (HOSPITAL_COMMUNITY): Payer: Self-pay | Admitting: *Deleted

## 2019-06-01 ENCOUNTER — Encounter (HOSPITAL_COMMUNITY): Payer: Medicare Other

## 2019-06-01 NOTE — Telephone Encounter (Signed)
Spoke with Jametrius he is walking at home he walked for 30 minutes yesterday. Jaquelle says that he is interested in participating in in person rehab when the program reopens.Barnet Pall, RN,BSN 06/01/2019 10:17 AM

## 2019-06-02 ENCOUNTER — Inpatient Hospital Stay (HOSPITAL_COMMUNITY)
Admission: RE | Admit: 2019-06-02 | Discharge: 2019-06-02 | Disposition: A | Discharge status: Home / Self Care | Payer: Medicare Other | Source: Ambulatory Visit

## 2019-06-02 ENCOUNTER — Other Ambulatory Visit: Payer: Self-pay

## 2019-06-02 ENCOUNTER — Telehealth (HOSPITAL_COMMUNITY): Payer: Self-pay | Admitting: *Deleted

## 2019-06-02 NOTE — Progress Notes (Signed)
Steven Lowery 74 y.o. male Nutrition Note - Virtual cardiac rehab note  Visit Diagnosis: NSTEMI (non-ST elevated myocardial infarction) (Steven Lowery)  S/P coronary artery stent placement 04/11/20  Past Medical History:  Diagnosis Date  . Acute asthmatic bronchitis   . Alcohol abuse   . Back pain   . Benign prostatic hypertrophy   . Cigarette smoker   . Coronary artery disease   . DJD (degenerative joint disease)   . Hypertension   . IBS (irritable bowel syndrome)   . Myocardial infarction [facility])    per pt he thinks he had an MI "5 years ago"  . Neck pain   . Psychiatric disorder      Medications reviewed.   Current Outpatient Medications:  .  albuterol (VENTOLIN HFA) 108 (90 Base) MCG/ACT inhaler, Inhale 1-2 puffs into the lungs every 6 (six) hours as needed for wheezing or shortness of breath., Disp: 1 g, Rfl: 0 .  Ascorbic Acid (VITAMIN C PO), Take 1 tablet by mouth daily., Disp: , Rfl:  .  aspirin EC 81 MG tablet, Take 1 tablet (81 mg total) by mouth daily., Disp: 90 tablet, Rfl: 3 .  atorvastatin (LIPITOR) 40 MG tablet, Take 1 tablet (40 mg total) by mouth daily at 6 PM., Disp: 90 tablet, Rfl: 3 .  docusate sodium (COLACE) 100 MG capsule, Take 100 mg by mouth daily., Disp: , Rfl:  .  ferrous sulfate 325 (65 FE) MG tablet, Take 325 mg by mouth daily with breakfast., Disp: , Rfl:  .  fish oil-omega-3 fatty acids 1000 MG capsule, Take 1 g by mouth daily. , Disp: , Rfl:  .  FLUTICASONE PROPIONATE, NASAL, NA, Place into the nose., Disp: , Rfl:  .  gabapentin (NEURONTIN) 300 MG capsule, Take 1 capsule (300 mg total) by mouth 3 (three) times daily. (Patient not taking: Reported on 05/17/2019), Disp: 180 capsule, Rfl: 5 .  lidocaine (XYLOCAINE) 5 % ointment, Apply 1 application topically 4 (four) times daily as needed., Disp: 35.44 g, Rfl: 0 .  losartan (COZAAR) 25 MG tablet, Take 1 tablet (25 mg total) by mouth daily., Disp: 90 tablet, Rfl: 3 .  Multiple Vitamin (MULTIVITAMIN WITH  MINERALS) TABS tablet, Take 1 tablet by mouth daily., Disp: , Rfl:  .  nitroGLYCERIN (NITROSTAT) 0.4 MG SL tablet, Place 1 tablet (0.4 mg total) under the tongue every 5 (five) minutes as needed for chest pain., Disp: 90 tablet, Rfl: 3 .  omeprazole (PRILOSEC) 20 MG capsule, Take 20 mg by mouth daily., Disp: , Rfl:  .  OVER THE COUNTER MEDICATION, Place 1 drop into both eyes daily as needed (Eye burning). Reported on 09/11/2015, Disp: , Rfl:  .  tamsulosin (FLOMAX) 0.4 MG CAPS capsule, TAKE 1 CAPSULE BY MOUTH DAILY, Disp: 90 capsule, Rfl: 3 .  ticagrelor (BRILINTA) 90 MG TABS tablet, Take 1 tablet (90 mg total) by mouth 2 (two) times daily., Disp: 180 tablet, Rfl: 3  Current Facility-Administered Medications:  .  0.9 %  sodium chloride infusion, 500 mL, Intravenous, Continuous, Ladene Artist, MD   Ht Readings from Last 1 Encounters:  05/27/19 6\' 1"  (1.854 m)     Wt Readings from Last 3 Encounters:  05/27/19 181 lb 3.5 oz (82.2 kg)  05/17/19 179 lb (81.2 kg)  04/26/19 181 lb (82.1 kg)     There is no height or weight on file to calculate BMI.   Social History   Tobacco Use  Smoking Status Former Smoker  .  Packs/day: 0.30  . Years: 40.00  . Pack years: 12.00  . Types: Cigarettes  . Quit date: 05/24/2019  . Years since quitting: 0.0  Smokeless Tobacco Never Used  Tobacco Comment   has cut back alot per pt     Lab Results  Component Value Date   CHOL 149 04/11/2019   Lab Results  Component Value Date   HDL 73 04/11/2019   Lab Results  Component Value Date   LDLCALC 69 04/11/2019   Lab Results  Component Value Date   TRIG 37 04/11/2019   Lab Results  Component Value Date   CHOLHDL 2.0 04/11/2019     Lab Results  Component Value Date   HGBA1C 5.9 (H) 04/11/2019     CBG (last 3)  No results for input(s): GLUCAP in the last 72 hours.   Nutrition Note  Spoke with pt. Nutrition Plan and Nutrition Survey goals reviewed with pt. Pt lives with sister who  prepares food for him. She reports trying to follow a heart healthy diet.  Pt has Pre-diabetes. Last A1c indicates blood glucose well-controlled.  We discussed limiting carbs to 1/4 plate and choosing protein and unsaturated fats for snacks (nuts, peanut butter)  Theon's sister is reading labels. Education provided and handouts provided.   Pt expressed understanding of the information reviewed.    Nutrition Diagnosis ? Food-and nutrition-related knowledge deficit related to lack of exposure to information as related to diagnosis of: ? CVD ? Pre-diabetes  Nutrition Intervention ? Pt's individual nutrition plan reviewed with pt. ? Benefits of adopting Heart Healthy diet discussed when Medficts reviewed.   ? Continue client-centered nutrition education by RD, as part of interdisciplinary care.  Goal(s) ? Pt to build a healthy plate including vegetables, fruits, whole grains, and low-fat dairy products in a heart healthy meal plan. ? Pt to be able to name foods that affect blood glucose   Plan:  Will provide client-centered nutrition education as part of interdisciplinary care  Monitor and evaluate progress toward nutrition goal with team.   Michaele Offer, MS, RDN, LDN

## 2019-06-02 NOTE — Telephone Encounter (Signed)
Spoke with Mr Shangraw as he mentioned that he sometimes has shortness of breath spontaneously at times during the day when talking to the dietitian earlier today.. Patient reviewed his medications over the phone  with his bottles on hand and is taking his medications as prescribed except his Neurontin and Prilosec. Mr Rudell denies having chest pain and says he tries to walk 30 minutes a day. Patient has a BP cuff and checks his blood pressure from time to time. Will notify Dr Doug Sou office about the patients complaints. Advised Mr Morles to contact Dr Doug Sou office if he has an increase in his symptoms. Patient states understanding.Barnet Pall, RN,BSN 06/02/2019 11:54 AM

## 2019-06-07 ENCOUNTER — Other Ambulatory Visit: Payer: Self-pay | Admitting: Nurse Practitioner

## 2019-06-07 ENCOUNTER — Telehealth: Payer: Self-pay | Admitting: Family Medicine

## 2019-06-07 NOTE — Telephone Encounter (Signed)
Patient is requesting a rx for Viagra. He does not have this on his current list. Please advise if this can be sent in for him. Thanks!

## 2019-06-07 NOTE — Telephone Encounter (Signed)
Patient does states he wants this medicine and is aware not to take with Nitro.

## 2019-06-08 MED ORDER — SILDENAFIL CITRATE 25 MG PO TABS: 25.0000 mg | ORAL_TABLET | Freq: Every day | ORAL | 1 refills | Status: DC | PRN

## 2019-06-08 NOTE — Telephone Encounter (Signed)
This has been refilled to [facility].

## 2019-06-09 ENCOUNTER — Encounter (HOSPITAL_COMMUNITY)
Admission: RE | Admit: 2019-06-09 | Discharge: 2019-06-09 | Disposition: A | Discharge status: Home / Self Care | Payer: Medicare Other | Source: Ambulatory Visit | Attending: Cardiology | Admitting: Cardiology

## 2019-06-09 ENCOUNTER — Other Ambulatory Visit: Payer: Self-pay

## 2019-06-09 DIAGNOSIS — Z955 Presence of coronary angioplasty implant and graft: Secondary | ICD-10-CM | POA: Insufficient documentation

## 2019-06-09 DIAGNOSIS — I214 Non-ST elevation (NSTEMI) myocardial infarction: Secondary | ICD-10-CM | POA: Insufficient documentation

## 2019-06-09 NOTE — Progress Notes (Signed)
Virtual Visit- Nutrition Note  Spoke with Steve and his sister over the phone. He reports walking more often for exercise. He also says he cut back on potato chips and is choosing nuts and peanut butter more often. He does not have any questions at this time. Provided contact information if he or his sister have any nutrition questions.  Michaele Offer, MS, RDN, LDN

## 2019-06-11 ENCOUNTER — Telehealth: Payer: Self-pay

## 2019-06-11 NOTE — Telephone Encounter (Signed)
Called patient left message on personal voice mail to call me back.I received a message from [facility] at cardiac rehab you have been having sob.I was calling to discuss.

## 2019-06-14 ENCOUNTER — Telehealth (HOSPITAL_COMMUNITY): Payer: Self-pay

## 2019-06-14 NOTE — Addendum Note (Signed)
Encounter addended by: Sol Passer on: 06/14/2019 4:10 PM  Actions taken: Flowsheet data copied forward, Flowsheet accepted

## 2019-06-14 NOTE — Telephone Encounter (Signed)
Pt insurance is active and benefits verified through [facility] Medicare Co-pay 0, DED 0/0 met, out of pocket $7,550/0 met, co-insurance 20%. no pre-authorization required. Passport, 06/14/2019'@4' :17pm, REF# [id number]  Will contact patient to see if he is interested in the Cardiac Rehab Program. If interested, patient will need to complete follow up appt. Once completed, patient will be contacted for scheduling upon review by the RN Navigator.  2ndary insurance is active and benefits verified through [place]. Co-pay 0, DED 0/0 met, out of pocket 0/0 met, co-insurance 0. No pre-authorization required. Passport, 06/14/2019'@4' :21pm, REF# [id number]

## 2019-06-15 ENCOUNTER — Encounter (HOSPITAL_COMMUNITY): Payer: Self-pay | Admitting: *Deleted

## 2019-06-15 DIAGNOSIS — Z955 Presence of coronary angioplasty implant and graft: Secondary | ICD-10-CM

## 2019-06-15 DIAGNOSIS — I214 Non-ST elevation (NSTEMI) myocardial infarction: Secondary | ICD-10-CM
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
## 2019-06-16 ENCOUNTER — Encounter (HOSPITAL_COMMUNITY): Payer: Self-pay

## 2019-06-16 NOTE — Progress Notes (Signed)
Phone call to Pt to inquire about interest for in person CR program. Pt was interested in enrolling in program and was scheduled to start on 06/23/2019. Pt understands COVID 19 protocols.

## 2019-06-22 ENCOUNTER — Emergency Department (HOSPITAL_COMMUNITY)
Admission: EM | Admit: 2019-06-22 | Discharge: 2019-06-22 | Disposition: A | Discharge status: Home / Self Care | Payer: Medicare Other | Attending: Emergency Medicine | Admitting: Emergency Medicine

## 2019-06-22 ENCOUNTER — Emergency Department (HOSPITAL_COMMUNITY): Payer: Medicare Other

## 2019-06-22 ENCOUNTER — Encounter (HOSPITAL_COMMUNITY): Payer: Self-pay

## 2019-06-22 ENCOUNTER — Other Ambulatory Visit: Payer: Self-pay

## 2019-06-22 DIAGNOSIS — Z79899 Other long term (current) drug therapy: Secondary | ICD-10-CM | POA: Diagnosis not present

## 2019-06-22 DIAGNOSIS — R0789 Other chest pain: Secondary | ICD-10-CM | POA: Diagnosis not present

## 2019-06-22 DIAGNOSIS — M25511 Pain in right shoulder: Secondary | ICD-10-CM | POA: Diagnosis not present

## 2019-06-22 DIAGNOSIS — Z720 Tobacco use: Secondary | ICD-10-CM | POA: Insufficient documentation

## 2019-06-22 DIAGNOSIS — I2510 Atherosclerotic heart disease of native coronary artery without angina pectoris: Secondary | ICD-10-CM | POA: Insufficient documentation

## 2019-06-22 DIAGNOSIS — Z7982 Long term (current) use of aspirin: Secondary | ICD-10-CM | POA: Insufficient documentation

## 2019-06-22 DIAGNOSIS — R0602 Shortness of breath: Secondary | ICD-10-CM | POA: Diagnosis not present

## 2019-06-22 LAB — BASIC METABOLIC PANEL
Anion gap: 9 (ref 5–15)
BUN: 10 mg/dL (ref 8–23)
CO2: 24 mmol/L (ref 22–32)
Calcium: 9.2 mg/dL (ref 8.9–10.3)
Chloride: 106 mmol/L (ref 98–111)
Creatinine, Ser: 1.15 mg/dL (ref 0.61–1.24)
GFR calc Af Amer: >60 mL/min (ref >60)
GFR calc non Af Amer: >60 mL/min (ref >60)
Glucose, Bld: 98 mg/dL (ref 70–99)
Potassium: 4.1 mmol/L (ref 3.5–5.1)
Sodium: 139 mmol/L (ref 135–145)

## 2019-06-22 LAB — CBC
HCT: 41.9 % (ref 39.0–52.0)
Hemoglobin: 14.1 g/dL (ref 13.0–17.0)
MCH: 31.1 pg (ref 26.0–34.0)
MCHC: 33.7 g/dL (ref 30.0–36.0)
MCV: 92.5 fL (ref 80.0–100.0)
Platelets: 258 10*3/uL (ref 150–400)
RBC: 4.53 MIL/uL (ref 4.22–5.81)
RDW: 13.2 % (ref 11.5–15.5)
WBC: 4.8 10*3/uL (ref 4.0–10.5)
nRBC: 0 % (ref 0.0–0.2)

## 2019-06-22 LAB — TROPONIN I (HIGH SENSITIVITY)
Troponin I (High Sensitivity): 4 ng/L (ref <18)
Troponin I (High Sensitivity): 3 ng/L (ref <18)

## 2019-06-22 MED ORDER — HYDROCODONE-ACETAMINOPHEN 5-325 MG PO TABS: 2.0000 | ORAL_TABLET | ORAL | 0 refills | Status: DC | PRN

## 2019-06-22 MED ORDER — HYDROCODONE-ACETAMINOPHEN 5-325 MG PO TABS
2.0000 | ORAL_TABLET | Freq: Once | ORAL | Status: AC
  Administered 2019-06-22: 13:00:00 2 via ORAL
  Filled 2019-06-22: qty 2

## 2019-06-22 MED ORDER — METHOCARBAMOL 500 MG PO TABS: 500.0000 mg | ORAL_TABLET | Freq: Two times a day (BID) | ORAL | 0 refills | Status: DC

## 2019-06-22 NOTE — ED Triage Notes (Signed)
Pt reports right sided sharp chest pain that started 2 days ago with some SOB, hx of stent placed in December. Pt a.o, resp e.u at this time.

## 2019-06-22 NOTE — ED Notes (Signed)
Patient verbalizes understanding of discharge instructions. Opportunity for questioning and answers were provided. Armband removed by staff, pt discharged from ED.  

## 2019-06-22 NOTE — ED Notes (Signed)
-  Patient reports right-side chest pain with movements that started on Sunday. Patient demonstrates by moving his right arm and palpating right chest, stating "its right here" -PT denies feeling SHOB this moment but states he felt short of breathe when the pain initially started on Sunday -Denies radiation to other parts; reports that he has taken all prescribed meds since stent placement as prescribed.

## 2019-06-22 NOTE — Discharge Instructions (Signed)
Return if any problems.

## 2019-06-23 ENCOUNTER — Encounter (HOSPITAL_COMMUNITY)
Admission: RE | Admit: 2019-06-23 | Discharge: 2019-06-23 | Disposition: A | Discharge status: Home / Self Care | Payer: Medicare Other | Source: Ambulatory Visit | Attending: Cardiology | Admitting: Cardiology

## 2019-06-23 LAB — GLUCOSE, CAPILLARY: Glucose-Capillary: 136 mg/dL — ABNORMAL HIGH (ref 70–99)

## 2019-06-23 NOTE — ED Provider Notes (Signed)
[facility] EMERGENCY DEPARTMENT Provider Note   CSN: [id number] Arrival date & time: 06/22/19  1032     History Chief Complaint  Patient presents with  . Chest Pain  . Shortness of Breath    Steven Lowery is a 74 y.o. male.  The history is provided by the patient. No language interpreter was used.  Chest Pain Pain location:  R chest Pain quality: aching   Pain radiates to:  Does not radiate Pain severity:  Moderate Onset quality:  Gradual Timing:  Constant Progression:  Worsening Chronicity:  New Context: raising an arm   Relieved by:  Nothing Worsened by:  Nothing Ineffective treatments:  None tried Associated symptoms: shortness of breath   Risk factors: coronary artery disease and hypertension   Shortness of Breath Associated symptoms: chest pain   Pt complains of soreness in his right chest, right neck and right shoulder.  Pt thinks he pulled a muscle in his chest.      Past Medical History:  Diagnosis Date  . Acute asthmatic bronchitis   . Alcohol abuse   . Back pain   . Benign prostatic hypertrophy   . Cigarette smoker   . Coronary artery disease   . DJD (degenerative joint disease)   . Hypertension   . IBS (irritable bowel syndrome)   . Myocardial infarction [facility])    per pt he thinks he had an MI "5 years ago"  . Neck pain   . Psychiatric disorder     Patient Active Problem List   Diagnosis Date Noted  . NSTEMI (non-ST elevated myocardial infarction) ([place])   . Precordial chest pain 04/09/2019  . Chest pain, precordial   . BPH (benign prostatic hyperplasia) 02/08/2015  . Decreased urine stream 02/08/2015  . 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 Tobacco abuse 06/28/2008  . Essential hypertension 06/28/2008  . Coronary atherosclerosis 06/28/2008  . ASTHMATIC BRONCHITIS, ACUTE 06/28/2008  . Osteoarthritis 06/28/2008  . NECK PAIN 06/28/2008  . Tobacco dependence 06/28/2008  .  Backache 12/24/2007  . ALCOHOL ABUSE 05/12/2007  . IRRITABLE BOWEL SYNDROME, HX OF 05/12/2007    Past Surgical History:  Procedure Laterality Date  . CARDIAC CATHETERIZATION    . CORONARY STENT INTERVENTION N/A 04/12/2019   Procedure: CORONARY STENT INTERVENTION;  Surgeon: Lorretta Harp, MD;  Location: [facility];  Service: Cardiovascular;  Laterality: N/A;  . CYSTOSCOPY  1999   Dr. Janice Norrie  . HAND SURGERY    . LEFT HEART CATH AND CORONARY ANGIOGRAPHY N/A 04/12/2019   Procedure: LEFT HEART CATH AND CORONARY ANGIOGRAPHY;  Surgeon: Lorretta Harp, MD;  Location: [facility];  Service: Cardiovascular;  Laterality: N/A;  . TONSILLECTOMY         Family History  Problem Relation Age of Onset  . Breast cancer Sister   . Heart disease Sister   . Heart attack Father   . Heart disease Brother   . Colon cancer Neg Hx   . Esophageal cancer Neg Hx   . Rectal cancer Neg Hx   . Stomach cancer Neg Hx     Social History   Tobacco Use  . Smoking status: Former Smoker    Packs/day: 0.30    Years: 40.00    Pack years: 12.00    Types: Cigarettes    Quit date: 05/24/2019    Years since quitting: 0.0  . Smokeless tobacco: Never Used  . Tobacco comment: has  cut back alot per pt  Substance Use Topics  . Alcohol use: Yes    Alcohol/week: 6.0 standard drinks    Types: 6 Shots of liquor per week    Comment: quit 3 months ago  . Drug use: No    Home Medications Prior to Admission medications   Medication Sig Start Date End Date Taking? Authorizing Provider  albuterol (VENTOLIN HFA) 108 (90 Base) MCG/ACT inhaler Inhale 1-2 puffs into the lungs every 6 (six) hours as needed for wheezing or shortness of breath. 03/25/19   Fransico Meadow, PA-C  Ascorbic Acid (VITAMIN C PO) Take 1 tablet by mouth daily.    [provider]  aspirin EC 81 MG tablet Take 1 tablet (81 mg total) by mouth daily. 08/03/15   Dorena Dew, FNP  atorvastatin (LIPITOR) 40 MG tablet Take 1  tablet (40 mg total) by mouth daily at 6 PM. 04/13/19   Duke, Tami Lin, PA  docusate sodium (COLACE) 100 MG capsule Take 100 mg by mouth daily.    [provider]  ferrous sulfate 325 (65 FE) MG tablet Take 325 mg by mouth daily with breakfast.    [provider]  fish oil-omega-3 fatty acids 1000 MG capsule Take 1 g by mouth daily.     [provider]  FLUTICASONE PROPIONATE, NASAL, NA Place into the nose.    [provider]  gabapentin (NEURONTIN) 300 MG capsule Take 1 capsule (300 mg total) by mouth 3 (three) times daily. Patient not taking: Reported on 05/17/2019 02/05/18   Lanae Boast, FNP  HYDROcodone-acetaminophen (NORCO/VICODIN) 5-325 MG tablet Take 2 tablets by mouth every 4 (four) hours as needed. 06/22/19   Fransico Meadow, PA-C  lidocaine (XYLOCAINE) 5 % ointment Apply 1 application topically 4 (four) times daily as needed. 11/02/18   Long, Wonda Olds, MD  losartan (COZAAR) 25 MG tablet Take 1 tablet (25 mg total) by mouth daily. 04/13/19   Duke, Tami Lin, PA  methocarbamol (ROBAXIN) 500 MG tablet Take 1 tablet (500 mg total) by mouth 2 (two) times daily. 06/22/19   Fransico Meadow, PA-C  Multiple Vitamin (MULTIVITAMIN WITH MINERALS) TABS tablet Take 1 tablet by mouth daily.    [provider]  nitroGLYCERIN (NITROSTAT) 0.4 MG SL tablet Place 1 tablet (0.4 mg total) under the tongue every 5 (five) minutes as needed for chest pain. 04/26/19 07/25/19  Darreld Mclean, PA-C  omeprazole (PRILOSEC) 20 MG capsule Take 20 mg by mouth daily.    [provider]  OVER THE COUNTER MEDICATION Place 1 drop into both eyes daily as needed (Eye burning). Reported on 09/11/2015    [provider]  sildenafil (VIAGRA) 25 MG tablet Take 1 tablet (25 mg total) by mouth daily as needed for erectile dysfunction. 06/08/19   Vevelyn Francois, NP  tamsulosin (FLOMAX) 0.4 MG CAPS capsule TAKE 1 CAPSULE BY MOUTH DAILY 04/19/19   Tresa Garter,  MD  ticagrelor (BRILINTA) 90 MG TABS tablet Take 1 tablet (90 mg total) by mouth 2 (two) times daily. 04/13/19   Ledora Bottcher, PA    Allergies    Lisinopril  Review of Systems   Review of Systems  Respiratory: Positive for shortness of breath.   Cardiovascular: Positive for chest pain.  All other systems reviewed and are negative.   Physical Exam Updated Vital Signs BP 135/76   Pulse 62   Temp 98 F (36.7 C) (Oral)   Resp 11  Ht 6\' 1"  (1.854 m)   Wt 82 kg   SpO2 100%   BMI 23.85 kg/m   Physical Exam Vitals and nursing note reviewed.  Constitutional:      Appearance: He is well-developed.  HENT:     Head: Normocephalic and atraumatic.  Eyes:     Conjunctiva/sclera: Conjunctivae normal.  Cardiovascular:     Rate and Rhythm: Normal rate and regular rhythm.     Heart sounds: Normal heart sounds. No murmur.  Pulmonary:     Effort: Pulmonary effort is normal. No respiratory distress.     Breath sounds: Normal breath sounds.  Abdominal:     Palpations: Abdomen is soft.     Tenderness: There is no abdominal tenderness.  Musculoskeletal:     Cervical back: Normal range of motion and neck supple.     Comments: Pain with movement of right shoulder, tender trapezius, tender anterior chest to palpation   Skin:    General: Skin is warm and dry.  Neurological:     General: No focal deficit present.     Mental Status: He is alert.     ED Results / Procedures / Treatments   Labs (all labs ordered are listed, but only abnormal results are displayed) Labs Reviewed  BASIC METABOLIC PANEL  CBC  TROPONIN I (HIGH SENSITIVITY)  TROPONIN I (HIGH SENSITIVITY)    EKG EKG Interpretation  Date/Time:  Tuesday June 22 2019 10:34:24 EST Ventricular Rate:  76 PR Interval:  148 QRS Duration: 90 QT Interval:  376 QTC Calculation: 423 R Axis:   74 Text Interpretation: Normal sinus rhythm Minimal voltage criteria for LVH, may be normal variant ( Sokolow-Lyon )  Borderline ECG No significant change since last tracing Confirmed by Wandra Arthurs [phone]) on 06/22/2019 11:00:14 AM   Radiology DG Chest 2 View  Result Date: 06/22/2019 CLINICAL DATA:  Chest pain and shortness of breath EXAM: CHEST - 2 VIEW COMPARISON:  April 09, 2019 FINDINGS: Lungs are mildly hyperexpanded. No edema or consolidation. Heart size and pulmonary vascularity are within normal limits. No adenopathy. No bone lesions. IMPRESSION: Lungs mildly hyperexpanded. No edema or airspace opacity. Cardiac silhouette within normal limits. No adenopathy. Electronically Signed   By: Lowella Grip III M.D.   On: 06/22/2019 10:56    Procedures Procedures (including critical care time)  Medications Ordered in ED Medications  HYDROcodone-acetaminophen (NORCO/VICODIN) 5-325 MG per tablet 2 tablet (2 tablets Oral Given 06/22/19 1230)    ED Course  I have reviewed the triage vital signs and the nursing notes.  Pertinent labs & imaging results that were available during my care of the patient were reviewed by me and considered in my medical decision making (see chart for details).    MDM Rules/Calculators/A&P                      MDM: Ekg no acute abnormality, chest xray is normal, troponin is negative x 2.  Pt counseled I think pain is muscular.  Pt given 2 hydrocodone here.  He reports pain is better.    Final Clinical Impression(s) / ED Diagnoses Final diagnoses:  Chest wall pain    Rx / DC Orders ED Discharge Orders         Ordered    HYDROcodone-acetaminophen (NORCO/VICODIN) 5-325 MG tablet  Every 4 hours PRN     06/22/19 1456    methocarbamol (ROBAXIN) 500 MG tablet  2 times daily     06/22/19 1456  An After Visit Summary was printed and given to the patient.    Fransico Meadow, PA-C 06/23/19 0809    Drenda Freeze, MD 06/23/19 [phone]

## 2019-06-25 ENCOUNTER — Encounter (HOSPITAL_COMMUNITY): Admission: RE | Admit: 2019-06-25 | Payer: Medicare Other | Source: Ambulatory Visit

## 2019-06-25 ENCOUNTER — Telehealth (HOSPITAL_COMMUNITY): Payer: Self-pay

## 2019-06-25 NOTE — Telephone Encounter (Signed)
Cardiac Rehab Note:  Successful telephone encounter to Mr. Steven Lowery and sister Steven Lowery to follow up on patients recent presentation to ED 06/22/19 for chest wall pain. Per EMR documentation, cardiac concerns were ruled out and patient was diagnosed with chest wall pain and pulled muscle of the right shoulder.   Patient states he is in a moderate amount of discomfort in his chest. He continues to take his narcotic and muscle relaxant as prescribed. He denies dizziness, N/V, diaphoresis, or acute changes from when he presented to ED. RN encourages patient to contact PCP for follow up appointment ASAP. [name] and [name] agree patient is not ready to commit to in-person cardiac rehab exercise sessions at this time. [name] request appointment be cancelled and placed on medical hold. [name] states she or patient will call back when he is ready to participate.   Plan: Appointments cancelled and patient placed on Medical Hold.  Larsen Dungan E. Rollene Rotunda RN, BSN [place]. [facility]  Cardiac and Pulmonary Rehabilitation [place] Direct: [phone]

## 2019-06-28 ENCOUNTER — Encounter (HOSPITAL_COMMUNITY): Payer: Medicare Other

## 2019-06-30 ENCOUNTER — Encounter (HOSPITAL_COMMUNITY): Payer: Medicare Other

## 2019-07-01 ENCOUNTER — Telehealth: Payer: Self-pay | Admitting: *Deleted

## 2019-07-01 NOTE — Telephone Encounter (Signed)
Wife called stating she received a call from this number yesterday and no message. Stated patient has appointment on 07/02/19@11 :30am [facility]. I did not see this appointment on his Epic. She did not know where this appointment was scheduled from. Suggested she keep appointment for him.

## 2019-07-02 ENCOUNTER — Encounter (HOSPITAL_COMMUNITY): Payer: Medicare Other

## 2019-07-04 ENCOUNTER — Other Ambulatory Visit: Payer: Self-pay

## 2019-07-04 ENCOUNTER — Ambulatory Visit: Payer: Medicare Other | Attending: Internal Medicine

## 2019-07-04 DIAGNOSIS — Z23 Encounter for immunization: Secondary | ICD-10-CM | POA: Insufficient documentation

## 2019-07-04 NOTE — Progress Notes (Signed)
   Covid-19 Vaccination Clinic  Name:  ELBIE WICHERS    MRN: [id number] DOB: [birth year]  07/04/2019  Mr. Leventry was observed post Covid-19 immunization for 15 minutes without incidence. He was provided with Vaccine Information Sheet and instruction to access the V-Safe system.   Mr. Rossen was instructed to call 911 with any severe reactions post vaccine: [name] Difficulty breathing  . Swelling of your face and throat  . A fast heartbeat  . A bad rash all over your body  . Dizziness and weakness    Immunizations Administered    Name Date Dose VIS Date Route   Pfizer COVID-19 Vaccine 07/04/2019  3:47 PM 0.3 mL 04/16/2019 Intramuscular   Manufacturer: [employer], [employer]   Lot: [id number]   [place]: [phone]

## 2019-07-05 ENCOUNTER — Other Ambulatory Visit: Payer: Self-pay | Admitting: Nurse Practitioner

## 2019-07-05 ENCOUNTER — Telehealth: Payer: Self-pay | Admitting: Family Medicine

## 2019-07-05 ENCOUNTER — Encounter (HOSPITAL_COMMUNITY): Payer: Medicare Other

## 2019-07-05 NOTE — Telephone Encounter (Signed)
Hi  We do not prescribe him with any narcotics.

## 2019-07-06 ENCOUNTER — Telehealth: Payer: Self-pay | Admitting: Nurse Practitioner

## 2019-07-06 NOTE — Telephone Encounter (Signed)
Pt was called and reminded of there appointment 

## 2019-07-07 ENCOUNTER — Ambulatory Visit (INDEPENDENT_AMBULATORY_CARE_PROVIDER_SITE_OTHER): Payer: Medicare Other | Admitting: Nurse Practitioner

## 2019-07-07 ENCOUNTER — Other Ambulatory Visit: Payer: Self-pay

## 2019-07-07 ENCOUNTER — Encounter (HOSPITAL_COMMUNITY): Payer: Medicare Other

## 2019-07-07 ENCOUNTER — Encounter: Payer: Self-pay | Admitting: Nurse Practitioner

## 2019-07-07 VITALS — BP 158/82 | HR 64 | Temp 98.4°F | Resp 16 | Ht 74.0 in | Wt 184.0 lb

## 2019-07-07 DIAGNOSIS — Z87891 Personal history of nicotine dependence: Secondary | ICD-10-CM | POA: Diagnosis not present

## 2019-07-07 DIAGNOSIS — R338 Other retention of urine: Secondary | ICD-10-CM

## 2019-07-07 DIAGNOSIS — I214 Non-ST elevation (NSTEMI) myocardial infarction: Secondary | ICD-10-CM | POA: Diagnosis not present

## 2019-07-07 DIAGNOSIS — N401 Enlarged prostate with lower urinary tract symptoms: Secondary | ICD-10-CM

## 2019-07-07 DIAGNOSIS — R072 Precordial pain: Secondary | ICD-10-CM

## 2019-07-07 DIAGNOSIS — Z0000 Encounter for general adult medical examination without abnormal findings: Secondary | ICD-10-CM

## 2019-07-07 DIAGNOSIS — I10 Essential (primary) hypertension: Secondary | ICD-10-CM | POA: Diagnosis not present

## 2019-07-07 LAB — POCT URINALYSIS DIPSTICK
Bilirubin, UA: NEGATIVE
Glucose, UA: NEGATIVE
Ketones, UA: NEGATIVE
Leukocytes, UA: NEGATIVE
Nitrite, UA: NEGATIVE
Protein, UA: NEGATIVE
Spec Grav, UA: 1.02 (ref 1.010–1.025)
Urobilinogen, UA: 0.2 E.U./dL
pH, UA: 5.5 (ref 5.0–8.0)

## 2019-07-07 MED ORDER — METHOCARBAMOL 500 MG PO TABS: 500.0000 mg | ORAL_TABLET | Freq: Three times a day (TID) | ORAL | 0 refills | Status: DC | PRN

## 2019-07-07 NOTE — Patient Instructions (Signed)
Angina  Angina is very bad discomfort or pain in the chest, neck, arm, jaw, or back. The discomfort is caused by a lack of blood in the middle layer of the heart wall (myocardium). What are the causes? This condition is caused by a buildup of fat and cholesterol (plaque) in your arteries (atherosclerosis). This buildup narrows the arteries and makes it hard for blood to flow. What increases the risk? You are more likely to develop this condition if:  You have high levels of cholesterol in your blood.  You have high blood pressure (hypertension).  You have diabetes.  You have a family history of heart disease.  You are not active, or you do not exercise enough.  You feel sad (depressed).  You have been treated with high energy rays (radiation) on the left side of your chest. Other risk factors are:  Using tobacco.  Being very overweight (obese).  Eating a diet high in unhealthy fats (saturated fats).  Having stress, or being exposed to things that cause stress.  Using drugs, such as cocaine. Women have a greater risk for angina if:  They are older than 55.  They have stopped having their period (are in postmenopause). What are the signs or symptoms? Common symptoms of this condition in both men and women may include:  Chest pain, which may: ? Feel like a crushing or squeezing in the chest. ? Feel like a tightness, pressure, fullness, or heaviness in the chest. ? Last for more than a few minutes at a time. ? Stop and come back (recur) after a few minutes.  Pain in the neck, arm, jaw, or back.  Heartburn or upset stomach (indigestion) for no reason.  Being short of breath.  Feeling sick to your stomach (nauseous).  Sudden cold sweats. Women and people with diabetes may have other symptoms that are not usual, such as feeling:  Tired (fatigue).  Worried or nervous (anxious) for no reason.  Weak for no reason.  Dizzy or passing out (fainting). How is this  treated? This condition may be treated with:  Medicines. These are given to: ? Prevent blood clots. ? Prevent heart attack. ? Relax blood vessels and improve blood flow to the heart (nitrates). ? Reduce blood pressure. ? Improve the pumping action of the heart. ? Reduce fat and cholesterol in the blood.  A procedure to widen a narrowed or blocked artery in the heart (angioplasty).  Surgery to allow blood to go around a blocked artery (coronary artery bypass surgery). Follow these instructions at home: Medicines  Take over-the-counter and prescription medicines only as told by your doctor.  Do not take these medicines unless your doctor says that you can: ? NSAIDs. These include:  Ibuprofen.  Naproxen. ? Vitamin supplements that have vitamin A, vitamin E, or both. ? Hormone therapy that contains estrogen with or without progestin. Eating and drinking   Eat a heart-healthy diet that includes: ? Lots of fresh fruits and vegetables. ? Whole grains. ? Low-fat (lean) protein. ? Low-fat dairy products.  Follow instructions from your doctor about what you cannot eat or drink. Activity  Follow an exercise program that your doctor tells you.  Talk with your doctor about joining a program to help improve the health of your heart (cardiac rehab).  When you feel tired, take a break. Plan breaks if you know you are going to feel tired. Lifestyle   Do not use any products that contain nicotine or tobacco. This includes cigarettes, e-cigarettes, and   chewing tobacco. If you need help quitting, ask your doctor.  If your doctor says you can drink alcohol: ? Limit how much you use to:  0-1 drink a day for women who are not pregnant.  0-2 drinks a day for men. ? Be aware of how much alcohol is in your drink. In the [place] one drink equals:  One 12 oz bottle of beer (355 mL).  One 5 oz glass of wine (148 mL).  One 1 oz glass of hard liquor (44 mL). General instructions  Stay  at a healthy weight. If your doctor tells you to do so, work with him or her to lose weight.  Learn to deal with stress. If you need help, ask your doctor.  Keep your vaccines up to date. Get a flu shot every year.  Talk with your doctor if you feel sad. Take a screening test to see if you are at risk for depression.  Work with your doctor to manage any other health problems that you have. These may include diabetes or high blood pressure.  Keep all follow-up visits as told by your doctor. This is important. Get help right away if:  You have pain in your chest, neck, arm, jaw, or back, and the pain: ? Lasts more than a few minutes. ? Comes back. ? Does not get better after you take medicine under your tongue (sublingual nitroglycerin). ? Keeps getting worse. ? Comes more often.  You have any of these problems for no reason: ? Sweating a lot. ? Heartburn or upset stomach. ? Shortness of breath. ? Trouble breathing. ? Feeling sick to your stomach. ? Throwing up (vomiting). ? Feeling more tired than normal. ? Feeling nervous or worrying more than normal. ? Weakness.  You are suddenly dizzy or light-headed.  You pass out. These symptoms may be an emergency. Do not wait to see if the symptoms will go away. Get medical help right away. Call your local emergency services (911 in the [place] Do not drive yourself to the hospital. Summary  Angina is very bad discomfort or pain in the chest, neck, arm, neck, or back.  Symptoms include chest pain, heartburn or upset stomach for no reason, and shortness of breath.  Women or people with diabetes may have symptoms that are not usual, such as feeling nervous or worried for no reason, weak for no reason, or tired.  Take all medicines only as told by your doctor.  You should eat a heart-healthy diet and follow an exercise program. This information is not intended to replace advice given to you by your health care provider. Make sure you  discuss any questions you have with your health care provider. Document Revised: 12/08/2017 Document Reviewed: 12/08/2017 Elsevier Patient Education  2020 [employer]  

## 2019-07-07 NOTE — Progress Notes (Addendum)
Established Patient Office Visit  Subjective:  Patient ID: Steven Lowery, male    DOB: 04-06-1946  Age: 74 y.o. MRN: [id number]  CC:  Chief Complaint  Patient presents with  . Follow-up    was seen in ER for chest pain. Asking for refills on hydrocodone and methocarbamol     HPI Steven Lowery presents for follow up. He was seen in the ER for chest pain.  He  has a past medical history of Acute asthmatic bronchitis, Alcohol abuse, Back pain, Benign prostatic hypertrophy, Cigarette smoker, Coronary artery disease, DJD (degenerative joint disease), Hypertension, IBS (irritable bowel syndrome), Myocardial infarction [facility]), Neck pain, and Psychiatric disorder.  He admits that he quit smoking.  He did this using the NicoDerm patch over-the-counter.  He did have a local skin reaction but this is resolving.  He has recently had his first vaccination against coronavirus.  His recent ER visit was due to chest pains.  He admits his work-up was negative and he was diagnosed with a pulled muscle.  He admits that this is a part of his history.  The pain initially was in his chest, back and neck.  He still has occasional chest pains with certain movements.  Right now he continues to have 7 out of 10 right neck and left lower back pain.  He denies headache, dizziness, visual changes, dyspnea on exertion, nausea, vomiting or any edema.   He has a history of BPH currently on tamsulosin 0.4 mg capsule daily.  He is having some urinary symptoms of hesitancy.  His urinalysis does show trace blood.  He is requesting an adjustment of his current dose.  Denies pelvic pain, dysuria or nocturia.   Past Medical History:  Diagnosis Date  . Acute asthmatic bronchitis   . Alcohol abuse   . Back pain   . Benign prostatic hypertrophy   . Cigarette smoker   . Coronary artery disease   . DJD (degenerative joint disease)   . Hypertension   . IBS (irritable bowel syndrome)   . Myocardial infarction [facility])    per pt  he thinks he had an MI "5 years ago"  . Neck pain   . Psychiatric disorder     Past Surgical History:  Procedure Laterality Date  . CARDIAC CATHETERIZATION    . CORONARY STENT INTERVENTION N/A 04/12/2019   Procedure: CORONARY STENT INTERVENTION;  Surgeon: Lorretta Harp, MD;  Location: [place] [facility];  Service: Cardiovascular;  Laterality: N/A;  . CYSTOSCOPY  1999   Dr. Janice Norrie  . HAND SURGERY    . LEFT HEART CATH AND CORONARY ANGIOGRAPHY N/A 04/12/2019   Procedure: LEFT HEART CATH AND CORONARY ANGIOGRAPHY;  Surgeon: Lorretta Harp, MD;  Location: [place] [facility];  Service: Cardiovascular;  Laterality: N/A;  . TONSILLECTOMY      Family History  Problem Relation Age of Onset  . Breast cancer Sister   . Heart disease Sister   . Heart attack Father   . Heart disease Brother   . Colon cancer Neg Hx   . Esophageal cancer Neg Hx   . Rectal cancer Neg Hx   . Stomach cancer Neg Hx     Social History   Socioeconomic History  . Marital status: Married    Spouse name: Not on file  . Number of children: Not on file  . Years of education: 34  . Highest education level: 11th grade  Occupational History  . Occupation: Retired  Tobacco  Use  . Smoking status: Former Smoker    Packs/day: 0.30    Years: 40.00    Pack years: 12.00    Types: Cigarettes    Quit date: 05/24/2019    Years since quitting: 0.1  . Smokeless tobacco: Never Used  . Tobacco comment: has cut back alot per pt  Substance and Sexual Activity  . Alcohol use: Yes    Alcohol/week: 6.0 standard drinks    Types: 6 Shots of liquor per week    Comment: quit 3 months ago  . Drug use: No  . Sexual activity: Yes  Other Topics Concern  . Not on file  Social History Narrative  . Not on file   Social Determinants of Health   Financial Resource Strain:   . Difficulty of Paying Living Expenses: Not on file  Food Insecurity:   . Worried About Charity fundraiser in the Last Year: Not on file  . Ran Out of  Food in the Last Year: Not on file  Transportation Needs:   . Lack of Transportation (Medical): Not on file  . Lack of Transportation (Non-Medical): Not on file  Physical Activity:   . Days of Exercise per Week: Not on file  . Minutes of Exercise per Session: Not on file  Stress:   . Feeling of Stress : Not on file  Social Connections:   . Frequency of Communication with Friends and Family: Not on file  . Frequency of Social Gatherings with Friends and Family: Not on file  . Attends Religious Services: Not on file  . Active Member of Clubs or Organizations: Not on file  . Attends Archivist Meetings: Not on file  . Marital Status: Not on file  Intimate Partner Violence:   . Fear of Current or Ex-Partner: Not on file  . Emotionally Abused: Not on file  . Physically Abused: Not on file  . Sexually Abused: Not on file    Outpatient Medications Prior to Visit  Medication Sig Dispense Refill  . albuterol (VENTOLIN HFA) 108 (90 Base) MCG/ACT inhaler Inhale 1-2 puffs into the lungs every 6 (six) hours as needed for wheezing or shortness of breath. 1 g 0  . Ascorbic Acid (VITAMIN C PO) Take 1 tablet by mouth daily.    [name] aspirin EC 81 MG tablet Take 1 tablet (81 mg total) by mouth daily. 90 tablet 3  . atorvastatin (LIPITOR) 40 MG tablet Take 1 tablet (40 mg total) by mouth daily at 6 PM. 90 tablet 3  . docusate sodium (COLACE) 100 MG capsule Take 100 mg by mouth daily.    . ferrous sulfate 325 (65 FE) MG tablet Take 325 mg by mouth daily with breakfast.    . fish oil-omega-3 fatty acids 1000 MG capsule Take 1 g by mouth daily.     [name] FLUTICASONE PROPIONATE, NASAL, NA Place into the nose.    [name] HYDROcodone-acetaminophen (NORCO/VICODIN) 5-325 MG tablet Take 2 tablets by mouth every 4 (four) hours as needed. 10 tablet 0  . lidocaine (XYLOCAINE) 5 % ointment Apply 1 application topically 4 (four) times daily as needed. 35.44 g 0  . losartan (COZAAR) 25 MG tablet Take 1 tablet (25 mg  total) by mouth daily. 90 tablet 3  . Multiple Vitamin (MULTIVITAMIN WITH MINERALS) TABS tablet Take 1 tablet by mouth daily.    . nitroGLYCERIN (NITROSTAT) 0.4 MG SL tablet Place 1 tablet (0.4 mg total) under the tongue every 5 (five) minutes as needed  for chest pain. 90 tablet 3  . omeprazole (PRILOSEC) 20 MG capsule Take 20 mg by mouth daily.    [name] OVER THE COUNTER MEDICATION Place 1 drop into both eyes daily as needed (Eye burning). Reported on 09/11/2015    . sildenafil (VIAGRA) 25 MG tablet Take 1 tablet (25 mg total) by mouth daily as needed for erectile dysfunction. 20 tablet 1  . tamsulosin (FLOMAX) 0.4 MG CAPS capsule TAKE 1 CAPSULE BY MOUTH DAILY 90 capsule 3  . ticagrelor (BRILINTA) 90 MG TABS tablet Take 1 tablet (90 mg total) by mouth 2 (two) times daily. 180 tablet 3  . methocarbamol (ROBAXIN) 500 MG tablet Take 1 tablet (500 mg total) by mouth 2 (two) times daily. 20 tablet 0  . gabapentin (NEURONTIN) 300 MG capsule Take 1 capsule (300 mg total) by mouth 3 (three) times daily. (Patient not taking: Reported on 05/17/2019) 180 capsule 5   Facility-Administered Medications Prior to Visit  Medication Dose Route Frequency Provider Last Rate Last Admin  . 0.9 %  sodium chloride infusion  500 mL Intravenous Continuous Ladene Artist, MD        Allergies  Allergen Reactions  . Lisinopril Cough    ROS Review of Systems  All other systems reviewed and are negative.     Objective:    Physical Exam  Constitutional: He is oriented to person, place, and time. He appears well-developed and well-nourished.  HENT:  Head: Normocephalic.  Cardiovascular: Normal rate, regular rhythm and normal heart sounds.  Pulmonary/Chest: Effort normal and breath sounds normal.  Musculoskeletal:        General: Normal range of motion.     Cervical back: Normal range of motion.  Neurological: He is alert and oriented to person, place, and time. He has normal reflexes.  Skin: Skin is warm and dry.   Psychiatric: He has a normal mood and affect. His behavior is normal. Judgment and thought content normal.    BP (!) 158/82 (BP Location: Left Arm, Patient Position: Sitting, Cuff Size: Large)   Pulse 64   Temp 98.4 F (36.9 C) (Oral)   Resp 16   Ht 6\' 2"  (1.88 m)   Wt 184 lb (83.5 kg)   SpO2 100%   BMI 23.62 kg/m  Wt Readings from Last 3 Encounters:  07/07/19 184 lb (83.5 kg)  06/22/19 180 lb 12.4 oz (82 kg)  05/27/19 181 lb 3.5 oz (82.2 kg)     There are no preventive care reminders to display for this patient.  There are no preventive care reminders to display for this patient.  Lab Results  Component Value Date   TSH 0.946 04/17/2015   Lab Results  Component Value Date   WBC 4.8 06/22/2019   HGB 14.1 06/22/2019   HCT 41.9 06/22/2019   MCV 92.5 06/22/2019   PLT 258 06/22/2019   Lab Results  Component Value Date   NA 139 06/22/2019   K 4.1 06/22/2019   CO2 24 06/22/2019   GLUCOSE 98 06/22/2019   BUN 10 06/22/2019   CREATININE 1.15 06/22/2019   BILITOT 0.3 02/02/2018   ALKPHOS 86 02/02/2018   AST 24 02/02/2018   ALT 20 02/02/2018   PROT 6.3 02/02/2018   ALBUMIN 4.1 02/02/2018   CALCIUM 9.2 06/22/2019   ANIONGAP 9 06/22/2019   GFR 84.97 10/27/2012   Lab Results  Component Value Date   CHOL 149 04/11/2019   Lab Results  Component Value Date   HDL 73 04/11/2019  Lab Results  Component Value Date   LDLCALC 69 04/11/2019   Lab Results  Component Value Date   TRIG 37 04/11/2019   Lab Results  Component Value Date   CHOLHDL 2.0 04/11/2019   Lab Results  Component Value Date   HGBA1C 5.9 (H) 04/11/2019      Assessment & Plan:   Problem List Items Addressed This Visit      High   BPH (benign prostatic hyperplasia) - Primary   Relevant Orders   PSA (Completed)   Urinalysis Dipstick (Completed)   Essential hypertension   Relevant Orders   Urinalysis Dipstick (Completed)   NSTEMI (non-ST elevated myocardial infarction) (HCC)    Precordial chest pain   Relevant Medications   methocarbamol (ROBAXIN) 500 MG tablet   Other Relevant Orders   Magnesium (Completed)   Vitamin B12 (Completed)    Other Visit Diagnoses    Quit smoking       accomplished 06/2019.   Healthcare maintenance          Meds ordered this encounter  Medications  . methocarbamol (ROBAXIN) 500 MG tablet    Sig: Take 1 tablet (500 mg total) by mouth every 8 (eight) hours as needed for muscle spasms.    Dispense:  30 tablet    Refill:  0    Order Specific Question:   Supervising Provider    Answer:   Tresa Garter [id number]    Follow-up: Return for Appointment As Scheduled.    Vevelyn Francois, NP

## 2019-07-08 ENCOUNTER — Telehealth: Payer: Self-pay

## 2019-07-08 ENCOUNTER — Other Ambulatory Visit: Payer: Self-pay | Admitting: Nurse Practitioner

## 2019-07-08 DIAGNOSIS — R972 Elevated prostate specific antigen [PSA]: Secondary | ICD-10-CM

## 2019-07-08 DIAGNOSIS — N401 Enlarged prostate with lower urinary tract symptoms: Secondary | ICD-10-CM

## 2019-07-08 DIAGNOSIS — R3129 Other microscopic hematuria: Secondary | ICD-10-CM

## 2019-07-08 DIAGNOSIS — R3911 Hesitancy of micturition: Secondary | ICD-10-CM

## 2019-07-08 LAB — VITAMIN B12: Vitamin B-12: 506 pg/mL (ref 232–1245)

## 2019-07-08 LAB — PSA: Prostate Specific Ag, Serum: 4.5 ng/mL — ABNORMAL HIGH (ref 0.0–4.0)

## 2019-07-08 LAB — MAGNESIUM: Magnesium: 1.8 mg/dL (ref 1.6–2.3)

## 2019-07-08 NOTE — Telephone Encounter (Signed)
Called and spoke with patient, advised of hematuria, hesitancy and slightly elevated PSA. Advised that we are sending him to urology for further evaluation. Thanks!

## 2019-07-08 NOTE — Telephone Encounter (Signed)
-----   Message from Vevelyn Francois, NP sent at 07/08/2019 12:19 PM EST ----- Please make Mr. Steven Lowery aware that with his trace hematuria, hesitancy and slightly elevated PSA I would like for him to see a urologist for further evaluation.  It looks like he has had this before but I am not sure if he is ever seen urology.  The order is in.  Tell him we will not make any changes to his tamsulosin but they may.

## 2019-07-09 ENCOUNTER — Encounter (HOSPITAL_COMMUNITY): Payer: Medicare Other

## 2019-07-12 ENCOUNTER — Encounter (HOSPITAL_COMMUNITY): Payer: Medicare Other

## 2019-07-14 ENCOUNTER — Encounter (HOSPITAL_COMMUNITY): Payer: Medicare Other

## 2019-07-16 ENCOUNTER — Other Ambulatory Visit: Payer: Self-pay | Admitting: Nurse Practitioner

## 2019-07-16 ENCOUNTER — Encounter (HOSPITAL_COMMUNITY): Payer: Medicare Other

## 2019-07-19 ENCOUNTER — Encounter (HOSPITAL_COMMUNITY): Payer: Medicare Other

## 2019-07-21 ENCOUNTER — Encounter (HOSPITAL_COMMUNITY): Payer: Medicare Other

## 2019-07-21 NOTE — Progress Notes (Signed)
Virtual Visit via Telephone Note   This visit type was conducted due to national recommendations for restrictions regarding the COVID-19 Pandemic (e.g. social distancing) in an effort to limit this patient's exposure and mitigate transmission in our community.  Due to his co-morbid illnesses, this patient is at least at moderate risk for complications without adequate follow up.  This format is felt to be most appropriate for this patient at this time.  The patient did not have access to video technology/had technical difficulties with video requiring transitioning to audio format only (telephone).  All issues noted in this document were discussed and addressed.  No physical exam could be performed with this format.  Please refer to the patient's chart for his  consent to telehealth for [facility].   The patient was identified using 2 identifiers.  Date:  07/26/2019   ID:  Steven Lowery, DOB [birth year], MRN [id number]  Patient Location: Home Provider Location: Home  PCP:  Vevelyn Francois, NP  Cardiologist:  Taisia Fantini [place], MD  Electrophysiologist:  None   Evaluation Performed:  Follow-Up Visit  Chief Complaint:  CAD  History of Present Illness:    Steven Lowery is a 74 y.o. male with history of CAD s/p  NSTEMI s/p DES to LAD on 04/12/2019, hypertension, prediabetes, asthma, tobacco abuse, and alcohol abuse.  Patient admitted from 04/09/2019 to 04/13/2019 for NSTEMI after presenting with a 3 day history of non-radiating chest pain. Troponin mildly elevated and peaked at 84 but remained relatively flat. Therefore, he underwent Lexiscan Myoview on 04/10/2019 which showed a large fixed perfusion defect in inferior portions of myocardium suggestive of prior MI and a large perfusion defect with partial reversibility in the anterior wall, anteroseptum, and apex suggestive of ischemia. Considered high risk study so cardiac catheterization was performed on 04/12/2019 and showed 90% stenosis of  mid LAD. LV systolic function and LVEDP were normal. Patient underwent successful PCI with DES to this lesion. He tolerated the procedure well but later that evening he did develop brief episodes of bradycardia with junctional rhythm with heart rates dropping to the 30's and had associated dizziness and lightheadedness with this. Therefore, beta blocker was not added. Patient was discharged on Aspirin 81mg  daily, Brilinta 90mg  twice daily, Losartan 25mg  daily, and Lipitor 40mg  daily. When seen in follow up in December he was doing well but still smoking. He was seen in the ED in February with some chest pain that was felt to be muscular.   On follow up today he complains of persistent SOB that persists all day. No cough. No swelling or weight gain. Has lost 6 lbs. He is no longer smoking. He has no chest pain.  The patient does not have symptoms concerning for COVID-19 infection (fever, chills, cough, or new shortness of breath).    Past Medical History:  Diagnosis Date  . Acute asthmatic bronchitis   . Alcohol abuse   . Back pain   . Benign prostatic hypertrophy   . Cigarette smoker   . Coronary artery disease   . DJD (degenerative joint disease)   . Hypertension   . IBS (irritable bowel syndrome)   . Myocardial infarction [facility])    per pt he thinks he had an MI "5 years ago"  . Neck pain   . Psychiatric disorder    Past Surgical History:  Procedure Laterality Date  . CARDIAC CATHETERIZATION    . CORONARY STENT INTERVENTION N/A 04/12/2019   Procedure: CORONARY STENT INTERVENTION;  Surgeon: Lorretta Harp, MD;  Location: [place] [facility];  Service: Cardiovascular;  Laterality: N/A;  . CYSTOSCOPY  1999   Dr. Janice Norrie  . HAND SURGERY    . LEFT HEART CATH AND CORONARY ANGIOGRAPHY N/A 04/12/2019   Procedure: LEFT HEART CATH AND CORONARY ANGIOGRAPHY;  Surgeon: Lorretta Harp, MD;  Location: [place] [facility];  Service: Cardiovascular;  Laterality: N/A;  . TONSILLECTOMY       Current  Meds  Medication Sig  . albuterol (VENTOLIN HFA) 108 (90 Base) MCG/ACT inhaler Inhale 1-2 puffs into the lungs every 6 (six) hours as needed for wheezing or shortness of breath.  . Ascorbic Acid (VITAMIN C PO) Take 1 tablet by mouth daily.  [name] aspirin EC 81 MG tablet Take 1 tablet (81 mg total) by mouth daily.  [name] atorvastatin (LIPITOR) 40 MG tablet Take 1 tablet (40 mg total) by mouth daily at 6 PM.  . docusate sodium (COLACE) 100 MG capsule Take 100 mg by mouth daily.  . ferrous sulfate 325 (65 FE) MG tablet Take 325 mg by mouth daily with breakfast.  . fish oil-omega-3 fatty acids 1000 MG capsule Take 1 g by mouth daily.   [name] FLUTICASONE PROPIONATE, NASAL, NA Place into the nose.  . lidocaine (XYLOCAINE) 5 % ointment Apply 1 application topically 4 (four) times daily as needed.  . Multiple Vitamin (MULTIVITAMIN WITH MINERALS) TABS tablet Take 1 tablet by mouth daily.  [name] omeprazole (PRILOSEC) 20 MG capsule Take 20 mg by mouth daily.  [name] OVER THE COUNTER MEDICATION Place 1 drop into both eyes daily as needed (Eye burning). Reported on 09/11/2015  . [DISCONTINUED] albuterol (VENTOLIN HFA) 108 (90 Base) MCG/ACT inhaler Inhale 1-2 puffs into the lungs every 6 (six) hours as needed for wheezing or shortness of breath.  . [DISCONTINUED] losartan (COZAAR) 25 MG tablet Take 1 tablet (25 mg total) by mouth daily.  . [DISCONTINUED] ticagrelor (BRILINTA) 90 MG TABS tablet Take 1 tablet (90 mg total) by mouth 2 (two) times daily.   Current Facility-Administered Medications for the 07/26/19 encounter (Telemedicine) with Martin[place], Donta Mcinroy M, MD  Medication  . 0.9 %  sodium chloride infusion     Allergies:   Lisinopril   Social History   Tobacco Use  . Smoking status: Former Smoker    Packs/day: 0.30    Years: 40.00    Pack years: 12.00    Types: Cigarettes    Quit date: 05/24/2019    Years since quitting: 0.1  . Smokeless tobacco: Never Used  . Tobacco comment: has cut back alot per pt  Substance Use  Topics  . Alcohol use: Yes    Alcohol/week: 6.0 standard drinks    Types: 6 Shots of liquor per week    Comment: quit 3 months ago  . Drug use: No     Family Hx: The patient's family history includes Breast cancer in his sister; Heart attack in his father; Heart disease in his brother and sister. There is no history of Colon cancer, Esophageal cancer, Rectal cancer, or Stomach cancer.  ROS:   Please see the history of present illness.    All other systems reviewed and are negative.   Prior CV studies:   The following studies were reviewed today:  Lexiscan Myoview 04/10/2019:  Defect 1: There is a large defect of severe severity present in the basal inferoseptal, basal inferior, basal inferolateral, mid inferoseptal, mid inferior, mid inferolateral, apical inferior, apical lateral and apex location.  Defect 2: There is a large defect present in the basal anterior, basal anteroseptal, mid anterior, mid anteroseptal and apex location.  Findings consistent with ischemia and prior myocardial infarction.  This is a high risk study.  The left ventricular ejection fraction is mildly decreased (45-54%).  Nuclear stress EF: 46%.  Horizontal ST segment depression ST segment depression of 2 mm was noted during stress in the II, III and aVF leads, beginning at 1 minutes of stress, and returning to baseline after 5-9 minutes of recovery.  T wave inversion was noted during stress in the II, III, aVF, V4, V5 and V6 leads. T wave inversion persisted.  There is a large fixed perfusion defect in the inferoseptum, inferior wall, and inferolateral wall from apex to base. This suggests prior myocardial infarction. Similar to what is described on the prior stress test from 2012.  There is a large perfusion defect with partial reversibility in the anterior wall, anteroseptum, and apex. At the apex the defect is primarily fixed, with reversibility from absent to severe in the anteroapex. At the  mid ventricle the anterior wall is is partially reversible from moderate to mild and at the base is reversible from moderate to normal. The anteroseptum at the mid ventricle has a severe perfusion defect at stress reversible to moderate at the mid ventricle and mild at the base. The study is positive for ischemia with partial reversibility suggesting possible peri-infarct ischemia.  Electrocardiogram had baseline T wave changes inferiorly and laterally, ST segments in these areas worsened with 2 mm of depression in aVF and 1 mm in 2, 3, V4, V5. The patient's blood pressure was elevated with a systolic blood pressure 0000000 mmHg with stress.  Ejection fraction is reduced at 46% confirmed with wall motion abnormalities inferiorly and in the anteroseptum.  High risk stress test findings given previous infarct, peri-infarct ischemia, and reduced ejection fraction as well as stress ECG findings. _______________  Left Heart Catheterization 04/12/2019:  Mid LAD lesion is 90% stenosed.  A drug-eluting stent was successfully placed using a STENT SYNERGY DES 3X20.  Post intervention, there is a 0% residual stenosis.  The left ventricular systolic function is normal.  LV end diastolic pressure is normal.  The left ventricular ejection fraction is 55-65% by visual estimate.  Impression: Successful mid LAD PCI and drug-eluting stenting using a synergy 3 mm x 20 mm long drug-eluting stent postdilated to 3.54 mm. Patient has no other CAD and has normal LV function. He can be discharged home tomorrow on dual antiplatelet therapy uninterrupted for 1 year. Dr. Angelena Form was made aware of these results. The patient left lab in stable condition.  Labs/Other Tests and Data Reviewed:    EKG:  No ECG reviewed.  Recent Labs: 06/22/2019: BUN 10; Creatinine, Ser 1.15; Hemoglobin 14.1; Platelets 258; Potassium 4.1; Sodium 139 07/07/2019: Magnesium 1.8   Recent Lipid Panel Lab Results  Component Value  Date/Time   CHOL 149 04/11/2019 04:43 AM   TRIG 37 04/11/2019 04:43 AM   HDL 73 04/11/2019 04:43 AM   CHOLHDL 2.0 04/11/2019 04:43 AM   LDLCALC 69 04/11/2019 04:43 AM    Wt Readings from Last 3 Encounters:  07/26/19 178 lb 6.4 oz (80.9 kg)  07/07/19 184 lb (83.5 kg)  06/22/19 180 lb 12.4 oz (82 kg)     Objective:    Vital Signs:  BP (!) 139/103   Pulse 78   Ht 6\' 2"  (1.88 m)   Wt 178 lb 6.4 oz (80.9 kg)  SpO2 98%   BMI 22.91 kg/m    VITAL SIGNS:  reviewed  ASSESSMENT & PLAN:    1. CAD s/p NSTEMI in December 2020 with stenting of LAD with DES -  Cardiac catheterization showed 90% stenosis of LAD which was treated with DES.  - No recurrent chest pain. - I suspect his persistent SOB is related to Brilinta. Will discontinue Brilinta and start Plavix with a 300 mg load then 75 mg daily - Continue aggressive secondary prevention. LDL 69 on 04/11/2019 on fish oil alone which is at goal of <70.  Lipitor 40mg  daily added during admission. Will need repeat lipid panel/LFTs now.  No beta-blocker due to bradycardia.   2. Hypertension - BP has been higher this month. - will increase Losartan to 50 mg daily.  3. Prediabetes - Hemoglobin A1c 5.9 during recent admission. - Discussed importance of lifestyle modification including staying active and following heart healthy diet. - Follow-up with PCP.  4. Tobacco Abuse - Patient reports he has quit.  - Congratulated patient .      COVID-19 Education: The signs and symptoms of COVID-19 were discussed with the patient and how to seek care for testing (follow up with PCP or arrange E-visit).  The importance of social distancing was discussed today.  Time:   Today, I have spent 15 minutes with the patient with telehealth technology discussing the above problems.     Medication Adjustments/Labs and Tests Ordered: Current medicines are reviewed at length with the patient today.  Concerns regarding medicines are outlined above.     Tests Ordered: No orders of the defined types were placed in this encounter.   Medication Changes: Meds ordered this encounter  Medications  . albuterol (VENTOLIN HFA) 108 (90 Base) MCG/ACT inhaler    Sig: Inhale 1-2 puffs into the lungs every 6 (six) hours as needed for wheezing or shortness of breath.    Dispense:  1 g    Refill:  3  . losartan (COZAAR) 50 MG tablet    Sig: Take 1 tablet (50 mg total) by mouth daily.    Dispense:  90 tablet    Refill:  3  . clopidogrel (PLAVIX) 75 MG tablet    Sig: Take 1 tablet (75 mg total) by mouth daily.    Dispense:  90 tablet    Refill:  3    Follow Up:  In Person in 3 month(s)  Signed, Jaamal Farooqui [place], MD  07/26/2019 9:09 AM    [place]

## 2019-07-23 ENCOUNTER — Encounter (HOSPITAL_COMMUNITY): Payer: Medicare Other

## 2019-07-26 ENCOUNTER — Encounter: Payer: Self-pay | Admitting: Cardiology

## 2019-07-26 ENCOUNTER — Ambulatory Visit: Payer: Medicare Other | Admitting: Cardiology

## 2019-07-26 ENCOUNTER — Telehealth (INDEPENDENT_AMBULATORY_CARE_PROVIDER_SITE_OTHER): Payer: Medicare Other | Admitting: Cardiology

## 2019-07-26 ENCOUNTER — Encounter (HOSPITAL_COMMUNITY): Payer: Medicare Other

## 2019-07-26 VITALS — BP 139/103 | HR 78 | Ht 74.0 in | Wt 178.4 lb

## 2019-07-26 DIAGNOSIS — R7303 Prediabetes: Secondary | ICD-10-CM

## 2019-07-26 DIAGNOSIS — I252 Old myocardial infarction: Secondary | ICD-10-CM

## 2019-07-26 DIAGNOSIS — Z955 Presence of coronary angioplasty implant and graft: Secondary | ICD-10-CM

## 2019-07-26 DIAGNOSIS — I10 Essential (primary) hypertension: Secondary | ICD-10-CM | POA: Diagnosis not present

## 2019-07-26 DIAGNOSIS — I2510 Atherosclerotic heart disease of native coronary artery without angina pectoris: Secondary | ICD-10-CM | POA: Diagnosis not present

## 2019-07-26 DIAGNOSIS — Z720 Tobacco use: Secondary | ICD-10-CM

## 2019-07-26 DIAGNOSIS — R0602 Shortness of breath: Secondary | ICD-10-CM

## 2019-07-26 MED ORDER — ALBUTEROL SULFATE HFA 108 (90 BASE) MCG/ACT IN AERS: 1.0000 | INHALATION_SPRAY | Freq: Four times a day (QID) | RESPIRATORY_TRACT | 3 refills | Status: AC | PRN

## 2019-07-26 MED ORDER — CLOPIDOGREL BISULFATE 75 MG PO TABS: 75.0000 mg | ORAL_TABLET | Freq: Every day | ORAL | 3 refills | Status: DC

## 2019-07-26 MED ORDER — LOSARTAN POTASSIUM 50 MG PO TABS: 50.0000 mg | ORAL_TABLET | Freq: Every day | ORAL | 3 refills | Status: DC

## 2019-07-26 NOTE — Patient Instructions (Addendum)
Stop taking Brilinta  Start Plavix. Take 300 mg the first day (4 tablets) then 1 tablet a day  Increase losartan to 50 mg daily  We will check fasting lab work  Follow up in 3 months    ( Monday 10/18/19 at 10:40 am )

## 2019-07-26 NOTE — Addendum Note (Signed)
Addended by: Kathyrn Lass on: 07/26/2019 10:20 AM   Modules accepted: Orders

## 2019-07-28 ENCOUNTER — Encounter (HOSPITAL_COMMUNITY): Payer: Medicare Other

## 2019-07-30 ENCOUNTER — Encounter (HOSPITAL_COMMUNITY): Payer: Medicare Other

## 2019-08-02 ENCOUNTER — Encounter (HOSPITAL_COMMUNITY): Payer: Medicare Other

## 2019-08-02 ENCOUNTER — Other Ambulatory Visit: Payer: Self-pay

## 2019-08-02 MED ORDER — NITROGLYCERIN 0.4 MG SL SUBL: 0.4000 mg | SUBLINGUAL_TABLET | SUBLINGUAL | 25 refills | Status: AC | PRN

## 2019-08-03 ENCOUNTER — Ambulatory Visit: Payer: Medicare Other | Attending: Internal Medicine

## 2019-08-03 DIAGNOSIS — Z23 Encounter for immunization: Secondary | ICD-10-CM

## 2019-08-03 NOTE — Progress Notes (Signed)
   Covid-19 Vaccination Clinic  Name:  Steven Lowery    MRN: [id number] DOB: [birth year]  08/03/2019  Steven Lowery was observed post Covid-19 immunization for 15 minutes without incident. He was provided with Vaccine Information Sheet and instruction to access the V-Safe system.   Steven Lowery was instructed to call 911 with any severe reactions post vaccine: [name] Difficulty breathing  . Swelling of face and throat  . A fast heartbeat  . A bad rash all over body  . Dizziness and weakness   Immunizations Administered    Name Date Dose VIS Date Route   Pfizer COVID-19 Vaccine 08/03/2019 10:00 AM 0.3 mL 04/16/2019 Intramuscular   Manufacturer: [employer]   Lot: [id number]   [place]: [id number]

## 2019-08-04 ENCOUNTER — Encounter (HOSPITAL_COMMUNITY): Payer: Medicare Other

## 2019-08-06 ENCOUNTER — Encounter (HOSPITAL_COMMUNITY): Payer: Medicare Other

## 2019-08-09 ENCOUNTER — Encounter (HOSPITAL_COMMUNITY): Payer: Medicare Other

## 2019-08-11 ENCOUNTER — Encounter (HOSPITAL_COMMUNITY): Payer: Medicare Other

## 2019-08-12 ENCOUNTER — Telehealth: Payer: Self-pay | Admitting: Nurse Practitioner

## 2019-08-12 NOTE — Telephone Encounter (Signed)
Called Pt several times to reschedule no answer or voicemail

## 2019-08-12 NOTE — Telephone Encounter (Signed)
Called Pt several times no answer or voicemail

## 2019-08-12 NOTE — Telephone Encounter (Signed)
Called Pt several times to reschedule appointment no answer  or voicemail

## 2019-08-13 ENCOUNTER — Encounter (HOSPITAL_COMMUNITY): Payer: Medicare Other

## 2019-08-16 ENCOUNTER — Encounter (HOSPITAL_COMMUNITY): Payer: Medicare Other

## 2019-08-16 ENCOUNTER — Ambulatory Visit: Payer: Medicare Other | Admitting: Nurse Practitioner

## 2019-08-20 ENCOUNTER — Other Ambulatory Visit: Payer: Self-pay

## 2019-08-20 ENCOUNTER — Ambulatory Visit (INDEPENDENT_AMBULATORY_CARE_PROVIDER_SITE_OTHER): Payer: Medicare Other | Admitting: Nurse Practitioner

## 2019-08-20 VITALS — BP 130/65 | HR 70 | Temp 98.1°F | Resp 16 | Ht 74.0 in | Wt 182.0 lb

## 2019-08-20 DIAGNOSIS — I10 Essential (primary) hypertension: Secondary | ICD-10-CM

## 2019-08-20 DIAGNOSIS — F17200 Nicotine dependence, unspecified, uncomplicated: Secondary | ICD-10-CM

## 2019-08-20 DIAGNOSIS — K2100 Gastro-esophageal reflux disease with esophagitis, without bleeding: Secondary | ICD-10-CM

## 2019-08-20 LAB — POCT URINALYSIS DIPSTICK
Bilirubin, UA: NEGATIVE
Glucose, UA: NEGATIVE
Ketones, UA: NEGATIVE
Leukocytes, UA: NEGATIVE
Nitrite, UA: NEGATIVE
Protein, UA: NEGATIVE
Spec Grav, UA: 1.02 (ref 1.010–1.025)
Urobilinogen, UA: 0.2 E.U./dL
pH, UA: 5.5 (ref 5.0–8.0)

## 2019-08-20 MED ORDER — OMEPRAZOLE 20 MG PO CPDR: 20.0000 mg | DELAYED_RELEASE_CAPSULE | Freq: Every day | ORAL | 3 refills | Status: DC

## 2019-08-20 NOTE — Progress Notes (Signed)
Steven Lowery, [place]  [address] Phone:  [phone]   Fax:  [phone]   Established Patient Office Visit  Subjective:  Patient ID: Steven Lowery, male    DOB: 10/09/1945  Age: 74 y.o. MRN: [id number]  CC:  Chief Complaint  Patient presents with  . Follow-up  . Hypertension    HPI [name] presents for follow up.  has a past medical history of Acute asthmatic bronchitis, Alcohol abuse, Back pain, Benign prostatic hypertrophy, Cigarette smoker, Coronary artery disease, DJD (degenerative joint disease), Hypertension, IBS (irritable bowel syndrome), Myocardial infarction [facility]), Neck pain, and Psychiatric disorder.   He states that he backslide. He states that he smoked 3 cigarettes a few days ago. He admits that he had gotten stressed. He was not wearing his patch. He has restarted using the patch.    He has completed his [place] vaccination.    He was seen by cardiology. His Brilinta was discontinued and he was started on Plavix. Increase in Losartan 50mg  qd. He admits that this has made him feel better already.   Hypertension Patient is here for follow-up of elevated blood pressure. He is exercising and is not adherent to a low-salt diet. Blood pressure is well controlled at home. Cardiac symptoms: chest pressure/discomfort and exertional chest pressure/discomfort. Patient denies claudication, fatigue, irregular heart beat, lower extremity edema, palpitations and syncope. Cardiovascular risk factors: advanced age (older than 18 for men, 79 for women), dyslipidemia, male gender and smoking/ tobacco exposure. Use of agents associated with hypertension: steroids. History of target organ damage: angina/ prior myocardial infarction.   GERD Paitent complains of heartburn. This has been associated with chest pain. He ate a hotdog last night filled with onions why lying in the bed. He denies choking on food, cough, deep pressure at base of  neck, difficulty swallowing, dysphagia, nausea and wheezing. Symptoms have been present for 1 day. He denies dysphagia. He has not lost weight. He denies melena, hematochezia, hematemesis, and coffee ground emesis. Medical therapy in the past has included proton pump inhibitors. He has a history of GERD however is no longer taking his medication; Omeprazole.   Past Medical History:  Diagnosis Date  . Acute asthmatic bronchitis   . Alcohol abuse   . Back pain   . Benign prostatic hypertrophy   . Cigarette smoker   . Coronary artery disease   . DJD (degenerative joint disease)   . Hypertension   . IBS (irritable bowel syndrome)   . Myocardial infarction [facility])    per pt he thinks he had an MI "5 years ago"  . Neck pain   . Psychiatric disorder     Past Surgical History:  Procedure Laterality Date  . CARDIAC CATHETERIZATION    . CORONARY STENT INTERVENTION N/A 04/12/2019   Procedure: CORONARY STENT INTERVENTION;  Surgeon: Lorretta Harp, MD;  Location: [place] [facility];  Service: Cardiovascular;  Laterality: N/A;  . CYSTOSCOPY  1999   Dr. Janice Norrie  . HAND SURGERY    . LEFT HEART CATH AND CORONARY ANGIOGRAPHY N/A 04/12/2019   Procedure: LEFT HEART CATH AND CORONARY ANGIOGRAPHY;  Surgeon: Lorretta Harp, MD;  Location: [place] [facility];  Service: Cardiovascular;  Laterality: N/A;  . TONSILLECTOMY      Family History  Problem Relation Age of Onset  . Breast cancer Sister   . Heart disease Sister   . Heart attack Father   . Heart disease  Brother   . Colon cancer Neg Hx   . Esophageal cancer Neg Hx   . Rectal cancer Neg Hx   . Stomach cancer Neg Hx     Social History   Socioeconomic History  . Marital status: Married    Spouse name: Not on file  . Number of children: Not on file  . Years of education: 60  . Highest education level: 11th grade  Occupational History  . Occupation: Retired  Tobacco Use  . Smoking status: Former Smoker    Packs/day: 0.30    Years:  40.00    Pack years: 12.00    Types: Cigarettes    Quit date: 05/24/2019    Years since quitting: 0.2  . Smokeless tobacco: Never Used  . Tobacco comment: has cut back alot per pt  Substance and Sexual Activity  . Alcohol use: Yes    Alcohol/week: 6.0 standard drinks    Types: 6 Shots of liquor per week    Comment: quit 3 months ago  . Drug use: No  . Sexual activity: Yes  Other Topics Concern  . Not on file  Social History Narrative  . Not on file   Social Determinants of Health   Financial Resource Strain:   . Difficulty of Paying Living Expenses:   Food Insecurity:   . Worried About Charity fundraiser in the Last Year:   . Arboriculturist in the Last Year:   Transportation Needs:   . Film/video editor (Medical):   [name] Lack of Transportation (Non-Medical):   Physical Activity:   . Days of Exercise per Week:   . Minutes of Exercise per Session:   Stress:   . Feeling of Stress :   Social Connections:   . Frequency of Communication with Friends and Family:   . Frequency of Social Gatherings with Friends and Family:   . Attends Religious Services:   . Active Member of Clubs or Organizations:   . Attends Archivist Meetings:   [name] Marital Status:   Intimate Partner Violence:   . Fear of Current or Ex-Partner:   . Emotionally Abused:   [name] Physically Abused:   . Sexually Abused:     Outpatient Medications Prior to Visit  Medication Sig Dispense Refill  . albuterol (VENTOLIN HFA) 108 (90 Base) MCG/ACT inhaler Inhale 1-2 puffs into the lungs every 6 (six) hours as needed for wheezing or shortness of breath. 1 g 3  . Ascorbic Acid (VITAMIN C PO) Take 1 tablet by mouth daily.    [name] aspirin EC 81 MG tablet Take 1 tablet (81 mg total) by mouth daily. 90 tablet 3  . atorvastatin (LIPITOR) 40 MG tablet Take 1 tablet (40 mg total) by mouth daily at 6 PM. 90 tablet 3  . clopidogrel (PLAVIX) 75 MG tablet Take 1 tablet (75 mg total) by mouth daily. 90 tablet 3  .  docusate sodium (COLACE) 100 MG capsule Take 100 mg by mouth daily.    . ferrous sulfate 325 (65 FE) MG tablet Take 325 mg by mouth daily with breakfast.    . fish oil-omega-3 fatty acids 1000 MG capsule Take 1 g by mouth daily.     [name] FLUTICASONE PROPIONATE, NASAL, NA Place into the nose.    . lidocaine (XYLOCAINE) 5 % ointment Apply 1 application topically 4 (four) times daily as needed. 35.44 g 0  . losartan (COZAAR) 50 MG tablet Take 1 tablet (50 mg total) by mouth  daily. 90 tablet 3  . Multiple Vitamin (MULTIVITAMIN WITH MINERALS) TABS tablet Take 1 tablet by mouth daily.    . nitroGLYCERIN (NITROSTAT) 0.4 MG SL tablet Place 1 tablet (0.4 mg total) under the tongue every 5 (five) minutes as needed for chest pain. 15 tablet 25  . OVER THE COUNTER MEDICATION Place 1 drop into both eyes daily as needed (Eye burning). Reported on 09/11/2015    . omeprazole (PRILOSEC) 20 MG capsule Take 20 mg by mouth daily.     Facility-Administered Medications Prior to Visit  Medication Dose Route Frequency Provider Last Rate Last Admin  . 0.9 %  sodium chloride infusion  500 mL Intravenous Continuous Ladene Artist, MD        Allergies  Allergen Reactions  . Lisinopril Cough    ROS Review of Systems  All other systems reviewed and are negative.     Objective:    Physical Exam  Constitutional: He is oriented to person, place, and time. He appears well-developed and well-nourished.  HENT:  Head: Normocephalic.  Eyes: Pupils are equal, round, and reactive to light.  Cardiovascular: Normal rate, regular rhythm, normal heart sounds and intact distal pulses.  Pulmonary/Chest: Effort normal and breath sounds normal.  Abdominal: Soft.  Musculoskeletal:        General: Normal range of motion.     Cervical back: Normal range of motion.     Comments: Ambulating with a cane  Neurological: He is alert and oriented to person, place, and time.  Skin: Skin is warm and dry.  Psychiatric: He has a normal  mood and affect. His behavior is normal. Judgment and thought content normal.    BP 130/65 (BP Location: Left Arm, Patient Position: Sitting, Cuff Size: Large)   Pulse 70   Temp 98.1 F (36.7 C) (Oral)   Resp 16   Ht 6\' 2"  (1.88 m)   Wt 182 lb (82.6 kg)   SpO2 100%   BMI 23.37 kg/m  Wt Readings from Last 3 Encounters:  08/20/19 182 lb (82.6 kg)  07/26/19 178 lb 6.4 oz (80.9 kg)  07/07/19 184 lb (83.5 kg)     There are no preventive care reminders to display for this patient.  There are no preventive care reminders to display for this patient.  Lab Results  Component Value Date   TSH 0.946 04/17/2015   Lab Results  Component Value Date   WBC 4.8 06/22/2019   HGB 14.1 06/22/2019   HCT 41.9 06/22/2019   MCV 92.5 06/22/2019   PLT 258 06/22/2019   Lab Results  Component Value Date   NA 139 06/22/2019   K 4.1 06/22/2019   CO2 24 06/22/2019   GLUCOSE 98 06/22/2019   BUN 10 06/22/2019   CREATININE 1.15 06/22/2019   BILITOT 0.3 02/02/2018   ALKPHOS 86 02/02/2018   AST 24 02/02/2018   ALT 20 02/02/2018   PROT 6.3 02/02/2018   ALBUMIN 4.1 02/02/2018   CALCIUM 9.2 06/22/2019   ANIONGAP 9 06/22/2019   GFR 84.97 10/27/2012   Lab Results  Component Value Date   CHOL 149 04/11/2019   Lab Results  Component Value Date   HDL 73 04/11/2019   Lab Results  Component Value Date   LDLCALC 69 04/11/2019   Lab Results  Component Value Date   TRIG 37 04/11/2019   Lab Results  Component Value Date   CHOLHDL 2.0 04/11/2019   Lab Results  Component Value Date   HGBA1C 5.9 (H)  04/11/2019      Assessment & Plan:   Problem List Items Addressed This Visit      High   Essential hypertension - Primary   Relevant Orders   Urinalysis Dipstick (Completed)   Lipid panel   Comp. Metabolic Panel (12)     Medium   Tobacco dependence    Other Visit Diagnoses    Gastroesophageal reflux disease with esophagitis without hemorrhage       Omperazole refilled        Meds ordered this encounter  Medications  . omeprazole (PRILOSEC) 20 MG capsule    Sig: Take 1 capsule (20 mg total) by mouth daily.    Dispense:  90 capsule    Refill:  3    Order Specific Question:   Supervising Provider    Answer:   Tresa Garter [id number]    Follow-up: Return in about 3 months (around 11/19/2019).    Vevelyn Francois, NP

## 2019-08-20 NOTE — Patient Instructions (Signed)

## 2019-08-21 LAB — COMP. METABOLIC PANEL (12)
AST: 18 IU/L (ref 0–40)
Albumin/Globulin Ratio: 1.5 (ref 1.2–2.2)
Albumin: 4.3 g/dL (ref 3.7–4.7)
Alkaline Phosphatase: 101 IU/L (ref 39–117)
BUN/Creatinine Ratio: 13 (ref 10–24)
BUN: 17 mg/dL (ref 8–27)
Bilirubin Total: 0.3 mg/dL (ref 0.0–1.2)
Calcium: 9.5 mg/dL (ref 8.6–10.2)
Chloride: 104 mmol/L (ref 96–106)
Creatinine, Ser: 1.28 mg/dL — ABNORMAL HIGH (ref 0.76–1.27)
GFR calc Af Amer: 64 mL/min/{1.73_m2} (ref >59)
GFR calc non Af Amer: 55 mL/min/{1.73_m2} — ABNORMAL LOW (ref >59)
Globulin, Total: 2.8 g/dL (ref 1.5–4.5)
Glucose: 96 mg/dL (ref 65–99)
Potassium: 4.3 mmol/L (ref 3.5–5.2)
Sodium: 140 mmol/L (ref 134–144)
Total Protein: 7.1 g/dL (ref 6.0–8.5)

## 2019-08-21 LAB — LIPID PANEL
Chol/HDL Ratio: 2.1 ratio (ref 0.0–5.0)
Cholesterol, Total: 160 mg/dL (ref 100–199)
HDL: 76 mg/dL (ref >39)
LDL Chol Calc (NIH): 72 mg/dL (ref 0–99)
Triglycerides: 57 mg/dL (ref 0–149)
VLDL Cholesterol Cal: 12 mg/dL (ref 5–40)

## 2019-09-07 ENCOUNTER — Emergency Department (HOSPITAL_COMMUNITY)
Admission: EM | Admit: 2019-09-07 | Discharge: 2019-09-07 | Disposition: A | Discharge status: Home / Self Care | Payer: Medicare Other | Attending: Emergency Medicine | Admitting: Emergency Medicine

## 2019-09-07 ENCOUNTER — Encounter (HOSPITAL_COMMUNITY): Payer: Self-pay | Admitting: Emergency Medicine

## 2019-09-07 DIAGNOSIS — M542 Cervicalgia: Secondary | ICD-10-CM | POA: Diagnosis not present

## 2019-09-07 DIAGNOSIS — R0789 Other chest pain: Secondary | ICD-10-CM | POA: Insufficient documentation

## 2019-09-07 DIAGNOSIS — Z87891 Personal history of nicotine dependence: Secondary | ICD-10-CM | POA: Insufficient documentation

## 2019-09-07 DIAGNOSIS — Z79899 Other long term (current) drug therapy: Secondary | ICD-10-CM | POA: Insufficient documentation

## 2019-09-07 DIAGNOSIS — I10 Essential (primary) hypertension: Secondary | ICD-10-CM | POA: Diagnosis not present

## 2019-09-07 DIAGNOSIS — J449 Chronic obstructive pulmonary disease, unspecified: Secondary | ICD-10-CM | POA: Diagnosis not present

## 2019-09-07 DIAGNOSIS — Z7982 Long term (current) use of aspirin: Secondary | ICD-10-CM | POA: Diagnosis not present

## 2019-09-07 DIAGNOSIS — I252 Old myocardial infarction: Secondary | ICD-10-CM | POA: Insufficient documentation

## 2019-09-07 MED ORDER — BACLOFEN 10 MG PO TABS
10.0000 mg | ORAL_TABLET | Freq: Three times a day (TID) | ORAL | 0 refills | Status: DC
Start: 2019-09-07 — End: 2019-09-22

## 2019-09-07 MED ORDER — ACETAMINOPHEN 325 MG PO TABS
650.0000 mg | ORAL_TABLET | Freq: Once | ORAL | Status: AC
  Administered 2019-09-07: 16:00:00 650 mg via ORAL
  Filled 2019-09-07: qty 2

## 2019-09-07 NOTE — Discharge Instructions (Signed)
Wear the cervical collar for comfort. Take medication as directed. Please refer to the attached instructions for return precautions. Follow up with your primary care provider as needed.
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
## 2019-09-11 ENCOUNTER — Other Ambulatory Visit: Payer: Self-pay | Admitting: Nurse Practitioner

## 2019-09-13 NOTE — Telephone Encounter (Signed)
Refill request for Sildenafil 20 MG

## 2019-09-22 ENCOUNTER — Encounter: Payer: Self-pay | Admitting: Nurse Practitioner

## 2019-09-22 ENCOUNTER — Ambulatory Visit (INDEPENDENT_AMBULATORY_CARE_PROVIDER_SITE_OTHER): Payer: Medicare Other | Admitting: Nurse Practitioner

## 2019-09-22 ENCOUNTER — Other Ambulatory Visit: Payer: Self-pay

## 2019-09-22 VITALS — BP 172/86 | HR 72 | Ht 74.0 in | Wt 187.8 lb

## 2019-09-22 DIAGNOSIS — S139XXA Sprain of joints and ligaments of unspecified parts of neck, initial encounter: Secondary | ICD-10-CM

## 2019-09-22 DIAGNOSIS — M62838 Other muscle spasm: Secondary | ICD-10-CM

## 2019-09-22 DIAGNOSIS — S139XXS Sprain of joints and ligaments of unspecified parts of neck, sequela: Secondary | ICD-10-CM

## 2019-09-22 MED ORDER — BACLOFEN 10 MG PO TABS: 10.0000 mg | ORAL_TABLET | Freq: Three times a day (TID) | ORAL | 0 refills | Status: AC

## 2019-09-22 NOTE — Patient Instructions (Signed)
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
## 2019-09-29 ENCOUNTER — Other Ambulatory Visit: Payer: Self-pay | Admitting: Nurse Practitioner

## 2019-09-29 ENCOUNTER — Telehealth: Payer: Self-pay | Admitting: Nurse Practitioner

## 2019-09-29 DIAGNOSIS — S139XXS Sprain of joints and ligaments of unspecified parts of neck, sequela: Secondary | ICD-10-CM

## 2019-09-29 DIAGNOSIS — M542 Cervicalgia: Secondary | ICD-10-CM

## 2019-09-29 MED ORDER — ACETAMINOPHEN-CODEINE 300-30 MG PO TABS: 1.0000 | ORAL_TABLET | Freq: Four times a day (QID) | ORAL | 0 refills | Status: AC | PRN

## 2019-09-29 NOTE — Telephone Encounter (Signed)
Pt states muscle relaxer's are not working. Pt wants to see if there are other options, like pain meds or something.

## 2019-09-29 NOTE — Telephone Encounter (Signed)
Patient advised of Crystal's comment. / Patient  / Patient is not doing any physical therapy, has an appointment with Ortho on 10/01/2018 / Patient would like the Tylenol #3 sent to [facility] on [address] / Patient verbalizes understanding.
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
## 2019-09-29 NOTE — Telephone Encounter (Signed)
Pt wants an update on his referral to a therapist

## 2019-10-01 ENCOUNTER — Ambulatory Visit: Payer: Medicare Other | Admitting: Family Medicine

## 2019-10-01 ENCOUNTER — Other Ambulatory Visit: Payer: Self-pay | Admitting: Nurse Practitioner

## 2019-10-01 NOTE — Telephone Encounter (Signed)
Refill Sildenafil

## 2019-10-06 ENCOUNTER — Telehealth: Payer: Self-pay | Admitting: Nurse Practitioner

## 2019-10-06 ENCOUNTER — Other Ambulatory Visit: Payer: Self-pay | Admitting: Nurse Practitioner

## 2019-10-06 NOTE — Telephone Encounter (Signed)
Called and l/m with patient sister for him to call back regarding his appt.

## 2019-10-06 NOTE — Telephone Encounter (Signed)
Called  message w/ sister for patient to call us back.

## 2019-10-06 NOTE — Telephone Encounter (Signed)
Refill request for Tylenol #3
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
## 2019-10-06 NOTE — Telephone Encounter (Signed)
Pt needs a medication refill on tylenol 3

## 2019-10-07 NOTE — Telephone Encounter (Signed)
Spoke with patient  gave him the information to call Ortho care to r/s his appt that he missed.

## 2019-10-07 NOTE — Telephone Encounter (Signed)
Spoke with gave him the referral information

## 2019-10-13 ENCOUNTER — Encounter: Payer: Self-pay | Admitting: Family Medicine

## 2019-10-13 ENCOUNTER — Ambulatory Visit: Payer: Self-pay

## 2019-10-13 ENCOUNTER — Other Ambulatory Visit: Payer: Self-pay

## 2019-10-13 ENCOUNTER — Ambulatory Visit (INDEPENDENT_AMBULATORY_CARE_PROVIDER_SITE_OTHER): Payer: Medicare Other | Admitting: Family Medicine

## 2019-10-13 DIAGNOSIS — M542 Cervicalgia: Secondary | ICD-10-CM

## 2019-10-13 DIAGNOSIS — M5030 Other cervical disc degeneration, unspecified cervical region: Secondary | ICD-10-CM | POA: Diagnosis not present

## 2019-10-13 MED ORDER — ACETAMINOPHEN-CODEINE #3 300-30 MG PO TABS: 1.0000 | ORAL_TABLET | Freq: Four times a day (QID) | ORAL | 0 refills | Status: DC | PRN
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
## 2019-10-18 ENCOUNTER — Encounter: Payer: Self-pay | Admitting: Cardiology

## 2019-10-18 ENCOUNTER — Ambulatory Visit (INDEPENDENT_AMBULATORY_CARE_PROVIDER_SITE_OTHER): Payer: Medicare Other | Admitting: Cardiology

## 2019-10-18 ENCOUNTER — Other Ambulatory Visit: Payer: Self-pay

## 2019-10-18 VITALS — BP 160/88 | HR 74 | Ht 74.0 in | Wt 184.0 lb

## 2019-10-18 DIAGNOSIS — Z720 Tobacco use: Secondary | ICD-10-CM

## 2019-10-18 DIAGNOSIS — I2510 Atherosclerotic heart disease of native coronary artery without angina pectoris: Secondary | ICD-10-CM | POA: Diagnosis not present

## 2019-10-18 DIAGNOSIS — I10 Essential (primary) hypertension: Secondary | ICD-10-CM

## 2019-10-18 MED ORDER — LOSARTAN POTASSIUM 100 MG PO TABS
100.0000 mg | ORAL_TABLET | Freq: Every day | ORAL | 3 refills | Status: AC
Start: 2019-10-18 — End: 2020-10-12

## 2019-10-18 NOTE — Patient Instructions (Signed)
Increase losartan to 100 mg daily  Follow up in 6 months  Congratulations on quitting smoking.

## 2019-11-02 ENCOUNTER — Other Ambulatory Visit: Payer: Self-pay

## 2019-11-02 ENCOUNTER — Encounter: Payer: Self-pay | Admitting: Physical Therapy

## 2019-11-02 ENCOUNTER — Ambulatory Visit: Payer: Medicare Other | Attending: Nurse Practitioner | Admitting: Physical Therapy

## 2019-11-02 DIAGNOSIS — M542 Cervicalgia: Secondary | ICD-10-CM | POA: Diagnosis not present

## 2019-11-02 DIAGNOSIS — R293 Abnormal posture: Secondary | ICD-10-CM | POA: Diagnosis present

## 2019-11-02 DIAGNOSIS — R252 Cramp and spasm: Secondary | ICD-10-CM | POA: Diagnosis present

## 2019-11-02 NOTE — Patient Instructions (Signed)
Access Code: [email or web address] Prepared by: Carolyne Littles  Exercises .Seated Upper Trapezius Stretch - 1 x daily - 7 x weekly - 1 sets - 3 reps - 20 hold .Standing Upper Trapezius Mobilization with Small Ball - 1 x daily - 7 x weekly - 1 sets - 3 reps - 20 hold
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
## 2019-11-03 ENCOUNTER — Encounter: Payer: Self-pay | Admitting: Physical Therapy

## 2019-11-10 ENCOUNTER — Ambulatory Visit (INDEPENDENT_AMBULATORY_CARE_PROVIDER_SITE_OTHER): Payer: Medicare Other | Admitting: Family Medicine

## 2019-11-10 ENCOUNTER — Encounter: Payer: Self-pay | Admitting: Family Medicine

## 2019-11-10 ENCOUNTER — Other Ambulatory Visit: Payer: Self-pay

## 2019-11-10 DIAGNOSIS — M5030 Other cervical disc degeneration, unspecified cervical region: Secondary | ICD-10-CM | POA: Diagnosis not present

## 2019-11-10 DIAGNOSIS — M542 Cervicalgia: Secondary | ICD-10-CM | POA: Diagnosis not present

## 2019-11-10 MED ORDER — ACETAMINOPHEN-CODEINE #3 300-30 MG PO TABS: 1.0000 | ORAL_TABLET | Freq: Four times a day (QID) | ORAL | 0 refills | Status: DC | PRN
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
## 2019-11-11 ENCOUNTER — Ambulatory Visit: Payer: Medicare Other | Attending: Nurse Practitioner

## 2019-11-11 DIAGNOSIS — M542 Cervicalgia: Secondary | ICD-10-CM | POA: Diagnosis present

## 2019-11-11 DIAGNOSIS — R293 Abnormal posture: Secondary | ICD-10-CM | POA: Insufficient documentation

## 2019-11-11 DIAGNOSIS — R252 Cramp and spasm: Secondary | ICD-10-CM | POA: Insufficient documentation

## 2019-11-11 NOTE — Patient Instructions (Signed)
Self care with use of 2 balls for STW, use of towel roll to support neck and pillow positions to support neck for sleep/

## 2019-11-11 NOTE — Therapy (Signed)
[place], [place], [address] Phone: [phone]   Fax:  [phone]  Physical Therapy Treatment  Patient Details  Name: Steven Lowery MRN: [id number] Date of Birth: 1945-12-27 Referring Provider (PT): Dr Eunice Blase    Encounter Date: 11/11/2019   PT End of Session - 11/11/19 1041    Visit Number 2    Number of Visits 7    Date for PT Re-Evaluation 12/22/19    Authorization Type Medicare/ [employer]    PT Start Time 1000    PT Stop Time 1050    PT Time Calculation (min) 50 min    Activity Tolerance Patient tolerated treatment well;No increased pain    Behavior During Therapy WFL for tasks assessed/performed           Past Medical History:  Diagnosis Date   Acute asthmatic bronchitis    Alcohol abuse    Back pain    Benign prostatic hypertrophy    Cigarette smoker    Coronary artery disease    DJD (degenerative joint disease)    Hypertension    IBS (irritable bowel syndrome)    Myocardial infarction ([place])    per pt he thinks he had an MI "5 years ago"   Neck pain    Psychiatric disorder     Past Surgical History:  Procedure Laterality Date   CARDIAC CATHETERIZATION     CORONARY STENT INTERVENTION N/A 04/12/2019   Procedure: CORONARY STENT INTERVENTION;  Surgeon: Lorretta Harp, MD;  Location: [facility];  Service: Cardiovascular;  Laterality: N/A;   CYSTOSCOPY  1999   Dr. Janice Norrie   HAND SURGERY     LEFT HEART CATH AND CORONARY ANGIOGRAPHY N/A 04/12/2019   Procedure: LEFT HEART CATH AND CORONARY ANGIOGRAPHY;  Surgeon: Lorretta Harp, MD;  Location: [facility];  Service: Cardiovascular;  Laterality: N/A;   TONSILLECTOMY      There were no vitals filed for this visit.   Subjective Assessment - 11/11/19 1005    Subjective No better . MD  said to keep doing what I need to do    Pertinent History Backache, MI, osteoarthritis, alcohol abuse, conory  artery disease, psychiatric disorder    Diagnostic tests X-ray cervical spine reveal diffuse moderate to severe degenerative disc disease and facet DJD at C3-4, C4-5, C5-6 and C6-7.  No sign of acute fracture.    Patient Stated Goals pain to go away    Currently in Pain? Yes    Pain Score 9     Pain Location Neck    Pain Orientation Right;Left    Pain Descriptors / Indicators Burning;Aching    Pain Type Chronic pain    Pain Onset More than a month ago    Pain Frequency Constant    Aggravating Factors  moving head    Pain Relieving Factors meds                             [facility] Adult PT Treatment/Exercise - 11/11/19 0001      Neck Exercises: Machines for Strengthening   UBE (Upper Arm Bike) L1  4 min      Neck Exercises: Seated   Cervical Rotation Right;Left;5 reps    Lateral Flexion 5 reps    Lateral Flexion Limitations 20 sec      Modalities   Modalities Moist Heat;Ultrasound      Moist Heat Therapy  Number Minutes Moist Heat 10 Minutes    Moist Heat Location Cervical      Ultrasound   Ultrasound Location neck    Ultrasound Parameters aoo% 1.6 Wcm2, 1 MHz    Ultrasound Goals Pain      Manual Therapy   Manual Therapy Soft tissue mobilization;Joint mobilization    Joint Mobilization PA and lateral glides to neck and uppr thoracic spine    Soft tissue mobilization paraspinasl and traps bilaterally                  PT Education - 11/11/19 1041    Education Details self care    Person(s) Educated Patient    Methods Explanation    Comprehension Verbalized understanding            PT Short Term Goals - 11/02/19 1319      PT SHORT TERM GOAL #1   Title Patient will demonstrate I with HEP    Time 3    Period Weeks    Status New    Target Date 11/23/19      PT SHORT TERM GOAL #2   Title Patient will improve cervical rotation >/= 10 degrees to improve functional mobility of neck.    Time 6    Period Weeks    Status New      PT  SHORT TERM GOAL #3   Title Patient will improve UE strength to 5/5 in all directions bilaterally.    Time 3    Period Weeks    Status New    Target Date 11/23/19             PT Long Term Goals - 11/02/19 1323      PT LONG TERM GOAL #1   Title Patient will increase cervical rotation to WNL without pain to improve mobility and decrease pain.    Time 6    Period Weeks    Status New    Target Date 12/14/19      PT LONG TERM GOAL #2   Title FOTO score will improve to    Time 6    Period Weeks    Status New    Target Date 12/14/19      PT LONG TERM GOAL #3   Title Patient will report pain </=2/10 with all cervical motion in order to perfrom IADLs.    Time 6    Status New    Target Date 12/14/19                 Plan - 11/11/19 1012    Clinical Impression Statement Mr Flinn reported he felt better post session. worked on sleeping postures for better sleep. Will continue with manual and modalities and stretching , Possibel trial of traction    PT Treatment/Interventions Moist Heat;Electrical Stimulation;Traction;Therapeutic activities;Functional mobility training;Therapeutic exercise;Manual techniques;Dry needling;ADLs/Self Care Home Management;Patient/family education;Taping;Passive range of motion;Cryotherapy    PT Next Visit Plan STW for upper traps, continue cervical stretches, posture, shoulder strenghtening;  possible traction    PT Home Exercise Plan upper trapezius stretch, self trigger point release with tennis ball    Consulted and Agree with Plan of Care Patient           Patient will benefit from skilled therapeutic intervention in order to improve the following deficits and impairments:  Improper body mechanics, Decreased range of motion, Decreased activity tolerance, Decreased strength, Pain  Visit Diagnosis: Cervicalgia  Abnormal posture  Cramp and spasm  Problem List Patient Active Problem List   Diagnosis Date Noted   NSTEMI (non-ST  elevated myocardial infarction) ([place])    Precordial chest pain 04/09/2019   Chest pain, precordial    BPH (benign prostatic hyperplasia) 02/08/2015   Decreased urine stream 02/08/2015   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 [place], [place], [address] Phone: [phone]   Fax:  [phone]  Name: SEUNG NIDIFFER MRN: [id number] Date of Birth: [birth year]

## 2019-11-15 ENCOUNTER — Other Ambulatory Visit: Payer: Self-pay

## 2019-11-16 ENCOUNTER — Other Ambulatory Visit: Payer: Self-pay

## 2019-11-16 ENCOUNTER — Ambulatory Visit: Payer: Medicare Other

## 2019-11-16 DIAGNOSIS — M542 Cervicalgia: Secondary | ICD-10-CM

## 2019-11-16 DIAGNOSIS — R252 Cramp and spasm: Secondary | ICD-10-CM

## 2019-11-16 DIAGNOSIS — R293 Abnormal posture: Secondary | ICD-10-CM

## 2019-11-16 NOTE — Therapy (Signed)
[place], [place], [address] Phone: [phone]   Fax:  [phone]  Physical Therapy Treatment  Patient Details  Name: Steven Lowery MRN: [id number] Date of Birth: 09-10-1945 Referring Provider (PT): Dr Eunice Blase    Encounter Date: 11/16/2019   PT End of Session - 11/16/19 1130    Visit Number 3    Number of Visits 7    Date for PT Re-Evaluation 12/22/19    Authorization Type Medicare/ [employer]    PT Start Time 1000    PT Stop Time 1050    PT Time Calculation (min) 50 min    Activity Tolerance Patient tolerated treatment well;No increased pain    Behavior During Therapy WFL for tasks assessed/performed           Past Medical History:  Diagnosis Date   Acute asthmatic bronchitis    Alcohol abuse    Back pain    Benign prostatic hypertrophy    Cigarette smoker    Coronary artery disease    DJD (degenerative joint disease)    Hypertension    IBS (irritable bowel syndrome)    Myocardial infarction ([place])    per pt he thinks he had an MI "5 years ago"   Neck pain    Psychiatric disorder     Past Surgical History:  Procedure Laterality Date   CARDIAC CATHETERIZATION     CORONARY STENT INTERVENTION N/A 04/12/2019   Procedure: CORONARY STENT INTERVENTION;  Surgeon: Lorretta Harp, MD;  Location: [facility];  Service: Cardiovascular;  Laterality: N/A;   CYSTOSCOPY  1999   Dr. Janice Norrie   HAND SURGERY     LEFT HEART CATH AND CORONARY ANGIOGRAPHY N/A 04/12/2019   Procedure: LEFT HEART CATH AND CORONARY ANGIOGRAPHY;  Surgeon: Lorretta Harp, MD;  Location: [facility];  Service: Cardiovascular;  Laterality: N/A;   TONSILLECTOMY      There were no vitals filed for this visit.   Subjective Assessment - 11/16/19 1053    Subjective Doing some better Still sore and stiff on RT side.    Pain Score 6     Pain Location Neck    Pain Orientation Right;Left     Pain Descriptors / Indicators Burning    Pain Type Chronic pain    Pain Onset More than a month ago    Pain Frequency Constant                             [facility] Adult PT Treatment/Exercise - 11/16/19 0001      Neck Exercises: Machines for Strengthening   UBE (Upper Arm Bike)  7 min  L1      Neck Exercises: Theraband   Shoulder Extension 15 reps    Shoulder Extension Limitations yellow    Rows 15 reps    Rows Limitations yellow    Horizontal ABduction 15 reps    Horizontal ABduction Limitations yellow      Moist Heat Therapy   Number Minutes Moist Heat 10 Minutes    Moist Heat Location Cervical      Ultrasound   Ultrasound Location neck    Ultrasound Parameters 100% ! MHz 1.6 Wcm2    Ultrasound Goals Pain      Manual Therapy   Joint Mobilization PA and lateral glides to neck and uppr thoracic spine    Soft tissue mobilization paraspinasl and traps bilaterally  PT Education - 11/16/19 1129    Education Details HEP cued to not elevate shoulders    Person(s) Educated Patient    Methods Explanation;Demonstration;Verbal cues;Handout;Tactile cues    Comprehension Verbalized understanding;Returned demonstration            PT Short Term Goals - 11/02/19 1319      PT SHORT TERM GOAL #1   Title Patient will demonstrate I with HEP    Time 3    Period Weeks    Status New    Target Date 11/23/19      PT SHORT TERM GOAL #2   Title Patient will improve cervical rotation >/= 10 degrees to improve functional mobility of neck.    Time 6    Period Weeks    Status New      PT SHORT TERM GOAL #3   Title Patient will improve UE strength to 5/5 in all directions bilaterally.    Time 3    Period Weeks    Status New    Target Date 11/23/19             PT Long Term Goals - 11/02/19 1323      PT LONG TERM GOAL #1   Title Patient will increase cervical rotation to WNL without pain to improve mobility and decrease pain.    Time 6     Period Weeks    Status New    Target Date 12/14/19      PT LONG TERM GOAL #2   Title FOTO score will improve to    Time 6    Period Weeks    Status New    Target Date 12/14/19      PT LONG TERM GOAL #3   Title Patient will report pain </=2/10 with all cervical motion in order to perfrom IADLs.    Time 6    Status New    Target Date 12/14/19                 Plan - 11/16/19 1130    Clinical Impression Statement Improving with decreased pain. tolerates resisted exercises,    PT Treatment/Interventions Moist Heat;Electrical Stimulation;Traction;Therapeutic activities;Functional mobility training;Therapeutic exercise;Manual techniques;Dry needling;ADLs/Self Care Home Management;Patient/family education;Taping;Passive range of motion;Cryotherapy    PT Next Visit Plan STW for upper traps, continue cervical stretches, posture, shoulder strenghtening;  possible traction    PT Home Exercise Plan upper trapezius stretch, self trigger point release with tennis ball,  red band row/extension . hor abduction    Consulted and Agree with Plan of Care Patient           Patient will benefit from skilled therapeutic intervention in order to improve the following deficits and impairments:  Improper body mechanics, Decreased range of motion, Decreased activity tolerance, Decreased strength, Pain  Visit Diagnosis: Cervicalgia  Abnormal posture  Cramp and spasm     Problem List Patient Active Problem List   Diagnosis Date Noted   NSTEMI (non-ST elevated myocardial infarction) ([place])    Precordial chest pain 04/09/2019   Chest pain, precordial    BPH (benign prostatic hyperplasia) 02/08/2015   Decreased urine stream 02/08/2015   COPD with emphysema ([place]) 01/16/2011   Nonpsychotic mental disorder 06/28/2008   Tobacco abuse 06/28/2008   Essential hypertension 06/28/2008   Coronary atherosclerosis 06/28/2008   ASTHMATIC BRONCHITIS, ACUTE 06/28/2008   Osteoarthritis  06/28/2008   NECK PAIN 06/28/2008   Tobacco dependence 06/28/2008   Backache 12/24/2007   ALCOHOL ABUSE 05/12/2007  IRRITABLE BOWEL SYNDROME, HX OF 05/12/2007    Steven Lowery  PT 11/16/2019, 11:32 AM  [facility] [address] [place], [place], [address] Phone: [phone]   Fax:  [phone]  Name: Steven Lowery MRN: [id number] Date of Birth: [birth year]

## 2019-11-16 NOTE — Patient Instructions (Signed)
Band exercises row/extension/hor abduction red band  X 10-20 daily bilateral

## 2019-11-19 ENCOUNTER — Ambulatory Visit (INDEPENDENT_AMBULATORY_CARE_PROVIDER_SITE_OTHER): Payer: Medicare Other | Admitting: Nurse Practitioner

## 2019-11-19 ENCOUNTER — Other Ambulatory Visit: Payer: Self-pay

## 2019-11-19 VITALS — BP 141/76 | HR 64 | Temp 97.7°F | Ht 74.0 in | Wt 186.0 lb

## 2019-11-19 DIAGNOSIS — F1010 Alcohol abuse, uncomplicated: Secondary | ICD-10-CM | POA: Diagnosis not present

## 2019-11-19 DIAGNOSIS — F1190 Opioid use, unspecified, uncomplicated: Secondary | ICD-10-CM

## 2019-11-19 DIAGNOSIS — Z0000 Encounter for general adult medical examination without abnormal findings: Secondary | ICD-10-CM

## 2019-11-19 DIAGNOSIS — I10 Essential (primary) hypertension: Secondary | ICD-10-CM | POA: Diagnosis not present

## 2019-11-19 LAB — POCT URINALYSIS DIPSTICK
Bilirubin, UA: NEGATIVE
Blood, UA: NEGATIVE
Glucose, UA: NEGATIVE
Ketones, UA: NEGATIVE
Leukocytes, UA: NEGATIVE
Nitrite, UA: NEGATIVE
Protein, UA: NEGATIVE
Spec Grav, UA: 1.015 (ref 1.010–1.025)
Urobilinogen, UA: 0.2 E.U./dL
pH, UA: 6.5 (ref 5.0–8.0)
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
## 2019-11-20 LAB — COMP. METABOLIC PANEL (12)
AST: 17 IU/L (ref 0–40)
Albumin/Globulin Ratio: 1.6 (ref 1.2–2.2)
Albumin: 4.1 g/dL (ref 3.7–4.7)
Alkaline Phosphatase: 99 IU/L (ref 48–121)
BUN/Creatinine Ratio: 10 (ref 10–24)
BUN: 11 mg/dL (ref 8–27)
Bilirubin Total: 0.3 mg/dL (ref 0.0–1.2)
Calcium: 9.3 mg/dL (ref 8.6–10.2)
Chloride: 103 mmol/L (ref 96–106)
Creatinine, Ser: 1.1 mg/dL (ref 0.76–1.27)
GFR calc Af Amer: 76 mL/min/{1.73_m2} (ref >59)
GFR calc non Af Amer: 66 mL/min/{1.73_m2} (ref >59)
Globulin, Total: 2.5 g/dL (ref 1.5–4.5)
Glucose: 101 mg/dL — ABNORMAL HIGH (ref 65–99)
Potassium: 4.1 mmol/L (ref 3.5–5.2)
Sodium: 141 mmol/L (ref 134–144)
Total Protein: 6.6 g/dL (ref 6.0–8.5)

## 2019-11-21 ENCOUNTER — Encounter: Payer: Self-pay | Admitting: Nurse Practitioner

## 2019-11-23 ENCOUNTER — Ambulatory Visit: Payer: Medicare Other

## 2019-11-23 ENCOUNTER — Other Ambulatory Visit: Payer: Self-pay

## 2019-11-23 DIAGNOSIS — R293 Abnormal posture: Secondary | ICD-10-CM

## 2019-11-23 DIAGNOSIS — R252 Cramp and spasm: Secondary | ICD-10-CM

## 2019-11-23 DIAGNOSIS — M542 Cervicalgia: Secondary | ICD-10-CM | POA: Diagnosis not present

## 2019-11-23 NOTE — Therapy (Signed)
[place], [place], [address] Phone: [phone]   Fax:  [phone]  Physical Therapy Treatment  Patient Details  Name: Steven Lowery MRN: [id number] Date of Birth: 1945-05-23 Referring Provider (PT): Dr Eunice Blase    Encounter Date: 11/23/2019   PT End of Session - 11/23/19 0940    Visit Number 4    Number of Visits 7    Date for PT Re-Evaluation 12/22/19    Authorization Type Medicare/ Mediciad    PT Start Time 0945    PT Stop Time 8242    PT Time Calculation (min) 50 min    Activity Tolerance Patient tolerated treatment well;No increased pain    Behavior During Therapy WFL for tasks assessed/performed           Past Medical History:  Diagnosis Date  . Acute asthmatic bronchitis   . Alcohol abuse   . Back pain   . Benign prostatic hypertrophy   . Cigarette smoker   . Coronary artery disease   . DJD (degenerative joint disease)   . Hypertension   . IBS (irritable bowel syndrome)   . Myocardial infarction [facility])    per pt he thinks he had an MI "5 years ago"  . Neck pain   . Psychiatric disorder     Past Surgical History:  Procedure Laterality Date  . CARDIAC CATHETERIZATION    . CORONARY STENT INTERVENTION N/A 04/12/2019   Procedure: CORONARY STENT INTERVENTION;  Surgeon: Lorretta Harp, MD;  Location: [place] [facility];  Service: Cardiovascular;  Laterality: N/A;  . CYSTOSCOPY  1999   Dr. Janice Norrie  . HAND SURGERY    . LEFT HEART CATH AND CORONARY ANGIOGRAPHY N/A 04/12/2019   Procedure: LEFT HEART CATH AND CORONARY ANGIOGRAPHY;  Surgeon: Lorretta Harp, MD;  Location: [place] [facility];  Service: Cardiovascular;  Laterality: N/A;  . TONSILLECTOMY      There were no vitals filed for this visit.   Subjective Assessment - 11/23/19 0946    Subjective RT neck sore . Laid in bed yesterday.    Currently in Pain? Yes    Pain Score 6     Pain Location Neck    Pain Orientation  Right;Left    Pain Descriptors / Indicators Burning    Pain Type Chronic pain    Pain Onset More than a month ago    Pain Frequency Constant    Aggravating Factors  moving neck    Pain Relieving Factors meds , heat                             [facility] Adult PT Treatment/Exercise - 11/23/19 0001      Neck Exercises: Machines for Strengthening   UBE (Upper Arm Bike)  7 min  L1      Neck Exercises: Theraband   Shoulder Extension 15 reps    Shoulder Extension Limitations green    Rows 15 reps    Rows Limitations green    Horizontal ABduction 15 reps    Horizontal ABduction Limitations green      Modalities   Modalities Moist Heat;Traction      Moist Heat Therapy   Number Minutes Moist Heat 10 Minutes    Moist Heat Location Cervical      Traction   Type of Traction Cervical    Min (lbs) 5    Max (lbs) 12  Hold Time 60    Rest Time 15    Time 12      Manual Therapy   Joint Mobilization PA and lateral glides to neck and uppr thoracic spine    Soft tissue mobilization paraspinasl and traps bilaterally                    PT Short Term Goals - 11/23/19 1030      PT SHORT TERM GOAL #1   Title Patient will demonstrate I with HEP    Status Achieved      PT SHORT TERM GOAL #2   Title Patient will improve cervical rotation >/= 10 degrees to improve functional mobility of neck.    Status Unable to assess      PT SHORT TERM GOAL #3   Title Patient will improve UE strength to 5/5 in all directions bilaterally.    Status Unable to assess             PT Long Term Goals - 11/02/19 1323      PT LONG TERM GOAL #1   Title Patient will increase cervical rotation to WNL without pain to improve mobility and decrease pain.    Time 6    Period Weeks    Status New    Target Date 12/14/19      PT LONG TERM GOAL #2   Title FOTO score will improve to    Time 6    Period Weeks    Status New    Target Date 12/14/19      PT LONG TERM GOAL #3    Title Patient will report pain </=2/10 with all cervical motion in order to perfrom IADLs.    Time 6    Status New    Target Date 12/14/19                 Plan - 11/23/19 0943    Clinical Impression Statement RT> LT neck sore. probably from lying in bed most of day yesterday.[name]  He reported the traction felt good and  he was less sore post session. Will incr pull of traction to 14# if he still wants traction next visit. He reports he could tell increased load with green band but no increase pain    Personal Factors and Comorbidities Behavior Pattern;Finances;Comorbidity 3+;Past/Current Experience    PT Treatment/Interventions Moist Heat;Electrical Stimulation;Traction;Therapeutic activities;Functional mobility training;Therapeutic exercise;Manual techniques;Dry needling;ADLs/Self Care Home Management;Patient/family education;Taping;Passive range of motion;Cryotherapy    PT Next Visit Plan STW for upper traps, continue cervical stretches, posture, shoulder strenghtening;  traction,     assess MMT,   ROM    PT Home Exercise Plan upper trapezius stretch, self trigger point release with tennis ball,  red band row/extension . hor abduction    Consulted and Agree with Plan of Care Patient           Patient will benefit from skilled therapeutic intervention in order to improve the following deficits and impairments:  Improper body mechanics, Decreased range of motion, Decreased activity tolerance, Decreased strength, Pain  Visit Diagnosis: Cervicalgia  Abnormal posture  Cramp and spasm     Problem List Patient Active Problem List   Diagnosis Date Noted  . NSTEMI (non-ST elevated myocardial infarction) ([place])   . Precordial chest pain 04/09/2019  . Chest pain, precordial   . BPH (benign prostatic hyperplasia) 02/08/2015  . Decreased urine stream 02/08/2015  . 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 Nonpsychotic mental disorder  06/28/2008  . Tobacco abuse 06/28/2008  . Essential  hypertension 06/28/2008  . Coronary atherosclerosis 06/28/2008  . ASTHMATIC BRONCHITIS, ACUTE 06/28/2008  . Osteoarthritis 06/28/2008  . NECK PAIN 06/28/2008  . Tobacco dependence 06/28/2008  . Backache 12/24/2007  . Alcohol abuse 05/12/2007  . IRRITABLE BOWEL SYNDROME, HX OF 05/12/2007    [name] PT   11/23/2019, 10:33 AM  [facility] [address] [place], [place], [address] Phone: [phone]   Fax:  [phone]  Name: Steven Lowery MRN: [id number] Date of Birth: 01-28-1946

## 2019-11-26 LAB — DRUG SCREEN 764883 11+OXYCO+ALC+CRT-BUND
Amphetamines, Urine: NEGATIVE ng/mL
BENZODIAZ UR QL: NEGATIVE ng/mL
Barbiturate: NEGATIVE ng/mL
Cannabinoid Quant, Ur: NEGATIVE ng/mL
Cocaine (Metabolite): NEGATIVE ng/mL
Creatinine: 108.8 mg/dL (ref 20.0–300.0)
Ethanol: NEGATIVE %
Meperidine: NEGATIVE ng/mL
Methadone Screen, Urine: NEGATIVE ng/mL
Oxycodone/Oxymorphone, Urine: NEGATIVE ng/mL
Phencyclidine: NEGATIVE ng/mL
Propoxyphene: NEGATIVE ng/mL
Tramadol: NEGATIVE ng/mL
pH, Urine: 6.1 (ref 4.5–8.9)

## 2019-11-26 LAB — OPIATES CONFIRMATION, URINE
Codeine Confirm: >3000 ng/mL
Codeine: POSITIVE — AB
Hydrocodone: NEGATIVE
Hydromorphone: NEGATIVE
Morphine Confirm: 1171 ng/mL
Morphine: POSITIVE — AB
Opiates: POSITIVE ng/mL — AB

## 2019-11-30 ENCOUNTER — Other Ambulatory Visit: Payer: Self-pay

## 2019-11-30 ENCOUNTER — Ambulatory Visit: Payer: Medicare Other

## 2019-11-30 DIAGNOSIS — M542 Cervicalgia: Secondary | ICD-10-CM | POA: Diagnosis not present

## 2019-11-30 DIAGNOSIS — R293 Abnormal posture: Secondary | ICD-10-CM

## 2019-11-30 DIAGNOSIS — R252 Cramp and spasm: Secondary | ICD-10-CM

## 2019-11-30 NOTE — Therapy (Signed)
[place], [place], [address] Phone: [phone]   Fax:  [phone]  Physical Therapy Treatment/Discharge  Patient Details  Name: Steven Lowery MRN: [id number] Date of Birth: 1945/10/31 Referring Provider (PT): Dr Eunice Blase    Encounter Date: 11/30/2019   PT End of Session - 11/30/19 0942    Visit Number 5    Number of Visits 7    Date for PT Re-Evaluation 12/22/19    Authorization Type Medicare/ Mediciad    PT Start Time 0945    PT Stop Time 1030    PT Time Calculation (min) 45 min    Activity Tolerance Patient tolerated treatment well;No increased pain    Behavior During Therapy WFL for tasks assessed/performed           Past Medical History:  Diagnosis Date   Acute asthmatic bronchitis    Alcohol abuse    Back pain    Benign prostatic hypertrophy    Cigarette smoker    Coronary artery disease    DJD (degenerative joint disease)    Hypertension    IBS (irritable bowel syndrome)    Myocardial infarction ([place])    per pt he thinks he had an MI "5 years ago"   Neck pain    Psychiatric disorder     Past Surgical History:  Procedure Laterality Date   CARDIAC CATHETERIZATION     CORONARY STENT INTERVENTION N/A 04/12/2019   Procedure: CORONARY STENT INTERVENTION;  Surgeon: Lorretta Harp, MD;  Location: [place] [facility];  Service: Cardiovascular;  Laterality: N/A;   CYSTOSCOPY  1999   Dr. Janice Norrie   HAND SURGERY     LEFT HEART CATH AND CORONARY ANGIOGRAPHY N/A 04/12/2019   Procedure: LEFT HEART CATH AND CORONARY ANGIOGRAPHY;  Surgeon: Lorretta Harp, MD;  Location: [place] [facility];  Service: Cardiovascular;  Laterality: N/A;   TONSILLECTOMY      There were no vitals filed for this visit.       [facility] PT Assessment - 11/30/19 0001      AROM   Cervical Flexion 30    Cervical Extension 30    Cervical - Right Side Bend 35    Cervical - Left Side Bend 20     Cervical - Right Rotation 60    Cervical - Left Rotation 65      Strength   Right Shoulder Flexion 4+/5    Right Shoulder ABduction 4+/5    Right Shoulder Internal Rotation 4+/5    Right Shoulder External Rotation 4+/5    Left Shoulder Flexion 4+/5    Left Shoulder ABduction 4+/5    Left Shoulder Internal Rotation 4+/5    Left Shoulder External Rotation 4/5    Left Shoulder Horizontal ABduction 5/5                         [facility] Adult PT Treatment/Exercise - 11/30/19 0001      Neck Exercises: Machines for Strengthening   UBE (Upper Arm Bike)  6  min  L2      Neck Exercises: Theraband   Shoulder Extension 20 reps;Green    Rows 20 reps;Green    Horizontal ABduction 20 reps;Green      Moist Heat Therapy   Number Minutes Moist Heat --      Traction   Type of Traction Cervical    Min (lbs) 5    Max (lbs) 15  Hold Time 60    Rest Time 15    Time 12      Manual Therapy   Joint Mobilization PA and lateral glides to neck and uppr thoracic spine    Soft tissue mobilization paraspinasl and traps bilaterally                    PT Short Term Goals - 11/30/19 1033      PT SHORT TERM GOAL #1   Title Patient will demonstrate I with HEP    Status Achieved      PT SHORT TERM GOAL #2   Title Patient will improve cervical rotation >/= 10 degrees to improve functional mobility of neck.    Baseline improved but not met    Status Partially Met      PT SHORT TERM GOAL #3   Title Patient will improve UE strength to 5/5 in all directions bilaterally.    Baseline Improved but not met    Status Partially Met             PT Long Term Goals - 11/30/19 1035      PT LONG TERM GOAL #1   Title Patient will increase cervical rotation to WNL without pain to improve mobility and decrease pain.    Baseline limited but partly improved    Status Partially Met      PT LONG TERM GOAL #2   Title FOTO score will improve to    Baseline increased to 59 one point  from expected    Status Achieved      PT LONG TERM GOAL #3   Title Patient will report pain </=2/10 with all cervical motion in order to perfrom IADLs.    Baseline no pain lasgt few days    Status Achieved                 Plan - 11/30/19 0943    Clinical Impression Statement He is doing better so will see x 3 with exercise , manual and traction and if continues with less to no pain will discharge end of next week.    PT Treatment/Interventions Moist Heat;Electrical Stimulation;Traction;Therapeutic activities;Functional mobility training;Therapeutic exercise;Manual techniques;Dry needling;ADLs/Self Care Home Management;Patient/family education;Taping;Passive range of motion;Cryotherapy    PT Next Visit Plan STW for upper traps, continue cervical stretches, posture, shoulder strenghtening;  traction,     assess MMT,   ROM next session   FOTO    PT Home Exercise Plan upper trapezius stretch, self trigger point release with tennis ball,  red band row/extension . hor abduction    Consulted and Agree with Plan of Care Patient           Patient will benefit from skilled therapeutic intervention in order to improve the following deficits and impairments:  Improper body mechanics, Decreased range of motion, Decreased activity tolerance, Decreased strength, Pain  Visit Diagnosis: Cervicalgia  Abnormal posture  Cramp and spasm     Problem List Patient Active Problem List   Diagnosis Date Noted   NSTEMI (non-ST elevated myocardial infarction) ([place])    Precordial chest pain 04/09/2019   Chest pain, precordial    BPH (benign prostatic hyperplasia) 02/08/2015   Decreased urine stream 02/08/2015   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 [place], [place], [address] Phone: [phone]   Fax:  [phone]  Name: Steven Lowery MRN: [id number] Date of Birth: 1946-05-01 PHYSICAL THERAPY DISCHARGE SUMMARY  Visits from Start of Care: 5  Current functional level related to goals / functional outcomes: No pain post session and Mr Kun felt he was ready for discharge   Remaining deficits: No pain limitations per pt.    Education / Equipment: HEP Plan: Patient agrees to discharge.  Patient goals were partially met. Patient is being discharged due to being pleased with the current functional level.  ?????

## 2019-12-13 ENCOUNTER — Encounter: Payer: Self-pay | Admitting: Family Medicine

## 2019-12-13 ENCOUNTER — Other Ambulatory Visit: Payer: Self-pay

## 2019-12-13 ENCOUNTER — Ambulatory Visit (INDEPENDENT_AMBULATORY_CARE_PROVIDER_SITE_OTHER): Payer: Medicare Other | Admitting: Family Medicine

## 2019-12-13 DIAGNOSIS — M542 Cervicalgia: Secondary | ICD-10-CM | POA: Diagnosis not present

## 2019-12-13 DIAGNOSIS — M5030 Other cervical disc degeneration, unspecified cervical region: Secondary | ICD-10-CM

## 2019-12-13 MED ORDER — GABAPENTIN 100 MG PO CAPS: ORAL_CAPSULE | ORAL | 3 refills | Status: DC

## 2019-12-13 MED ORDER — ACETAMINOPHEN-CODEINE #3 300-30 MG PO TABS: 1.0000 | ORAL_TABLET | Freq: Two times a day (BID) | ORAL | 0 refills | Status: DC | PRN
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
## 2020-01-11 ENCOUNTER — Ambulatory Visit: Payer: Medicare Other | Admitting: Family Medicine

## 2020-02-18 ENCOUNTER — Ambulatory Visit: Payer: Medicare Other | Admitting: Nurse Practitioner

## 2020-03-16 ENCOUNTER — Telehealth: Payer: Self-pay | Admitting: *Deleted

## 2020-03-16 NOTE — Telephone Encounter (Signed)
A message was left, re: his follow up visit. 

## 2020-04-05 ENCOUNTER — Ambulatory Visit (INDEPENDENT_AMBULATORY_CARE_PROVIDER_SITE_OTHER): Payer: Medicare Other | Admitting: Nurse Practitioner

## 2020-04-05 ENCOUNTER — Other Ambulatory Visit: Payer: Self-pay

## 2020-04-05 ENCOUNTER — Encounter: Payer: Self-pay | Admitting: Nurse Practitioner

## 2020-04-05 VITALS — BP 142/65 | HR 79 | Temp 98.4°F | Resp 18 | Ht 74.0 in | Wt 195.6 lb

## 2020-04-05 DIAGNOSIS — K2100 Gastro-esophageal reflux disease with esophagitis, without bleeding: Secondary | ICD-10-CM

## 2020-04-05 DIAGNOSIS — N401 Enlarged prostate with lower urinary tract symptoms: Secondary | ICD-10-CM | POA: Diagnosis not present

## 2020-04-05 DIAGNOSIS — I10 Essential (primary) hypertension: Secondary | ICD-10-CM | POA: Diagnosis not present

## 2020-04-05 DIAGNOSIS — J439 Emphysema, unspecified: Secondary | ICD-10-CM

## 2020-04-05 DIAGNOSIS — Z23 Encounter for immunization: Secondary | ICD-10-CM | POA: Diagnosis not present

## 2020-04-05 DIAGNOSIS — R3911 Hesitancy of micturition: Secondary | ICD-10-CM

## 2020-04-05 MED ORDER — OMEPRAZOLE 20 MG PO CPDR: 20.0000 mg | DELAYED_RELEASE_CAPSULE | Freq: Every day | ORAL | 3 refills | Status: DC

## 2020-04-05 MED ORDER — TAMSULOSIN HCL 0.4 MG PO CAPS: 0.4000 mg | ORAL_CAPSULE | Freq: Every day | ORAL | 3 refills | Status: AC

## 2020-04-05 MED ORDER — SILDENAFIL CITRATE 20 MG PO TABS: 20.0000 mg | ORAL_TABLET | Freq: Every day | ORAL | 0 refills | Status: DC | PRN

## 2020-04-05 NOTE — Progress Notes (Signed)
[address] [place], [place]  [address] Phone:  [phone]   Fax:  [phone]   Established Patient Office Visit  Subjective:  Patient ID: Steven Lowery, male    DOB: Apr 14, 1946  Age: 74 y.o. MRN: [id number]  CC:  Chief Complaint  Patient presents with  . Follow-up    HPI Steven Lowery presents for follow up. He  has a past medical history of Acute asthmatic bronchitis, Alcohol abuse, Back pain, Benign prostatic hypertrophy, Cigarette smoker, Coronary artery disease, DJD (degenerative joint disease), Hypertension, IBS (irritable bowel syndrome), Myocardial infarction [facility]), Neck pain, and Psychiatric disorder.   Hypertension Patient is here for follow-up of elevated blood pressure. He is not exercising and is adherent to a low-salt diet. Blood pressure is well controlled at home. Cardiac symptoms: none. Patient denies chest pain, exertional chest pressure/discomfort, irregular heart beat, lower extremity edema, near-syncope, orthopnea, paroxysmal nocturnal dyspnea and syncope. Cardiovascular risk factors: advanced age (older than 46 for men, 21 for women), dyslipidemia, hypertension, male gender and sedentary lifestyle. Use of agents associated with hypertension: none. History of target organ damage: angina/ prior myocardial infarction.  Denies headache, dizziness, visual changes, shortness of breath, dyspnea on exertion, chest pain, nausea, vomiting or any edema.    Past Medical History:  Diagnosis Date  . Acute asthmatic bronchitis   . Alcohol abuse   . Back pain   . Benign prostatic hypertrophy   . Cigarette smoker   . Coronary artery disease   . DJD (degenerative joint disease)   . Hypertension   . IBS (irritable bowel syndrome)   . Myocardial infarction [facility])    per pt he thinks he had an MI "5 years ago"  . Neck pain   . Psychiatric disorder     Past Surgical History:  Procedure Laterality Date  . CARDIAC CATHETERIZATION      . CORONARY STENT INTERVENTION N/A 04/12/2019   Procedure: CORONARY STENT INTERVENTION;  Surgeon: Lorretta Harp, MD;  Location: [facility];  Service: Cardiovascular;  Laterality: N/A;  . CYSTOSCOPY  1999   Dr. Janice Norrie  . HAND SURGERY    . LEFT HEART CATH AND CORONARY ANGIOGRAPHY N/A 04/12/2019   Procedure: LEFT HEART CATH AND CORONARY ANGIOGRAPHY;  Surgeon: Lorretta Harp, MD;  Location: [facility];  Service: Cardiovascular;  Laterality: N/A;  . TONSILLECTOMY      Family History  Problem Relation Age of Onset  . Breast cancer Sister   . Heart disease Sister   . Heart attack Father   . Heart disease Brother   . Colon cancer Neg Hx   . Esophageal cancer Neg Hx   . Rectal cancer Neg Hx   . Stomach cancer Neg Hx     Social History   Socioeconomic History  . Marital status: Married    Spouse name: Not on file  . Number of children: Not on file  . Years of education: 31  . Highest education level: 11th grade  Occupational History  . Occupation: Retired  Tobacco Use  . Smoking status: Former Smoker    Packs/day: 0.30    Years: 40.00    Pack years: 12.00    Types: Cigarettes    Quit date: 05/24/2019    Years since quitting: 0.8  . Smokeless tobacco: Never Used  . Tobacco comment: has cut back alot per pt  Substance and Sexual Activity  . Alcohol use: Yes  Alcohol/week: 6.0 standard drinks    Types: 6 Shots of liquor per week    Comment: quit 3 months ago  . Drug use: No  . Sexual activity: Yes  Other Topics Concern  . Not on file  Social History Narrative  . Not on file   Social Determinants of Health   Financial Resource Strain:   . Difficulty of Paying Living Expenses: Not on file  Food Insecurity:   . Worried About Charity fundraiser in the Last Year: Not on file  . Ran Out of Food in the Last Year: Not on file  Transportation Needs:   . Lack of Transportation (Medical): Not on file  . Lack of Transportation (Non-Medical): Not on file   Physical Activity:   . Days of Exercise per Week: Not on file  . Minutes of Exercise per Session: Not on file  Stress:   . Feeling of Stress : Not on file  Social Connections:   . Frequency of Communication with Friends and Family: Not on file  . Frequency of Social Gatherings with Friends and Family: Not on file  . Attends Religious Services: Not on file  . Active Member of Clubs or Organizations: Not on file  . Attends Archivist Meetings: Not on file  . Marital Status: Not on file  Intimate Partner Violence:   . Fear of Current or Ex-Partner: Not on file  . Emotionally Abused: Not on file  . Physically Abused: Not on file  . Sexually Abused: Not on file    Outpatient Medications Prior to Visit  Medication Sig Dispense Refill  . albuterol (VENTOLIN HFA) 108 (90 Base) MCG/ACT inhaler Inhale 1-2 puffs into the lungs every 6 (six) hours as needed for wheezing or shortness of breath. 1 g 3  . Ascorbic Acid (VITAMIN C PO) Take 1 tablet by mouth daily.    [name] aspirin EC 81 MG tablet Take 1 tablet (81 mg total) by mouth daily. 90 tablet 3  . atorvastatin (LIPITOR) 40 MG tablet Take 1 tablet (40 mg total) by mouth daily at 6 PM. 90 tablet 3  . docusate sodium (COLACE) 100 MG capsule Take 100 mg by mouth daily.    . ferrous sulfate 325 (65 FE) MG tablet Take 325 mg by mouth daily with breakfast.    . fish oil-omega-3 fatty acids 1000 MG capsule Take 1 g by mouth daily.     . fluticasone (VERAMYST) 27.5 MCG/SPRAY nasal spray Place 2 sprays into the nose daily.    . hydroxypropyl methylcellulose / hypromellose (ISOPTO TEARS / GONIOVISC) 2.5 % ophthalmic solution Place 1 drop into both eyes daily as needed for dry eyes.    [name] lidocaine (XYLOCAINE) 5 % ointment Apply 1 application topically 4 (four) times daily as needed. 35.44 g 0  . losartan (COZAAR) 100 MG tablet Take 1 tablet (100 mg total) by mouth daily. 90 tablet 3  . Multiple Vitamin (MULTIVITAMIN WITH MINERALS) TABS tablet Take  1 tablet by mouth daily.    . clopidogrel (PLAVIX) 75 MG tablet Take 1 tablet (75 mg total) by mouth daily. (Patient not taking: Reported on 04/06/2020) 90 tablet 3  . omeprazole (PRILOSEC) 20 MG capsule Take 1 capsule (20 mg total) by mouth daily. 90 capsule 3  . sildenafil (REVATIO) 20 MG tablet TAKE 1 TABLET BY MOUTH EVERY DAY AS NEEDED 20 tablet 0  . tamsulosin (FLOMAX) 0.4 MG CAPS capsule Take 0.4 mg by mouth daily.    [name]  acetaminophen-codeine (TYLENOL #3) 300-30 MG tablet Take 1 tablet by mouth 2 (two) times daily as needed for moderate pain. 60 tablet 0  . gabapentin (NEURONTIN) 100 MG capsule 1 PO q HS, may increase to 3 PO q HS if needed 90 capsule 3  . nitroGLYCERIN (NITROSTAT) 0.4 MG SL tablet Place 1 tablet (0.4 mg total) under the tongue every 5 (five) minutes as needed for chest pain. 15 tablet 25  . baclofen (LIORESAL) 10 MG tablet Take by mouth. (Patient not taking: Reported on 04/05/2020)     Facility-Administered Medications Prior to Visit  Medication Dose Route Frequency Provider Last Rate Last Admin  . 0.9 %  sodium chloride infusion  500 mL Intravenous Continuous Ladene Artist, MD        Allergies  Allergen Reactions  . Brilinta [Ticagrelor] Shortness Of Breath  . Lisinopril Cough    ROS Review of Systems  Constitutional: Negative.   HENT: Negative.   Eyes: Negative.   Respiratory: Negative.  Negative for shortness of breath.   Cardiovascular: Negative.  Negative for chest pain.  Gastrointestinal: Negative for blood in stool, constipation, diarrhea and nausea.  Endocrine: Negative.   Genitourinary: Negative for difficulty urinating, discharge, dysuria, frequency and penile pain.  Skin: Negative.   Allergic/Immunologic: Negative.   Neurological: Negative.   Hematological: Negative.   Psychiatric/Behavioral: Negative.       Objective:    Physical Exam HENT:     Head: Normocephalic and atraumatic.  Cardiovascular:     Rate and Rhythm: Normal rate and  regular rhythm.     Pulses: Normal pulses.     Heart sounds: Normal heart sounds.  Pulmonary:     Effort: Pulmonary effort is normal.     Breath sounds: Normal breath sounds.  Abdominal:     General: Abdomen is flat. Bowel sounds are normal.     Palpations: Abdomen is soft.  Musculoskeletal:        General: Normal range of motion.     Cervical back: Normal range of motion.  Skin:    General: Skin is warm and dry.     Capillary Refill: Capillary refill takes less than 2 seconds.  Neurological:     General: No focal deficit present.     Mental Status: He is alert and oriented to person, place, and time.  Psychiatric:        Mood and Affect: Mood normal.     BP (!) 142/65 (BP Location: Left Arm, Patient Position: Sitting, Cuff Size: Normal)   Pulse 79   Temp 98.4 F (36.9 C) (Temporal)   Resp 18   Ht 6\' 2"  (1.88 m)   Wt 195 lb 9.6 oz (88.7 kg)   SpO2 99%   BMI 25.11 kg/m  Wt Readings from Last 3 Encounters:  04/06/20 195 lb (88.5 kg)  04/05/20 195 lb 9.6 oz (88.7 kg)  11/19/19 186 lb 0.4 oz (84.4 kg)     There are no preventive care reminders to display for this patient.  There are no preventive care reminders to display for this patient.  Lab Results  Component Value Date   TSH 0.946 04/17/2015   Lab Results  Component Value Date   WBC 4.8 06/22/2019   HGB 14.1 06/22/2019   HCT 41.9 06/22/2019   MCV 92.5 06/22/2019   PLT 258 06/22/2019   Lab Results  Component Value Date   NA 141 11/19/2019   K 4.1 11/19/2019   CO2 24 06/22/2019   GLUCOSE  101 (H) 11/19/2019   BUN 11 11/19/2019   CREATININE 1.10 11/19/2019   BILITOT 0.3 11/19/2019   ALKPHOS 99 11/19/2019   AST 17 11/19/2019   ALT 20 02/02/2018   PROT 6.6 11/19/2019   ALBUMIN 4.1 11/19/2019   CALCIUM 9.3 11/19/2019   ANIONGAP 9 06/22/2019   GFR 84.97 10/27/2012   Lab Results  Component Value Date   CHOL 160 08/20/2019   Lab Results  Component Value Date   HDL 76 08/20/2019   Lab Results    Component Value Date   LDLCALC 72 08/20/2019   Lab Results  Component Value Date   TRIG 57 08/20/2019   Lab Results  Component Value Date   CHOLHDL 2.1 08/20/2019   Lab Results  Component Value Date   HGBA1C 5.9 (H) 04/11/2019      Assessment & Plan:   Problem List Items Addressed This Visit      Cardiovascular and Mediastinum   Essential hypertension Encouraged on going compliance with current medication regimen Encouraged home monitoring and recording BP <130/80 Eating a heart-healthy diet with less salt Encouraged regular physical activity     Relevant Medications   sildenafil (REVATIO) 20 MG tablet     Respiratory   COPD with emphysema (HCC) Stable continue with albuterol inhaler use as needed and remain tobacco free     Genitourinary   BPH (benign prostatic hyperplasia) Stable continue with tamsulosin   Relevant Medications   tamsulosin (FLOMAX) 0.4 MG CAPS capsule    Other Visit Diagnoses    Flu vaccine need    -  Primary   Relevant Orders   Flu Vaccine QUAD High Dose(Fluad) (Completed)   Gastroesophageal reflux disease with esophagitis without hemorrhage     Stable continue with omeprazole 20 mg daily Discussed diet      Meds ordered this encounter  Medications  . omeprazole (PRILOSEC) 20 MG capsule    Sig: Take 1 capsule (20 mg total) by mouth daily.    Dispense:  90 capsule    Refill:  3    Order Specific Question:   Supervising Provider    Answer:   Tresa Garter [id number]  . sildenafil (REVATIO) 20 MG tablet    Sig: Take 1 tablet (20 mg total) by mouth daily as needed.    Dispense:  20 tablet    Refill:  0    Order Specific Question:   Supervising Provider    Answer:   Tresa Garter [id number]  . tamsulosin (FLOMAX) 0.4 MG CAPS capsule    Sig: Take 1 capsule (0.4 mg total) by mouth daily.    Dispense:  90 capsule    Refill:  3    Order Specific Question:   Supervising Provider    Answer:   Tresa Garter [[id number]]     Follow-up: Return in about 6 months (around 10/04/2020).    Vevelyn Francois, NP

## 2020-04-05 NOTE — Progress Notes (Signed)
Pulled muscle to L side x 2-3 days ago, requesting Tylenol #3   Needs RF on albuterol inhaler, Flomax and Plavix   Flu vaccine today

## 2020-04-05 NOTE — Progress Notes (Signed)
Virtual Visit via Telephone Note   This visit type was conducted due to national recommendations for restrictions regarding the COVID-19 Pandemic (e.g. social distancing) in an effort to limit this patient's exposure and mitigate transmission in our community.  Due to his co-morbid illnesses, this patient is at least at moderate risk for complications without adequate follow up.  This format is felt to be most appropriate for this patient at this time.  The patient did not have access to video technology/had technical difficulties with video requiring transitioning to audio format only (telephone).  All issues noted in this document were discussed and addressed.  No physical exam could be performed with this format.  Please refer to the patient's chart for his  consent to telehealth for [facility].    Date:  04/06/2020   ID:  Steven Lowery, DOB 1946-01-02, MRN [id number] The patient was identified using 2 identifiers.  Patient Location: Home Provider Location: Office/Clinic  PCP:  Vevelyn Francois, NP  Cardiologist:  Gustavo Meditz [place], MD  Electrophysiologist:  None   Evaluation Performed:  Follow-Up Visit  Chief Complaint:  CAD  History of Present Illness:    Steven Lowery is a 74 y.o. male who is present  for follow up CAD. He has a history of CAD s/p NSTEMI s/p DES to LAD on 04/12/2019, hypertension,prediabetes,asthma,tobacco abuse, and alcohol abuse.  Patient admitted from 04/09/2019 to 04/13/2019 for NSTEMI after presenting with a 3 day history of non-radiating chest pain. Troponin mildly elevated and peaked at 84 but remained relatively flat. Therefore, he underwent Lexiscan Myoview on 04/10/2019 which showed a large fixed perfusion defect in inferior portions of myocardium suggestive of prior MI and a large perfusion defect with partial reversibility in the anterior wall, anteroseptum, and apex suggestive of ischemia. Considered high risk study so cardiac catheterization was  performed on 04/12/2019 and showed 90% stenosis of mid LAD. LV systolic function and LVEDP were normal. Patient underwent successful PCI with DES to this lesion. He tolerated the procedure well but later that evening he did develop brief episodes of bradycardia with junctional rhythm with heart rates dropping to the 30's and had associated dizziness and lightheadedness with this. Therefore, beta blocker was not added. Patient was discharged on Aspirin 81mg  daily, Brilinta 90mg  twice daily, Losartan 25mg  daily, and Lipitor 40mg  daily.When seen in follow up in December he was doing well but still smoking. He was seen in the ED in February with some chest pain that was felt to be muscular.  On subsequent visit he had quit smoking. Losartan increased to 50 mg daily. Brilinta switched to Plavix due to SOB.  He reports he is still smoking some. Has gained about 10 lbs. States he is eating healthy but just too much. No chest pain or palpitations. No edema. Occasional SOB for which he uses inhaler.   The patient does not have symptoms concerning for COVID-19 infection (fever, chills, cough, or new shortness of breath).    Past Medical History:  Diagnosis Date  . Acute asthmatic bronchitis   . Alcohol abuse   . Back pain   . Benign prostatic hypertrophy   . Cigarette smoker   . Coronary artery disease   . DJD (degenerative joint disease)   . Hypertension   . IBS (irritable bowel syndrome)   . Myocardial infarction [facility])    per pt he thinks he had an MI "5 years ago"  . Neck pain   . Psychiatric disorder  Past Surgical History:  Procedure Laterality Date  . CARDIAC CATHETERIZATION    . CORONARY STENT INTERVENTION N/A 04/12/2019   Procedure: CORONARY STENT INTERVENTION;  Surgeon: Lorretta Harp, MD;  Location: [place] CV LAB;  Service: Cardiovascular;  Laterality: N/A;  . CYSTOSCOPY  1999   Dr. Janice Norrie  . HAND SURGERY    . LEFT HEART CATH AND CORONARY ANGIOGRAPHY N/A 04/12/2019    Procedure: LEFT HEART CATH AND CORONARY ANGIOGRAPHY;  Surgeon: Lorretta Harp, MD;  Location: [place] CV LAB;  Service: Cardiovascular;  Laterality: N/A;  . TONSILLECTOMY       Current Meds  Medication Sig  . acetaminophen-codeine (TYLENOL #3) 300-30 MG tablet Take 1 tablet by mouth 2 (two) times daily as needed for moderate pain.  [name] albuterol (VENTOLIN HFA) 108 (90 Base) MCG/ACT inhaler Inhale 1-2 puffs into the lungs every 6 (six) hours as needed for wheezing or shortness of breath.  . Ascorbic Acid (VITAMIN C PO) Take 1 tablet by mouth daily.  [name] aspirin EC 81 MG tablet Take 1 tablet (81 mg total) by mouth daily.  [name] atorvastatin (LIPITOR) 40 MG tablet Take 1 tablet (40 mg total) by mouth daily at 6 PM.  . docusate sodium (COLACE) 100 MG capsule Take 100 mg by mouth daily.  . ferrous sulfate 325 (65 FE) MG tablet Take 325 mg by mouth daily with breakfast.  . fish oil-omega-3 fatty acids 1000 MG capsule Take 1 g by mouth daily.   . fluticasone (VERAMYST) 27.5 MCG/SPRAY nasal spray Place 2 sprays into the nose daily.  [name] gabapentin (NEURONTIN) 100 MG capsule 1 PO q HS, may increase to 3 PO q HS if needed  . hydroxypropyl methylcellulose / hypromellose (ISOPTO TEARS / GONIOVISC) 2.5 % ophthalmic solution Place 1 drop into both eyes daily as needed for dry eyes.  [name] lidocaine (XYLOCAINE) 5 % ointment Apply 1 application topically 4 (four) times daily as needed.  [name] losartan (COZAAR) 100 MG tablet Take 1 tablet (100 mg total) by mouth daily.  . Multiple Vitamin (MULTIVITAMIN WITH MINERALS) TABS tablet Take 1 tablet by mouth daily.  . nitroGLYCERIN (NITROSTAT) 0.4 MG SL tablet Place 1 tablet (0.4 mg total) under the tongue every 5 (five) minutes as needed for chest pain.  [name] omeprazole (PRILOSEC) 20 MG capsule Take 1 capsule (20 mg total) by mouth daily.  . sildenafil (REVATIO) 20 MG tablet Take 1 tablet (20 mg total) by mouth daily as needed.  . tamsulosin (FLOMAX) 0.4 MG CAPS capsule Take 1  capsule (0.4 mg total) by mouth daily.   Current Facility-Administered Medications for the 04/06/20 encounter (Telemedicine) with [place], Json Koelzer M, MD  Medication  . 0.9 %  sodium chloride infusion     Allergies:   Brilinta [ticagrelor] and Lisinopril   Social History   Tobacco Use  . Smoking status: Former Smoker    Packs/day: 0.30    Years: 40.00    Pack years: 12.00    Types: Cigarettes    Quit date: 05/24/2019    Years since quitting: 0.8  . Smokeless tobacco: Never Used  . Tobacco comment: has cut back alot per pt  Substance Use Topics  . Alcohol use: Yes    Alcohol/week: 6.0 standard drinks    Types: 6 Shots of liquor per week    Comment: quit 3 months ago  . Drug use: No     Family Hx: The patient's family history includes Breast cancer in his sister; Heart attack  in his father; Heart disease in his brother and sister. There is no history of Colon cancer, Esophageal cancer, Rectal cancer, or Stomach cancer.  ROS:   Please see the history of present illness.     All other systems reviewed and are negative.   Prior CV studies:   The following studies were reviewed today:  Lexiscan Myoview 04/10/2019:  Defect 1: There is a large defect of severe severity present in the basal inferoseptal, basal inferior, basal inferolateral, mid inferoseptal, mid inferior, mid inferolateral, apical inferior, apical lateral and apex location.  Defect 2: There is a large defect present in the basal anterior, basal anteroseptal, mid anterior, mid anteroseptal and apex location.  Findings consistent with ischemia and prior myocardial infarction.  This is a high risk study.  The left ventricular ejection fraction is mildly decreased (45-54%).  Nuclear stress EF: 46%.  Horizontal ST segment depression ST segment depression of 2 mm was noted during stress in the II, III and aVF leads, beginning at 1 minutes of stress, and returning to baseline after 5-9 minutes of recovery.  T wave  inversion was noted during stress in the II, III, aVF, V4, V5 and V6 leads. T wave inversion persisted.  There is a large fixed perfusion defect in the inferoseptum, inferior wall, and inferolateral wall from apex to base. This suggests prior myocardial infarction. Similar to what is described on the prior stress test from 2012.  There is a large perfusion defect with partial reversibility in the anterior wall, anteroseptum, and apex. At the apex the defect is primarily fixed, with reversibility from absent to severe in the anteroapex. At the mid ventricle the anterior wall is is partially reversible from moderate to mild and at the base is reversible from moderate to normal. The anteroseptum at the mid ventricle has a severe perfusion defect at stress reversible to moderate at the mid ventricle and mild at the base. The study is positive for ischemia with partial reversibility suggesting possible peri-infarct ischemia.  Electrocardiogram had baseline T wave changes inferiorly and laterally, ST segments in these areas worsened with 2 mm of depression in aVF and 1 mm in 2, 3, V4, V5. The patient's blood pressure was elevated with a systolic blood pressure 786 mmHg with stress.  Ejection fraction is reduced at 46% confirmed with wall motion abnormalities inferiorly and in the anteroseptum.  High risk stress test findings given previous infarct, peri-infarct ischemia, and reduced ejection fraction as well as stress ECG findings. _______________  Left Heart Catheterization 04/12/2019:  Mid LAD lesion is 90% stenosed.  A drug-eluting stent was successfully placed using a STENT SYNERGY DES 3X20.  Post intervention, there is a 0% residual stenosis.  The left ventricular systolic function is normal.  LV end diastolic pressure is normal.  The left ventricular ejection fraction is 55-65% by visual estimate.  Impression:Successful mid LAD PCI and drug-eluting stenting using a synergy 3  mm x 20 mm long drug-eluting stent postdilated to 3.54 mm. Patient has no other CAD and has normal LV function. He can be discharged home tomorrow on dual antiplatelet therapy uninterrupted for 1 year. Dr. Angelena Form was made aware of these results. The patient left lab in stable condition.   Labs/Other Tests and Data Reviewed:    EKG:  No ECG reviewed.  Recent Labs: 06/22/2019: Hemoglobin 14.1; Platelets 258 07/07/2019: Magnesium 1.8 11/19/2019: BUN 11; Creatinine, Ser 1.10; Potassium 4.1; Sodium 141   Recent Lipid Panel Lab Results  Component Value Date/Time  CHOL 160 08/20/2019 08:52 AM   TRIG 57 08/20/2019 08:52 AM   HDL 76 08/20/2019 08:52 AM   CHOLHDL 2.1 08/20/2019 08:52 AM   CHOLHDL 2.0 04/11/2019 04:43 AM   LDLCALC 72 08/20/2019 08:52 AM    Wt Readings from Last 3 Encounters:  04/06/20 195 lb (88.5 kg)  04/05/20 195 lb 9.6 oz (88.7 kg)  11/19/19 186 lb 0.4 oz (84.4 kg)     Risk Assessment/Calculations:      Objective:    Vital Signs:  BP (!) 142/65   Pulse 79   Ht 6\' 2"  (1.88 m)   Wt 195 lb (88.5 kg)   BMI 25.04 kg/m    VITAL SIGNS:  reviewed  ASSESSMENT & PLAN:    1.CADs/pNSTEMI in December 2020 with stenting of LAD with DES - Cardiac catheterization showed 90% stenosis of LAD which was treated with DES.  -No recurrent chest pain. -Continue ASA and Plavix for one year from stent.  - Continue aggressive secondary prevention.   2.Hypertension - BPis elevated  -will increaseLosartan to 100mg  daily. - avoid beta blocker due to bradycardia  3.Prediabetes - Hemoglobin A1c 5.9 during recent admission. - Discussed importance of lifestyle modification including staying active and following heart healthy diet. -Follow-upwith PCP.  4.Tobacco Abuse -Patientreports he has quit. - Congratulated patient .   Shared Decision Making/Informed Consent        COVID-19 Education: The signs and symptoms of COVID-19 were discussed with  the patient and how to seek care for testing (follow up with PCP or arrange E-visit).  The importance of social distancing was discussed today.  Time:   Today, I have spent 15 minutes with the patient with telehealth technology discussing the above problems.     Medication Adjustments/Labs and Tests Ordered: Current medicines are reviewed at length with the patient today.  Concerns regarding medicines are outlined above.   Tests Ordered: No orders of the defined types were placed in this encounter.   Medication Changes: No orders of the defined types were placed in this encounter.   Follow Up:  In person in 6 months with labs.   Signed, Evella Kasal [place], MD  04/06/2020 8:53 AM    [facility]

## 2020-04-05 NOTE — Patient Instructions (Signed)

## 2020-04-06 ENCOUNTER — Telehealth (INDEPENDENT_AMBULATORY_CARE_PROVIDER_SITE_OTHER): Payer: Medicare Other | Admitting: Cardiology

## 2020-04-06 ENCOUNTER — Encounter: Payer: Self-pay | Admitting: Cardiology

## 2020-04-06 ENCOUNTER — Telehealth: Payer: Self-pay

## 2020-04-06 VITALS — BP 142/65 | HR 79 | Ht 74.0 in | Wt 195.0 lb

## 2020-04-06 DIAGNOSIS — I10 Essential (primary) hypertension: Secondary | ICD-10-CM | POA: Diagnosis not present

## 2020-04-06 DIAGNOSIS — Z720 Tobacco use: Secondary | ICD-10-CM | POA: Diagnosis not present

## 2020-04-06 DIAGNOSIS — I2510 Atherosclerotic heart disease of native coronary artery without angina pectoris: Secondary | ICD-10-CM | POA: Diagnosis not present

## 2020-04-06 DIAGNOSIS — R7303 Prediabetes: Secondary | ICD-10-CM | POA: Diagnosis not present

## 2020-04-06 NOTE — Patient Instructions (Addendum)
Medication Instructions:  Finish bottle of Plavix then stop Continue all other medications *If you need a refill on your cardiac medications before your next appointment, please call your pharmacy*   Lab Work: Have bmet,lipid and hepatic panels done about 1 week before June appointment. Lab order enclosed.No appointment needed.Come to lab at [place] office between 8:00 am to 12:00 noon.No food after midnight the night before [place] only.   Testing/Procedures: None ordered   Follow-Up: At [facility], you and your health needs are our priority.  As part of our continuing mission to provide you with exceptional heart care, we have created designated Provider Care Teams.  These Care Teams include your primary Cardiologist (physician) and Advanced Practice Providers (APPs -  Physician Assistants and Nurse Practitioners) who all work together to provide you with the care you need, when you need it.  We recommend signing up for the patient portal called "MyChart".  Sign up information is provided on this After Visit Summary.  MyChart is used to connect with patients for Virtual Visits (Telemedicine).  Patients are able to view lab/test results, encounter notes, upcoming appointments, etc.  Non-urgent messages can be sent to your provider as well.   To learn more about what you can do with MyChart, go to [email or web address].    Your next appointment: 6 months   Call in March to schedule June appointment      The format for your next appointment: Office     Provider:  Dr.Jordan

## 2020-04-06 NOTE — Telephone Encounter (Signed)
[name], milnes are scheduled for a virtual visit with your provider today.    Just as we do with appointments in the office, we must obtain your consent to participate.  Your consent will be active for this visit and any virtual visit you may have with one of our providers in the next 365 days.    If you have a MyChart account, I can also send a copy of this consent to you electronically.  All virtual visits are billed to your insurance company just like a traditional visit in the office.  As this is a virtual visit, video technology does not allow for your provider to perform a traditional examination.  This may limit your provider's ability to fully assess your condition.  If your provider identifies any concerns that need to be evaluated in person or the need to arrange testing such as labs, EKG, etc, we will make arrangements to do so.    Although advances in technology are sophisticated, we cannot ensure that it will always work on either your end or our end.  If the connection with a video visit is poor, we may have to switch to a telephone visit.  With either a video or telephone visit, we are not always able to ensure that we have a secure connection.   I need to obtain your verbal consent now.   Are you willing to proceed with your visit today?   Steven Lowery has provided verbal consent on 04/06/2020 for a virtual visit (video or telephone).   Kathyrn Lass, LPN [id number]  3:88 AM

## 2020-06-26 ENCOUNTER — Other Ambulatory Visit: Payer: Self-pay | Admitting: Cardiology

## 2020-07-03 ENCOUNTER — Emergency Department (HOSPITAL_COMMUNITY)
Admission: EM | Admit: 2020-07-03 | Discharge: 2020-07-03 | Disposition: A | Discharge status: Home / Self Care | Payer: Medicare Other | Attending: Emergency Medicine | Admitting: Emergency Medicine

## 2020-07-03 ENCOUNTER — Encounter (HOSPITAL_COMMUNITY): Payer: Self-pay | Admitting: *Deleted

## 2020-07-03 ENCOUNTER — Other Ambulatory Visit: Payer: Self-pay

## 2020-07-03 DIAGNOSIS — R112 Nausea with vomiting, unspecified: Secondary | ICD-10-CM | POA: Diagnosis not present

## 2020-07-03 DIAGNOSIS — Z87891 Personal history of nicotine dependence: Secondary | ICD-10-CM | POA: Diagnosis not present

## 2020-07-03 DIAGNOSIS — R110 Nausea: Secondary | ICD-10-CM

## 2020-07-03 DIAGNOSIS — J449 Chronic obstructive pulmonary disease, unspecified: Secondary | ICD-10-CM | POA: Diagnosis not present

## 2020-07-03 DIAGNOSIS — R197 Diarrhea, unspecified: Secondary | ICD-10-CM | POA: Diagnosis not present

## 2020-07-03 DIAGNOSIS — Z79899 Other long term (current) drug therapy: Secondary | ICD-10-CM | POA: Insufficient documentation

## 2020-07-03 DIAGNOSIS — R059 Cough, unspecified: Secondary | ICD-10-CM | POA: Diagnosis not present

## 2020-07-03 DIAGNOSIS — I10 Essential (primary) hypertension: Secondary | ICD-10-CM | POA: Diagnosis not present

## 2020-07-03 DIAGNOSIS — Z7982 Long term (current) use of aspirin: Secondary | ICD-10-CM | POA: Insufficient documentation

## 2020-07-03 DIAGNOSIS — J45909 Unspecified asthma, uncomplicated: Secondary | ICD-10-CM | POA: Diagnosis not present

## 2020-07-03 DIAGNOSIS — Z7952 Long term (current) use of systemic steroids: Secondary | ICD-10-CM | POA: Insufficient documentation

## 2020-07-03 DIAGNOSIS — I2510 Atherosclerotic heart disease of native coronary artery without angina pectoris: Secondary | ICD-10-CM | POA: Insufficient documentation

## 2020-07-03 DIAGNOSIS — R053 Chronic cough: Secondary | ICD-10-CM

## 2020-07-03 LAB — URINALYSIS, ROUTINE W REFLEX MICROSCOPIC
Bacteria, UA: NONE SEEN
Bilirubin Urine: NEGATIVE
Glucose, UA: NEGATIVE mg/dL
Ketones, ur: NEGATIVE mg/dL
Leukocytes,Ua: NEGATIVE
Nitrite: NEGATIVE
Protein, ur: NEGATIVE mg/dL
Specific Gravity, Urine: 1.012 (ref 1.005–1.030)
pH: 5 (ref 5.0–8.0)

## 2020-07-03 LAB — CBC
HCT: 50.5 % (ref 39.0–52.0)
Hemoglobin: 16.8 g/dL (ref 13.0–17.0)
MCH: 30.5 pg (ref 26.0–34.0)
MCHC: 33.3 g/dL (ref 30.0–36.0)
MCV: 91.8 fL (ref 80.0–100.0)
Platelets: 255 10*3/uL (ref 150–400)
RBC: 5.5 MIL/uL (ref 4.22–5.81)
RDW: 14 % (ref 11.5–15.5)
WBC: 4.2 10*3/uL (ref 4.0–10.5)
nRBC: 0 % (ref 0.0–0.2)

## 2020-07-03 LAB — COMPREHENSIVE METABOLIC PANEL
ALT: 23 U/L (ref 0–44)
AST: 26 U/L (ref 15–41)
Albumin: 4.1 g/dL (ref 3.5–5.0)
Alkaline Phosphatase: 70 U/L (ref 38–126)
Anion gap: 14 (ref 5–15)
BUN: 30 mg/dL — ABNORMAL HIGH (ref 8–23)
CO2: 19 mmol/L — ABNORMAL LOW (ref 22–32)
Calcium: 9.9 mg/dL (ref 8.9–10.3)
Chloride: 103 mmol/L (ref 98–111)
Creatinine, Ser: 1.41 mg/dL — ABNORMAL HIGH (ref 0.61–1.24)
GFR, Estimated: 52 mL/min — ABNORMAL LOW (ref >60)
Glucose, Bld: 113 mg/dL — ABNORMAL HIGH (ref 70–99)
Potassium: 4.2 mmol/L (ref 3.5–5.1)
Sodium: 136 mmol/L (ref 135–145)
Total Bilirubin: 0.7 mg/dL (ref 0.3–1.2)
Total Protein: 7.8 g/dL (ref 6.5–8.1)

## 2020-07-03 LAB — LIPASE, BLOOD: Lipase: 34 U/L (ref 11–51)

## 2020-07-03 MED ORDER — ONDANSETRON 4 MG PO TBDP: 4.0000 mg | ORAL_TABLET | Freq: Three times a day (TID) | ORAL | 0 refills | Status: AC | PRN

## 2020-07-03 MED ORDER — BENZONATATE 100 MG PO CAPS: 100.0000 mg | ORAL_CAPSULE | Freq: Three times a day (TID) | ORAL | 0 refills | Status: AC

## 2020-07-03 MED ORDER — ONDANSETRON 4 MG PO TBDP
4.0000 mg | ORAL_TABLET | Freq: Once | ORAL | Status: AC
  Administered 2020-07-03: 4 mg via ORAL
  Filled 2020-07-03: qty 1

## 2020-07-03 NOTE — Discharge Instructions (Addendum)
Take Zofran for nausea every 8 hours as needed. Push fluids, and advance diet as tolerated.   Take Tessalon for cough. It is important to swallow the tablet whole and do not chew.   Follow up with your doctor for recheck in 2-3 days to insure your symptoms are improving.

## 2020-07-03 NOTE — ED Provider Notes (Signed)
  Face-to-face evaluation   History: He presents for evaluation of nausea, vomiting and diarrhea.  He reports onset of symptoms shortly after ingesting some food from a restaurant.  He denies blood in emesis or stool.  He denies fever, chills, ongoing abdominal pain, weakness or dizziness.  No known sick contacts.  Physical exam: Male alert and cooperative.  He appears comfortable.  Abdomen soft nontender palpation.  Medical screening examination/treatment/procedure(s) were conducted as a shared visit with non-physician practitioner(s) and myself.  I personally evaluated the patient during the encounter    Daleen Bo, MD 07/04/20 [phone]

## 2020-07-03 NOTE — ED Notes (Signed)
Pt does not want to get into a gown or wear monitor

## 2020-07-03 NOTE — ED Provider Notes (Signed)
[place] EMERGENCY DEPARTMENT Provider Note   CSN: [id number] Arrival date & time: 07/03/20  1543     History Chief Complaint  Patient presents with  . Emesis    Steven Lowery is a 75 y.o. male.  Patient to ED with complaint of nausea, vomiting and diarrhea that started 3 days ago. No fever, abdominal pain, hematemesis or bloody stools. He reports eating chinese take out food the night before symptoms started and woke up the next morning (07/01/20) with symptoms that have improved over time. Today, he complains of nausea without vomiting when he attempts any PO and a small amount of loose stools. No previous abdominal surgeries. No chest pain, SOB. He complains of a dry cough x 1 month.   The history is provided by the patient. No language interpreter was used.  Emesis Associated symptoms: cough and diarrhea   Associated symptoms: no abdominal pain, no chills, no fever and no myalgias        Past Medical History:  Diagnosis Date  . Acute asthmatic bronchitis   . Alcohol abuse   . Back pain   . Benign prostatic hypertrophy   . Cigarette smoker   . Coronary artery disease   . DJD (degenerative joint disease)   . Hypertension   . IBS (irritable bowel syndrome)   . Myocardial infarction [facility])    per pt he thinks he had an MI "5 years ago"  . Neck pain   . Psychiatric disorder     Patient Active Problem List   Diagnosis Date Noted  . NSTEMI (non-ST elevated myocardial infarction) ([place])   . Precordial chest pain 04/09/2019  . Chest pain, precordial   . BPH (benign prostatic hyperplasia) 02/08/2015  . Decreased urine stream 02/08/2015  . 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 Tobacco abuse 06/28/2008  . Essential hypertension 06/28/2008  . Coronary atherosclerosis 06/28/2008  . ASTHMATIC BRONCHITIS, ACUTE 06/28/2008  . Osteoarthritis 06/28/2008  . NECK PAIN 06/28/2008  . Tobacco dependence 06/28/2008   . Backache 12/24/2007  . Alcohol abuse 05/12/2007  . IRRITABLE BOWEL SYNDROME, HX OF 05/12/2007    Past Surgical History:  Procedure Laterality Date  . CARDIAC CATHETERIZATION    . CORONARY STENT INTERVENTION N/A 04/12/2019   Procedure: CORONARY STENT INTERVENTION;  Surgeon: Lorretta Harp, MD;  Location: [place] CV LAB;  Service: Cardiovascular;  Laterality: N/A;  . CYSTOSCOPY  1999   Dr. Janice Norrie  . HAND SURGERY    . LEFT HEART CATH AND CORONARY ANGIOGRAPHY N/A 04/12/2019   Procedure: LEFT HEART CATH AND CORONARY ANGIOGRAPHY;  Surgeon: Lorretta Harp, MD;  Location: [place] CV LAB;  Service: Cardiovascular;  Laterality: N/A;  . TONSILLECTOMY         Family History  Problem Relation Age of Onset  . Breast cancer Sister   . Heart disease Sister   . Heart attack Father   . Heart disease Brother   . Colon cancer Neg Hx   . Esophageal cancer Neg Hx   . Rectal cancer Neg Hx   . Stomach cancer Neg Hx     Social History   Tobacco Use  . Smoking status: Former Smoker    Packs/day: 0.30    Years: 40.00    Pack years: 12.00    Types: Cigarettes    Quit date: 05/24/2019    Years since quitting: 1.1  . Smokeless tobacco: Never Used  . Tobacco comment: has  cut back alot per pt  Substance Use Topics  . Alcohol use: Yes    Alcohol/week: 6.0 standard drinks    Types: 6 Shots of liquor per week    Comment: quit 3 months ago  . Drug use: No    Home Medications Prior to Admission medications   Medication Sig Start Date End Date Taking? Authorizing Provider  acetaminophen-codeine (TYLENOL #3) 300-30 MG tablet Take 1 tablet by mouth 2 (two) times daily as needed for moderate pain. 12/13/19   Hilts, Legrand Como, MD  albuterol (VENTOLIN HFA) 108 (90 Base) MCG/ACT inhaler Inhale 1-2 puffs into the lungs every 6 (six) hours as needed for wheezing or shortness of breath. 07/26/19   Martinique, Peter M, MD  Ascorbic Acid (VITAMIN C PO) Take 1 tablet by mouth daily.    [provider]  aspirin EC 81 MG tablet Take 1 tablet (81 mg total) by mouth daily. 08/03/15   Dorena Dew, FNP  atorvastatin (LIPITOR) 40 MG tablet Take 1 tablet (40 mg total) by mouth daily at 6 PM. 04/13/19   Duke, Tami Lin, PA  docusate sodium (COLACE) 100 MG capsule Take 100 mg by mouth daily.    [provider]  ferrous sulfate 325 (65 FE) MG tablet Take 325 mg by mouth daily with breakfast.    [provider]  fish oil-omega-3 fatty acids 1000 MG capsule Take 1 g by mouth daily.     [provider]  fluticasone (VERAMYST) 27.5 MCG/SPRAY nasal spray Place 2 sprays into the nose daily.    [provider]  hydroxypropyl methylcellulose / hypromellose (ISOPTO TEARS / GONIOVISC) 2.5 % ophthalmic solution Place 1 drop into both eyes daily as needed for dry eyes.    [provider]  lidocaine (XYLOCAINE) 5 % ointment Apply 1 application topically 4 (four) times daily as needed. 11/02/18   Long, Wonda Olds, MD  losartan (COZAAR) 100 MG tablet Take 1 tablet (100 mg total) by mouth daily. 10/18/19 10/12/20  Martinique, Peter M, MD  Multiple Vitamin (MULTIVITAMIN WITH MINERALS) TABS tablet Take 1 tablet by mouth daily.    [provider]  nitroGLYCERIN (NITROSTAT) 0.4 MG SL tablet Place 1 tablet (0.4 mg total) under the tongue every 5 (five) minutes as needed for chest pain. 08/02/19 04/06/20  Darreld Mclean, PA-C  omeprazole (PRILOSEC) 20 MG capsule Take 1 capsule (20 mg total) by mouth daily. 04/05/20 03/31/21  Vevelyn Francois, NP  sildenafil (REVATIO) 20 MG tablet Take 1 tablet (20 mg total) by mouth daily as needed. 04/05/20   Vevelyn Francois, NP  tamsulosin (FLOMAX) 0.4 MG CAPS capsule Take 1 capsule (0.4 mg total) by mouth daily. 04/05/20 04/05/21  Vevelyn Francois, NP    Allergies    Brilinta [ticagrelor] and Lisinopril  Review of Systems   Review of Systems  Constitutional: Negative for chills and fever.  HENT: Negative.   Respiratory:  Positive for cough. Negative for shortness of breath.   Cardiovascular: Negative.  Negative for chest pain.  Gastrointestinal: Positive for diarrhea, nausea and vomiting. Negative for abdominal pain and blood in stool.  Genitourinary: Negative.   Musculoskeletal: Negative.  Negative for myalgias.  Skin: Negative.   Neurological: Negative.  Negative for weakness and light-headedness.    Physical Exam Updated Vital Signs BP (!) 163/86   Pulse 79   Temp 97.7 F (36.5 C) (Oral)   Resp 18   SpO2 98%   Physical Exam Vitals and nursing note  reviewed.  Constitutional:      Appearance: He is well-developed and well-nourished.  HENT:     Head: Normocephalic.     Mouth/Throat:     Mouth: Mucous membranes are moist.  Cardiovascular:     Rate and Rhythm: Normal rate.  Pulmonary:     Effort: Pulmonary effort is normal.  Abdominal:     General: Bowel sounds are normal. There is no distension.     Palpations: Abdomen is soft.     Tenderness: There is no abdominal tenderness. There is no guarding or rebound.  Musculoskeletal:        General: Normal range of motion.     Cervical back: Normal range of motion and neck supple.  Skin:    General: Skin is warm and dry.     Findings: No rash.  Neurological:     Mental Status: He is alert and oriented to person, place, and time.  Psychiatric:        Mood and Affect: Mood and affect normal.     ED Results / Procedures / Treatments   Labs (all labs ordered are listed, but only abnormal results are displayed) Labs Reviewed  COMPREHENSIVE METABOLIC PANEL - Abnormal; Notable for the following components:      Result Value   CO2 19 (*)    Glucose, Bld 113 (*)    BUN 30 (*)    Creatinine, Ser 1.41 (*)    GFR, Estimated 52 (*)    All other components within normal limits  URINALYSIS, ROUTINE W REFLEX MICROSCOPIC - Abnormal; Notable for the following components:   Hgb urine dipstick SMALL (*)    All other components within normal limits   LIPASE, BLOOD  CBC    EKG None  Radiology No results found.  Procedures Procedures   Medications Ordered in ED Medications  ondansetron (ZOFRAN-ODT) disintegrating tablet 4 mg (has no administration in time range)    ED Course  I have reviewed the triage vital signs and the nursing notes.  Pertinent labs & imaging results that were available during my care of the patient were reviewed by me and considered in my medical decision making (see chart for details).    MDM Rules/Calculators/A&P                          Patient to ED with persistent nausea after vomiting/diarrheal illness 2 days ago. No pain or fever.   The patient is very well appearing. VSS, mild HTN. Zofran provided for nausea. Will follow with PO challenge. No abdominal pain, no previous surgeries, no fever, resolving symptoms - doubt obstruction, bacterial infection.   zofran provided relief of nausea. He is drinking without recurrent symptoms. He states he is ready to go home. Will provide Rx zofran.   The patient reports a cough for the past month. No fever. Dry cough. Doubt PNA. Will recommend tessalon for symptomatic control and PCP follow up.  Final Clinical Impression(s) / ED Diagnoses Final diagnoses:  None   1. Nausea, resolved 2. Persistent cough  Rx / DC Orders ED Discharge Orders    None       Dennie Bible 07/03/20 2133    Daleen Bo, MD 07/04/20 [phone]

## 2020-07-03 NOTE — ED Triage Notes (Signed)
Pt arrived by [facility] from home. Reports having nausea since eating chinese food 3 days ago. No vomiting since last night. Denies diarrhea.

## 2020-07-13 ENCOUNTER — Ambulatory Visit (INDEPENDENT_AMBULATORY_CARE_PROVIDER_SITE_OTHER): Payer: Medicare Other | Admitting: Nurse Practitioner

## 2020-07-13 ENCOUNTER — Other Ambulatory Visit: Payer: Self-pay

## 2020-07-13 ENCOUNTER — Inpatient Hospital Stay: Payer: Medicare Other | Admitting: Nurse Practitioner

## 2020-07-13 ENCOUNTER — Encounter: Payer: Self-pay | Admitting: Nurse Practitioner

## 2020-07-13 VITALS — BP 138/82 | HR 75 | Ht 74.0 in | Wt 186.0 lb

## 2020-07-13 DIAGNOSIS — R3911 Hesitancy of micturition: Secondary | ICD-10-CM | POA: Diagnosis not present

## 2020-07-13 DIAGNOSIS — I10 Essential (primary) hypertension: Secondary | ICD-10-CM | POA: Diagnosis not present

## 2020-07-13 DIAGNOSIS — N401 Enlarged prostate with lower urinary tract symptoms: Secondary | ICD-10-CM

## 2020-07-13 DIAGNOSIS — N529 Male erectile dysfunction, unspecified: Secondary | ICD-10-CM

## 2020-07-13 MED ORDER — SILDENAFIL CITRATE 100 MG PO TABS: 50.0000 mg | ORAL_TABLET | Freq: Every day | ORAL | 0 refills | Status: AC | PRN

## 2020-07-13 MED ORDER — FINASTERIDE 5 MG PO TABS: 5.0000 mg | ORAL_TABLET | Freq: Every day | ORAL | 11 refills | Status: AC

## 2020-07-13 MED ORDER — SILDENAFIL CITRATE 25 MG PO TABS: 25.0000 mg | ORAL_TABLET | Freq: Every day | ORAL | 0 refills | Status: DC | PRN

## 2020-07-13 NOTE — Progress Notes (Signed)
[place], [place]  [address] Phone:  [phone]   Fax:  [phone]   Established Patient Office Visit  Subjective:  Patient ID: Steven Lowery, male    DOB: July 23, 1945  Age: 75 y.o. MRN: [id number]  CC:  Chief Complaint  Patient presents with  . Hospitalization Follow-up    HPI Steven Lowery presents for follow up. He  has a past medical history of Acute asthmatic bronchitis, Alcohol abuse, Back pain, Benign prostatic hypertrophy, Cigarette smoker, Coronary artery disease, DJD (degenerative joint disease), Hypertension, IBS (irritable bowel syndrome), Myocardial infarction [facility]), Neck pain, and Psychiatric disorder.   Patient is in today for ER follow-up.for nausea, vomiting and diarrhea was diagnosed with food poising. ED course of treatment was  On 07/03/20. He admits that he continues to slowly gain his strength. He continues to use Zofran as needed. He is slowly introducing food into his diet.  He was also discharged on benzonatate for his cough. He got married about 3 weeks ago to his long time friend. He admits that his marriage is going well. He is excited to be able to do things a couple. He is concern about ability ot perform sexually. He has used Sildenafil 25 mg in the past but does not feel like this is effective and would like to increases the dose. Requesting Viagra 100 mg.  He is complaining that the tamolsin has been effective for frequency however he continues to have hestitany and a decreased stream. He is voiding every hour. He admits to nocturia 2-3 times per night. He denies any other symptoms.   Past Medical History:  Diagnosis Date  . Acute asthmatic bronchitis   . Alcohol abuse   . Back pain   . Benign prostatic hypertrophy   . Cigarette smoker   . Coronary artery disease   . DJD (degenerative joint disease)   . Hypertension   . IBS (irritable bowel syndrome)   . Myocardial infarction [facility])    per pt he  thinks he had an MI "5 years ago"  . Neck pain   . Psychiatric disorder     Past Surgical History:  Procedure Laterality Date  . CARDIAC CATHETERIZATION    . CORONARY STENT INTERVENTION N/A 04/12/2019   Procedure: CORONARY STENT INTERVENTION;  Surgeon: Lorretta Harp, MD;  Location: [place] CV LAB;  Service: Cardiovascular;  Laterality: N/A;  . CYSTOSCOPY  1999   Dr. Janice Norrie  . HAND SURGERY    . LEFT HEART CATH AND CORONARY ANGIOGRAPHY N/A 04/12/2019   Procedure: LEFT HEART CATH AND CORONARY ANGIOGRAPHY;  Surgeon: Lorretta Harp, MD;  Location: [place] CV LAB;  Service: Cardiovascular;  Laterality: N/A;  . TONSILLECTOMY      Family History  Problem Relation Age of Onset  . Breast cancer Sister   . Heart disease Sister   . Heart attack Father   . Heart disease Brother   . Colon cancer Neg Hx   . Esophageal cancer Neg Hx   . Rectal cancer Neg Hx   . Stomach cancer Neg Hx     Social History   Socioeconomic History  . Marital status: Married    Spouse name: Not on file  . Number of children: Not on file  . Years of education: 3  . Highest education level: 11th grade  Occupational History  . Occupation: Retired  Tobacco Use  . Smoking status: Former Smoker    Packs/day:  0.30    Years: 40.00    Pack years: 12.00    Types: Cigarettes    Quit date: 05/24/2019    Years since quitting: 1.1  . Smokeless tobacco: Never Used  . Tobacco comment: has cut back alot per pt  Substance and Sexual Activity  . Alcohol use: Yes    Alcohol/week: 6.0 standard drinks    Types: 6 Shots of liquor per week    Comment: quit 3 months ago  . Drug use: No  . Sexual activity: Yes  Other Topics Concern  . Not on file  Social History Narrative  . Not on file   Social Determinants of Health   Financial Resource Strain: Not on file  Food Insecurity: Not on file  Transportation Needs: Not on file  Physical Activity: Not on file  Stress: Not on file  Social Connections: Not on  file  Intimate Partner Violence: Not on file    Outpatient Medications Prior to Visit  Medication Sig Dispense Refill  . albuterol (VENTOLIN HFA) 108 (90 Base) MCG/ACT inhaler Inhale 1-2 puffs into the lungs every 6 (six) hours as needed for wheezing or shortness of breath. 1 g 3  . Ascorbic Acid (VITAMIN C PO) Take 1 tablet by mouth daily.    [name] aspirin EC 81 MG tablet Take 1 tablet (81 mg total) by mouth daily. 90 tablet 3  . atorvastatin (LIPITOR) 40 MG tablet Take 1 tablet (40 mg total) by mouth daily at 6 PM. 90 tablet 3  . benzonatate (TESSALON) 100 MG capsule Take 1 capsule (100 mg total) by mouth every 8 (eight) hours. 21 capsule 0  . docusate sodium (COLACE) 100 MG capsule Take 100 mg by mouth daily.    . ferrous sulfate 325 (65 FE) MG tablet Take 325 mg by mouth daily with breakfast.    . fish oil-omega-3 fatty acids 1000 MG capsule Take 1 g by mouth daily.    . fluticasone (VERAMYST) 27.5 MCG/SPRAY nasal spray Place 2 sprays into the nose daily.    . hydroxypropyl methylcellulose / hypromellose (ISOPTO TEARS / GONIOVISC) 2.5 % ophthalmic solution Place 1 drop into both eyes daily as needed for dry eyes.    [name] lidocaine (XYLOCAINE) 5 % ointment Apply 1 application topically 4 (four) times daily as needed. 35.44 g 0  . losartan (COZAAR) 100 MG tablet Take 1 tablet (100 mg total) by mouth daily. 90 tablet 3  . Multiple Vitamin (MULTIVITAMIN WITH MINERALS) TABS tablet Take 1 tablet by mouth daily.    [name] omeprazole (PRILOSEC) 20 MG capsule Take 1 capsule (20 mg total) by mouth daily. 90 capsule 3  . ondansetron (ZOFRAN ODT) 4 MG disintegrating tablet Take 1 tablet (4 mg total) by mouth every 8 (eight) hours as needed for nausea or vomiting. 20 tablet 0  . tamsulosin (FLOMAX) 0.4 MG CAPS capsule Take 1 capsule (0.4 mg total) by mouth daily. 90 capsule 3  . acetaminophen-codeine (TYLENOL #3) 300-30 MG tablet Take 1 tablet by mouth 2 (two) times daily as needed for moderate pain. 60 tablet 0   . sildenafil (REVATIO) 20 MG tablet Take 1 tablet (20 mg total) by mouth daily as needed. 20 tablet 0  . nitroGLYCERIN (NITROSTAT) 0.4 MG SL tablet Place 1 tablet (0.4 mg total) under the tongue every 5 (five) minutes as needed for chest pain. 15 tablet 25   Facility-Administered Medications Prior to Visit  Medication Dose Route Frequency Provider Last Rate Last Admin  . 0.9 %  sodium chloride infusion  500 mL Intravenous Continuous Ladene Artist, MD        Allergies  Allergen Reactions  . Brilinta [Ticagrelor] Shortness Of Breath  . Lisinopril Cough    ROS Review of Systems  Respiratory: Positive for cough.   Genitourinary: Positive for frequency and urgency. Negative for hematuria and penile pain.       Decreased stream Hesitancy Nocturia 2-3 times       Objective:    Physical Exam HENT:     Head: Normocephalic and atraumatic.     Nose: Nose normal.     Mouth/Throat:     Mouth: Mucous membranes are moist.  Cardiovascular:     Rate and Rhythm: Normal rate and regular rhythm.     Pulses: Normal pulses.     Heart sounds: Normal heart sounds.  Pulmonary:     Effort: Pulmonary effort is normal.     Breath sounds: Normal breath sounds.  Musculoskeletal:     Cervical back: Normal range of motion.  Skin:    General: Skin is warm.     Capillary Refill: Capillary refill takes less than 2 seconds.  Neurological:     General: No focal deficit present.     Mental Status: He is alert and oriented to person, place, and time.  Psychiatric:        Mood and Affect: Mood normal.        Behavior: Behavior normal.        Thought Content: Thought content normal.        Judgment: Judgment normal.     BP 138/82   Pulse 75   Ht 6\' 2"  (1.88 m)   Wt 186 lb (84.4 kg)   SpO2 100%   BMI 23.88 kg/m  Wt Readings from Last 3 Encounters:  07/13/20 186 lb (84.4 kg)  04/06/20 195 lb (88.5 kg)  04/05/20 195 lb 9.6 oz (88.7 kg)     Health Maintenance Due  Topic Date Due  .  COVID-19 Vaccine (3 - Booster for Pfizer series) 02/03/2020    There are no preventive care reminders to display for this patient.  Lab Results  Component Value Date   TSH 0.946 04/17/2015   Lab Results  Component Value Date   WBC 4.2 07/03/2020   HGB 16.8 07/03/2020   HCT 50.5 07/03/2020   MCV 91.8 07/03/2020   PLT 255 07/03/2020   Lab Results  Component Value Date   NA 136 07/03/2020   K 4.2 07/03/2020   CO2 19 (L) 07/03/2020   GLUCOSE 113 (H) 07/03/2020   BUN 30 (H) 07/03/2020   CREATININE 1.41 (H) 07/03/2020   BILITOT 0.7 07/03/2020   ALKPHOS 70 07/03/2020   AST 26 07/03/2020   ALT 23 07/03/2020   PROT 7.8 07/03/2020   ALBUMIN 4.1 07/03/2020   CALCIUM 9.9 07/03/2020   ANIONGAP 14 07/03/2020   GFR 84.97 10/27/2012   Lab Results  Component Value Date   CHOL 160 08/20/2019   Lab Results  Component Value Date   HDL 76 08/20/2019   Lab Results  Component Value Date   LDLCALC 72 08/20/2019   Lab Results  Component Value Date   TRIG 57 08/20/2019   Lab Results  Component Value Date   CHOLHDL 2.1 08/20/2019   Lab Results  Component Value Date   HGBA1C 5.9 (H) 04/11/2019      Assessment & Plan:   Problem List Items Addressed This Visit  Cardiovascular and Mediastinum   Essential hypertension Stable Encouraged on going compliance with current medication regimen Encouraged home monitoring and recording BP <130/80 Eating a heart-healthy diet with less salt Encouraged regular physical activity    Relevant Medications   sildenafil (VIAGRA) 100 MG tablet     Genitourinary   BPH (benign prostatic hyperplasia) - Primary Worsening Declined referral to urology Trial Finasteride 5 mg along with current tamsulosin    Relevant Medications   finasteride (PROSCAR) 5 MG tablet    Other Visit Diagnoses    Erectile dysfunction, unspecified erectile dysfunction type   Persistent current dose of Sildenafil not effecitve Increased Sildenafil 100 mg # 5       Meds ordered this encounter  Medications  . DISCONTD: sildenafil (VIAGRA) 25 MG tablet    Sig: Take 1 tablet (25 mg total) by mouth daily as needed for erectile dysfunction.    Dispense:  10 tablet    Refill:  0    Order Specific Question:   Supervising Provider    Answer:   Tresa Garter [id number]  . finasteride (PROSCAR) 5 MG tablet    Sig: Take 1 tablet (5 mg total) by mouth daily.    Dispense:  30 tablet    Refill:  11    Order Specific Question:   Supervising Provider    Answer:   Tresa Garter [id number]  . sildenafil (VIAGRA) 100 MG tablet    Sig: Take 0.5-1 tablets (50-100 mg total) by mouth daily as needed for erectile dysfunction.    Dispense:  5 tablet    Refill:  0    Order Specific Question:   Supervising Provider    Answer:   Tresa Garter [id number]    Follow-up: No follow-ups on file.    Vevelyn Francois, NP

## 2020-07-14 ENCOUNTER — Encounter: Payer: Self-pay | Admitting: Nurse Practitioner

## 2020-09-29 ENCOUNTER — Other Ambulatory Visit: Payer: Self-pay | Admitting: Family Medicine

## 2020-10-04 ENCOUNTER — Ambulatory Visit: Payer: Medicare Other | Admitting: Nurse Practitioner

## 2020-10-19 ENCOUNTER — Encounter: Payer: Self-pay | Admitting: Nurse Practitioner

## 2020-10-19 ENCOUNTER — Other Ambulatory Visit: Payer: Self-pay

## 2020-10-19 ENCOUNTER — Ambulatory Visit (INDEPENDENT_AMBULATORY_CARE_PROVIDER_SITE_OTHER): Payer: Medicare Other | Admitting: Nurse Practitioner

## 2020-10-19 VITALS — BP 135/75 | HR 62 | Temp 98.4°F | Ht 74.0 in | Wt 191.0 lb

## 2020-10-19 DIAGNOSIS — J439 Emphysema, unspecified: Secondary | ICD-10-CM

## 2020-10-19 DIAGNOSIS — K2100 Gastro-esophageal reflux disease with esophagitis, without bleeding: Secondary | ICD-10-CM | POA: Diagnosis not present

## 2020-10-19 DIAGNOSIS — E1169 Type 2 diabetes mellitus with other specified complication: Secondary | ICD-10-CM | POA: Diagnosis not present

## 2020-10-19 DIAGNOSIS — I10 Essential (primary) hypertension: Secondary | ICD-10-CM

## 2020-10-19 DIAGNOSIS — Z5320 Procedure and treatment not carried out because of patient's decision for unspecified reasons: Secondary | ICD-10-CM

## 2020-10-19 MED ORDER — OMEPRAZOLE 20 MG PO CPDR: 20.0000 mg | DELAYED_RELEASE_CAPSULE | Freq: Every day | ORAL | 3 refills | Status: AC

## 2020-10-19 NOTE — Patient Instructions (Signed)
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
## 2020-10-20 ENCOUNTER — Ambulatory Visit: Payer: Medicare Other | Admitting: Nurse Practitioner

## 2020-12-12 ENCOUNTER — Telehealth (HOSPITAL_COMMUNITY): Payer: Self-pay | Admitting: Nurse Practitioner

## 2020-12-12 NOTE — Telephone Encounter (Signed)
Pt came in to give Fax Number for paperwork brought on his last appt, 10/19/20  for Disability. Fax number is [phone]. Please advise and thank you

## 2021-01-02 ENCOUNTER — Ambulatory Visit: Payer: Medicare Other

## 2021-01-16 ENCOUNTER — Encounter: Payer: Self-pay | Admitting: Gastroenterology

## 2021-01-19 ENCOUNTER — Ambulatory Visit: Payer: Medicare Other | Admitting: Nurse Practitioner

## 2021-01-28 ENCOUNTER — Ambulatory Visit (INDEPENDENT_AMBULATORY_CARE_PROVIDER_SITE_OTHER): Payer: Medicare Other

## 2021-06-21 ENCOUNTER — Other Ambulatory Visit: Payer: Self-pay | Admitting: Nurse Practitioner

## 2021-11-21 ENCOUNTER — Emergency Department (HOSPITAL_COMMUNITY)
Admission: EM | Admit: 2021-11-21 | Discharge: 2021-11-22 | Disposition: A | Discharge status: Home / Self Care | Payer: Medicare Other | Attending: Emergency Medicine | Admitting: Emergency Medicine

## 2021-11-21 ENCOUNTER — Encounter (HOSPITAL_COMMUNITY): Payer: Self-pay

## 2021-11-21 ENCOUNTER — Emergency Department (HOSPITAL_COMMUNITY): Payer: Medicare Other

## 2021-11-21 ENCOUNTER — Other Ambulatory Visit: Payer: Self-pay

## 2021-11-21 DIAGNOSIS — E860 Dehydration: Secondary | ICD-10-CM | POA: Diagnosis not present

## 2021-11-21 DIAGNOSIS — Z7982 Long term (current) use of aspirin: Secondary | ICD-10-CM | POA: Insufficient documentation

## 2021-11-21 DIAGNOSIS — I2510 Atherosclerotic heart disease of native coronary artery without angina pectoris: Secondary | ICD-10-CM | POA: Diagnosis not present

## 2021-11-21 DIAGNOSIS — N179 Acute kidney failure, unspecified: Secondary | ICD-10-CM | POA: Insufficient documentation

## 2021-11-21 DIAGNOSIS — R55 Syncope and collapse: Secondary | ICD-10-CM | POA: Insufficient documentation

## 2021-11-21 MED ORDER — LACTATED RINGERS IV BOLUS
1000.0000 mL | Freq: Once | INTRAVENOUS | Status: AC
  Administered 2021-11-22: 1000 mL via INTRAVENOUS

## 2021-11-21 NOTE — ED Provider Notes (Signed)
[facility] EMERGENCY DEPARTMENT Provider Note   CSN: [id number] Arrival date & time: 11/21/21  2250     History  Chief Complaint  Patient presents with   Near Syncope    Steven Lowery is a 76 y.o. male.  76 year old male past medical history of coronary disease status post stent the presents the ER today with near syncopal episode.  Patient states he was working outside quite a bit today cooking food for his wife as well.  He had a couple beers after his work outside.  He states he ate and drink normally.  States he started feel a bit lightheaded when he sat down he felt like he was going to pass out and no associated palpitations, chest pain, abdominal pain, headache, vision changes or neurologic changes.  Patient states that he felt pretty bad at this about the time EMS got there.  Per nursing report EMS had initial blood pressure of 78/50.  They gave patient 250 cc of fluid and blood pressures improved.  Patient states he feels fantastic right now. He was on the couch when this happened, no traumatic injuries.   Near Syncope       Home Medications Prior to Admission medications   Medication Sig Start Date End Date Taking? Authorizing Provider  albuterol (VENTOLIN HFA) 108 (90 Base) MCG/ACT inhaler Inhale 1-2 puffs into the lungs every 6 (six) hours as needed for wheezing or shortness of breath. 07/26/19   Martinique, Peter M, MD  Ascorbic Acid (VITAMIN C PO) Take 1 tablet by mouth daily.    [provider]  aspirin EC 81 MG tablet Take 1 tablet (81 mg total) by mouth daily. 08/03/15   Dorena Dew, FNP  atorvastatin (LIPITOR) 40 MG tablet Take 1 tablet (40 mg total) by mouth daily at 6 PM. 04/13/19   Duke, Tami Lin, PA  benzonatate (TESSALON) 100 MG capsule Take 1 capsule (100 mg total) by mouth every 8 (eight) hours. 07/03/20   Charlann Lange, PA-C  docusate sodium (COLACE) 100 MG capsule Take 100 mg by mouth daily.    [provider]   ferrous sulfate 325 (65 FE) MG tablet Take 325 mg by mouth daily with breakfast.    [provider]  fish oil-omega-3 fatty acids 1000 MG capsule Take 1 g by mouth daily.    [provider]  fluticasone (VERAMYST) 27.5 MCG/SPRAY nasal spray Place 2 sprays into the nose daily.    [provider]  gabapentin (NEURONTIN) 100 MG capsule TAKE 1 CAPSULE BY MOUTH AT BEDTIME. MAY INCREASE TO 3 BY MOUTH AT BEDTIME AS NEEDED 09/29/20   Hilts, Legrand Como, MD  hydroxypropyl methylcellulose / hypromellose (ISOPTO TEARS / GONIOVISC) 2.5 % ophthalmic solution Place 1 drop into both eyes daily as needed for dry eyes.    [provider]  lidocaine (XYLOCAINE) 5 % ointment Apply 1 application topically 4 (four) times daily as needed. 11/02/18   Long, Wonda Olds, MD  losartan (COZAAR) 100 MG tablet Take 1 tablet (100 mg total) by mouth daily. 10/18/19 10/12/20  Martinique, Peter M, MD  Multiple Vitamin (MULTIVITAMIN WITH MINERALS) TABS tablet Take 1 tablet by mouth daily.    [provider]  nitroGLYCERIN (NITROSTAT) 0.4 MG SL tablet Place 1 tablet (0.4 mg total) under the tongue every 5 (five) minutes as needed for chest pain. 08/02/19 04/06/20  Darreld Mclean, PA-C  omeprazole (PRILOSEC) 20 MG capsule Take 1 capsule (20 mg total) by mouth daily.  10/19/20 10/14/21  Vevelyn Francois, NP  ondansetron (ZOFRAN ODT) 4 MG disintegrating tablet Take 1 tablet (4 mg total) by mouth every 8 (eight) hours as needed for nausea or vomiting. 07/03/20   Charlann Lange, PA-C  sildenafil (VIAGRA) 100 MG tablet Take 0.5-1 tablets (50-100 mg total) by mouth daily as needed for erectile dysfunction. 07/13/20   Vevelyn Francois, NP      Allergies    Brilinta [ticagrelor] and Lisinopril    Review of Systems   Review of Systems  Cardiovascular:  Positive for near-syncope.    Physical Exam Updated Vital Signs BP 110/68 (BP Location: Left Arm)   Pulse 75   Temp 98.3 F (36.8 C) (Oral)   Resp 18   Ht  '6\' 2"'$  (1.88 m)   Wt 85.7 kg   SpO2 98%   BMI 24.27 kg/m  Physical Exam Vitals and nursing note reviewed.  Constitutional:      Appearance: He is well-developed.  HENT:     Head: Normocephalic and atraumatic.     Nose: No congestion or rhinorrhea.     Mouth/Throat:     Mouth: Mucous membranes are moist.     Pharynx: Oropharynx is clear.  Cardiovascular:     Rate and Rhythm: Normal rate.     Heart sounds: No murmur heard.    No friction rub.  Pulmonary:     Effort: Pulmonary effort is normal. No respiratory distress.  Abdominal:     General: Abdomen is flat. There is no distension.  Musculoskeletal:        General: Normal range of motion.     Cervical back: Normal range of motion.  Neurological:     Mental Status: He is alert.     Comments: No altered mental status, able to give full seemingly accurate history.  Face is symmetric, EOM's intact, pupils equal and reactive, vision intact, tongue and uvula midline without deviation. Upper and Lower extremity motor 5/5, intact pain perception in distal extremities, 2+ reflexes in biceps, patella and achilles tendons. Able to perform finger to nose normal with both hands. Walks without assistance or evident ataxia.       ED Results / Procedures / Treatments   Labs (all labs ordered are listed, but only abnormal results are displayed) Labs Reviewed  CBC WITH DIFFERENTIAL/PLATELET - Abnormal; Notable for the following components:      Result Value   RBC 4.04 (*)    Hemoglobin 12.3 (*)    HCT 35.6 (*)    All other components within normal limits  COMPREHENSIVE METABOLIC PANEL - Abnormal; Notable for the following components:   Glucose, Bld 122 (*)    Creatinine, Ser 2.47 (*)    GFR, Estimated 26 (*)    All other components within normal limits  CBG MONITORING, ED  TROPONIN I (HIGH SENSITIVITY)  TROPONIN I (HIGH SENSITIVITY)    EKG None  Radiology DG Chest 1 View  Result Date: 11/21/2021 CLINICAL DATA:  Weakness.   Near syncope. EXAM: CHEST  1 VIEW COMPARISON:  06/22/2019 FINDINGS: Lung volumes are low. There are streaky opacities at the lung bases typical of atelectasis or scarring, right greater than left. Paucity of lung markings suggests underlying emphysema. The heart is normal in size. Stable mediastinal contours. No pneumothorax, pulmonary edema, or pleural effusion. IMPRESSION: Low lung volumes with bibasilar atelectasis or scarring, right greater than left. Electronically Signed   By: Keith Rake M.D.   On: 11/21/2021 23:36    Procedures  Procedures    Medications Ordered in ED Medications  lactated ringers bolus 1,000 mL (0 mLs Intravenous Stopped 11/22/21 0206)    ED Course/ Medical Decision Making/ A&P                           Medical Decision Making Amount and/or Complexity of Data Reviewed Labs: ordered. Radiology: ordered. ECG/medicine tests: ordered.  Working outside, alcohol and his initial blood pressure all suggest likely hypovolemia.  We will start with some fluids.  Does have a coronary artery history so we will check enzymes along with an EKG along with basic lab work.  Suspect patient likely able to go home as he feels good now and his vital signs are normal.  Low suspicion for CVA or arrhythmias without any associated symptoms. Delta troponin is negative.  EKG okay.  Blood pressures consistently within normal limits.  Has ambulated multiple times in the ER without any lightheadedness or near syncopal episodes.  Does have a mild AKI fluids were given here and in the ambulance.  We will follow this up with his primary doctor about a week get it rechecked.  We will decrease alcohol intake and increase water intake over the next few days as well.  Discussed reasons to return to the ED.   Final Clinical Impression(s) / ED Diagnoses Final diagnoses:  Near syncope  Dehydration  AKI (acute kidney injury) [facility])    Rx / DC Orders ED Discharge Orders     None          Deago Burruss, Corene Cornea, MD 11/22/21 [phone]

## 2021-11-21 NOTE — ED Triage Notes (Signed)
Pt from home via [facility], wife reports pt was diaphoretic, light headedness, and feeling near syncopal, along with dizziness. EMS reports no radial pulse to right wrist initally, BP 78/50 on arrival, concern for BP meds needs adjustments. 250 ml NS in route. Pt with improvement on arrival, Pt ambulated to treatment RM from stretcher, GCS 15, VSS at current. Current BP 98/60.

## 2021-11-22 DIAGNOSIS — R55 Syncope and collapse: Secondary | ICD-10-CM | POA: Diagnosis not present

## 2021-11-22 LAB — CBC WITH DIFFERENTIAL/PLATELET
Abs Immature Granulocytes: 0.03 10*3/uL (ref 0.00–0.07)
Basophils Absolute: 0 10*3/uL (ref 0.0–0.1)
Basophils Relative: 0 %
Eosinophils Absolute: 0.1 10*3/uL (ref 0.0–0.5)
Eosinophils Relative: 1 %
HCT: 35.6 % — ABNORMAL LOW (ref 39.0–52.0)
Hemoglobin: 12.3 g/dL — ABNORMAL LOW (ref 13.0–17.0)
Immature Granulocytes: 0 %
Lymphocytes Relative: 15 %
Lymphs Abs: 1.4 10*3/uL (ref 0.7–4.0)
MCH: 30.4 pg (ref 26.0–34.0)
MCHC: 34.6 g/dL (ref 30.0–36.0)
MCV: 88.1 fL (ref 80.0–100.0)
Monocytes Absolute: 0.6 10*3/uL (ref 0.1–1.0)
Monocytes Relative: 6 %
Neutro Abs: 7.2 10*3/uL (ref 1.7–7.7)
Neutrophils Relative %: 78 %
Platelets: 231 10*3/uL (ref 150–400)
RBC: 4.04 MIL/uL — ABNORMAL LOW (ref 4.22–5.81)
RDW: 12.6 % (ref 11.5–15.5)
WBC: 9.3 10*3/uL (ref 4.0–10.5)
nRBC: 0 % (ref 0.0–0.2)

## 2021-11-22 LAB — COMPREHENSIVE METABOLIC PANEL
ALT: 20 U/L (ref 0–44)
AST: 24 U/L (ref 15–41)
Albumin: 3.8 g/dL (ref 3.5–5.0)
Alkaline Phosphatase: 69 U/L (ref 38–126)
Anion gap: 10 (ref 5–15)
BUN: 23 mg/dL (ref 8–23)
CO2: 24 mmol/L (ref 22–32)
Calcium: 9.7 mg/dL (ref 8.9–10.3)
Chloride: 102 mmol/L (ref 98–111)
Creatinine, Ser: 2.47 mg/dL — ABNORMAL HIGH (ref 0.61–1.24)
GFR, Estimated: 26 mL/min — ABNORMAL LOW (ref >60)
Glucose, Bld: 122 mg/dL — ABNORMAL HIGH (ref 70–99)
Potassium: 4.8 mmol/L (ref 3.5–5.1)
Sodium: 136 mmol/L (ref 135–145)
Total Bilirubin: 0.5 mg/dL (ref 0.3–1.2)
Total Protein: 6.8 g/dL (ref 6.5–8.1)

## 2021-11-22 LAB — TROPONIN I (HIGH SENSITIVITY)
Troponin I (High Sensitivity): 4 ng/L (ref <18)
Troponin I (High Sensitivity): 3 ng/L (ref <18)

## 2021-11-22 NOTE — ED Notes (Signed)
Pt was steady while ambulated

## 2021-12-11 ENCOUNTER — Other Ambulatory Visit (HOSPITAL_COMMUNITY): Payer: Self-pay

## 2021-12-11 MED ORDER — HYDROCODONE-ACETAMINOPHEN 7.5-325 MG PO TABS
1.0000 | ORAL_TABLET | ORAL | 0 refills | Status: AC | PRN
  Filled 2021-12-11: qty 180, 30d supply, fill #0

## 2021-12-11 MED ORDER — CYCLOBENZAPRINE HCL 5 MG PO TABS
5.0000 mg | ORAL_TABLET | Freq: Three times a day (TID) | ORAL | 0 refills | Status: AC | PRN
Start: 2021-12-11 — End: ?
  Filled 2021-12-11: qty 90, 30d supply, fill #0

## 2021-12-11 MED ORDER — GABAPENTIN 300 MG PO CAPS
300.0000 mg | ORAL_CAPSULE | Freq: Every day | ORAL | 0 refills | Status: AC
  Filled 2021-12-11: qty 30, 30d supply, fill #0

## 2022-05-07 ENCOUNTER — Other Ambulatory Visit (HOSPITAL_COMMUNITY): Payer: Self-pay

## 2022-05-07 MED ORDER — CYCLOBENZAPRINE HCL 5 MG PO TABS
5.0000 mg | ORAL_TABLET | Freq: Three times a day (TID) | ORAL | 0 refills | Status: DC | PRN
  Filled 2022-05-07: qty 90, 30d supply, fill #0

## 2022-05-07 MED ORDER — ERGOCALCIFEROL 1.25 MG (50000 UT) PO CAPS
50000.0000 [IU] | ORAL_CAPSULE | ORAL | 1 refills | Status: AC
  Filled 2022-05-07: qty 12, 84d supply, fill #0

## 2022-05-07 MED ORDER — GABAPENTIN 300 MG PO CAPS
300.0000 mg | ORAL_CAPSULE | Freq: Every day | ORAL | 0 refills | Status: DC
  Filled 2022-05-07: qty 30, 30d supply, fill #0

## 2022-05-07 MED ORDER — HYDROCODONE-ACETAMINOPHEN 10-325 MG PO TABS
1.0000 | ORAL_TABLET | Freq: Every day | ORAL | 0 refills | Status: DC
  Filled 2022-05-07: qty 150, 30d supply, fill #0

## 2022-06-06 ENCOUNTER — Other Ambulatory Visit (HOSPITAL_COMMUNITY): Payer: Self-pay

## 2022-06-06 MED ORDER — ERGOCALCIFEROL 1.25 MG (50000 UT) PO CAPS
50000.0000 [IU] | ORAL_CAPSULE | ORAL | 1 refills | Status: AC
Start: 2022-06-06 — End: ?
  Filled 2022-06-06: qty 12, 84d supply, fill #0

## 2022-06-06 MED ORDER — CYCLOBENZAPRINE HCL 5 MG PO TABS
5.0000 mg | ORAL_TABLET | Freq: Three times a day (TID) | ORAL | 0 refills | Status: DC | PRN
  Filled 2022-06-06: qty 90, 30d supply, fill #0

## 2022-06-06 MED ORDER — GABAPENTIN 300 MG PO CAPS
300.0000 mg | ORAL_CAPSULE | Freq: Every day | ORAL | 0 refills | Status: DC
  Filled 2022-06-06: qty 30, 30d supply, fill #0

## 2022-06-06 MED ORDER — HYDROCODONE-ACETAMINOPHEN 10-325 MG PO TABS
1.0000 | ORAL_TABLET | Freq: Every day | ORAL | 0 refills | Status: DC
  Filled 2022-06-06: qty 150, 30d supply, fill #0

## 2022-06-07 ENCOUNTER — Other Ambulatory Visit (HOSPITAL_COMMUNITY): Payer: Self-pay

## 2022-07-02 ENCOUNTER — Other Ambulatory Visit (HOSPITAL_COMMUNITY): Payer: Self-pay

## 2022-07-29 ENCOUNTER — Other Ambulatory Visit (HOSPITAL_COMMUNITY): Payer: Self-pay

## 2022-07-29 MED ORDER — MIRTAZAPINE 15 MG PO TABS
15.0000 mg | ORAL_TABLET | Freq: Every evening | ORAL | 0 refills | Status: DC
Start: 2022-07-27 — End: 2022-08-19
  Filled 2022-07-29: qty 30, 30d supply, fill #0

## 2022-07-30 ENCOUNTER — Other Ambulatory Visit (HOSPITAL_COMMUNITY): Payer: Self-pay

## 2022-07-31 ENCOUNTER — Other Ambulatory Visit (HOSPITAL_COMMUNITY): Payer: Self-pay

## 2022-07-31 MED ORDER — GABAPENTIN 300 MG PO CAPS
300.0000 mg | ORAL_CAPSULE | Freq: Every day | ORAL | 0 refills | Status: DC
  Filled 2022-07-31: qty 30, 30d supply, fill #0

## 2022-07-31 MED ORDER — CYCLOBENZAPRINE HCL 5 MG PO TABS
5.0000 mg | ORAL_TABLET | Freq: Three times a day (TID) | ORAL | 0 refills | Status: DC | PRN
  Filled 2022-07-31: qty 90, 30d supply, fill #0

## 2022-07-31 MED ORDER — HYDROCODONE-ACETAMINOPHEN 10-325 MG PO TABS
1.0000 | ORAL_TABLET | Freq: Every day | ORAL | 0 refills | Status: DC
  Filled 2022-08-05: qty 150, 30d supply, fill #0

## 2022-08-01 ENCOUNTER — Other Ambulatory Visit (HOSPITAL_COMMUNITY): Payer: Self-pay

## 2022-08-05 ENCOUNTER — Other Ambulatory Visit (HOSPITAL_COMMUNITY): Payer: Self-pay

## 2022-08-05 ENCOUNTER — Other Ambulatory Visit: Payer: Self-pay

## 2022-08-19 ENCOUNTER — Other Ambulatory Visit: Payer: Self-pay

## 2022-08-19 ENCOUNTER — Other Ambulatory Visit (HOSPITAL_COMMUNITY): Payer: Self-pay

## 2022-08-19 MED ORDER — MIRTAZAPINE 15 MG PO TABS
15.0000 mg | ORAL_TABLET | Freq: Every evening | ORAL | 2 refills | Status: AC
  Filled 2022-08-19 – 2022-08-28 (×2): qty 30, 30d supply, fill #0
  Filled ????-??-??: fill #0

## 2022-08-22 ENCOUNTER — Other Ambulatory Visit: Payer: Self-pay

## 2022-08-28 ENCOUNTER — Other Ambulatory Visit: Payer: Self-pay

## 2022-08-28 ENCOUNTER — Other Ambulatory Visit (HOSPITAL_COMMUNITY): Payer: Self-pay

## 2022-08-28 MED ORDER — CYCLOBENZAPRINE HCL 5 MG PO TABS
5.0000 mg | ORAL_TABLET | Freq: Three times a day (TID) | ORAL | 0 refills | Status: DC | PRN
  Filled 2022-08-28: qty 90, 30d supply, fill #0

## 2022-08-28 MED ORDER — GABAPENTIN 300 MG PO CAPS
300.0000 mg | ORAL_CAPSULE | Freq: Every day | ORAL | 0 refills | Status: DC
  Filled 2022-08-28: qty 30, 30d supply, fill #0

## 2022-08-28 MED ORDER — ERGOCALCIFEROL 1.25 MG (50000 UT) PO CAPS
50000.0000 [IU] | ORAL_CAPSULE | ORAL | 1 refills | Status: AC
  Filled 2022-08-28: qty 12, 84d supply, fill #0

## 2022-08-28 MED ORDER — HYDROCODONE-ACETAMINOPHEN 10-325 MG PO TABS
1.0000 | ORAL_TABLET | Freq: Every day | ORAL | 0 refills | Status: AC
  Filled 2022-08-28 – 2022-09-02 (×2): qty 150, 30d supply, fill #0

## 2022-09-02 ENCOUNTER — Other Ambulatory Visit (HOSPITAL_COMMUNITY): Payer: Self-pay

## 2022-09-27 ENCOUNTER — Other Ambulatory Visit (HOSPITAL_COMMUNITY): Payer: Self-pay

## 2022-09-27 MED ORDER — GABAPENTIN 300 MG PO CAPS
300.0000 mg | ORAL_CAPSULE | Freq: Every evening | ORAL | 0 refills | Status: DC
  Filled 2022-09-27: qty 30, 30d supply, fill #0

## 2022-09-27 MED ORDER — VITAMIN D (ERGOCALCIFEROL) 1.25 MG (50000 UNIT) PO CAPS
50000.0000 [IU] | ORAL_CAPSULE | ORAL | 1 refills | Status: AC
  Filled 2022-09-27: qty 12, 84d supply, fill #0

## 2022-09-27 MED ORDER — HYDROCODONE-ACETAMINOPHEN 10-325 MG PO TABS
1.0000 | ORAL_TABLET | Freq: Every day | ORAL | 0 refills | Status: AC | PRN
  Filled 2022-10-01: qty 150, 30d supply, fill #0

## 2022-09-27 MED ORDER — CYCLOBENZAPRINE HCL 5 MG PO TABS
5.0000 mg | ORAL_TABLET | Freq: Three times a day (TID) | ORAL | 0 refills | Status: AC | PRN
  Filled 2022-09-27: qty 90, 30d supply, fill #0

## 2022-10-01 ENCOUNTER — Other Ambulatory Visit (HOSPITAL_COMMUNITY): Payer: Self-pay

## 2022-10-10 ENCOUNTER — Other Ambulatory Visit (HOSPITAL_COMMUNITY): Payer: Self-pay

## 2022-10-10 MED ORDER — ATORVASTATIN CALCIUM 40 MG PO TABS
40.0000 mg | ORAL_TABLET | Freq: Every day | ORAL | 1 refills | Status: AC
  Filled 2022-10-10: qty 90, 90d supply, fill #0
  Filled 2023-02-05 – 2023-02-25 (×2): qty 90, 90d supply, fill #1

## 2022-10-10 MED ORDER — TAMSULOSIN HCL 0.4 MG PO CAPS
0.4000 mg | ORAL_CAPSULE | Freq: Every day | ORAL | 0 refills | Status: AC
  Filled 2022-10-10: qty 90, 90d supply, fill #0

## 2022-10-11 ENCOUNTER — Other Ambulatory Visit (HOSPITAL_COMMUNITY): Payer: Self-pay

## 2022-10-15 ENCOUNTER — Other Ambulatory Visit (HOSPITAL_COMMUNITY): Payer: Self-pay

## 2022-10-17 ENCOUNTER — Other Ambulatory Visit (HOSPITAL_COMMUNITY): Payer: Self-pay

## 2022-10-17 MED ORDER — LOSARTAN POTASSIUM 25 MG PO TABS
25.0000 mg | ORAL_TABLET | Freq: Every day | ORAL | 0 refills | Status: DC
  Filled 2022-10-17: qty 90, 90d supply, fill #0

## 2022-10-28 ENCOUNTER — Other Ambulatory Visit (HOSPITAL_COMMUNITY): Payer: Self-pay

## 2022-10-28 MED ORDER — CYCLOBENZAPRINE HCL 5 MG PO TABS
5.0000 mg | ORAL_TABLET | Freq: Three times a day (TID) | ORAL | 0 refills | Status: AC
  Filled 2022-10-28 – 2022-10-29 (×2): qty 90, 30d supply, fill #0

## 2022-10-28 MED ORDER — HYDROCODONE-ACETAMINOPHEN 10-325 MG PO TABS
1.0000 | ORAL_TABLET | Freq: Every day | ORAL | 0 refills | Status: AC
  Filled 2022-10-28 – 2022-10-30 (×4): qty 150, 30d supply, fill #0

## 2022-10-28 MED ORDER — GABAPENTIN 300 MG PO CAPS
300.0000 mg | ORAL_CAPSULE | Freq: Every evening | ORAL | 0 refills | Status: DC
  Filled 2022-10-28 – 2022-10-29 (×2): qty 30, 30d supply, fill #0

## 2022-10-29 ENCOUNTER — Other Ambulatory Visit (HOSPITAL_COMMUNITY): Payer: Self-pay

## 2022-10-29 ENCOUNTER — Other Ambulatory Visit: Payer: Self-pay

## 2022-10-30 ENCOUNTER — Other Ambulatory Visit (HOSPITAL_COMMUNITY): Payer: Self-pay

## 2022-11-17 ENCOUNTER — Encounter (HOSPITAL_COMMUNITY): Payer: Self-pay

## 2022-11-17 ENCOUNTER — Emergency Department (HOSPITAL_COMMUNITY)
Admission: EM | Admit: 2022-11-17 | Discharge: 2022-11-17 | Disposition: A | Discharge status: Home / Self Care | Payer: 59 | Attending: Emergency Medicine | Admitting: Emergency Medicine

## 2022-11-17 ENCOUNTER — Emergency Department (HOSPITAL_COMMUNITY): Payer: 59

## 2022-11-17 ENCOUNTER — Other Ambulatory Visit: Payer: Self-pay

## 2022-11-17 DIAGNOSIS — I10 Essential (primary) hypertension: Secondary | ICD-10-CM | POA: Insufficient documentation

## 2022-11-17 DIAGNOSIS — I2510 Atherosclerotic heart disease of native coronary artery without angina pectoris: Secondary | ICD-10-CM | POA: Insufficient documentation

## 2022-11-17 DIAGNOSIS — J45909 Unspecified asthma, uncomplicated: Secondary | ICD-10-CM | POA: Insufficient documentation

## 2022-11-17 DIAGNOSIS — E860 Dehydration: Secondary | ICD-10-CM | POA: Diagnosis not present

## 2022-11-17 DIAGNOSIS — F17210 Nicotine dependence, cigarettes, uncomplicated: Secondary | ICD-10-CM | POA: Diagnosis not present

## 2022-11-17 DIAGNOSIS — Z955 Presence of coronary angioplasty implant and graft: Secondary | ICD-10-CM | POA: Diagnosis not present

## 2022-11-17 DIAGNOSIS — R55 Syncope and collapse: Secondary | ICD-10-CM | POA: Insufficient documentation

## 2022-11-17 LAB — CBC WITH DIFFERENTIAL/PLATELET
Abs Immature Granulocytes: 0.01 10*3/uL (ref 0.00–0.07)
Basophils Absolute: 0 10*3/uL (ref 0.0–0.1)
Basophils Relative: 1 %
Eosinophils Absolute: 0.2 10*3/uL (ref 0.0–0.5)
Eosinophils Relative: 3 %
HCT: 36.9 % — ABNORMAL LOW (ref 39.0–52.0)
Hemoglobin: 12 g/dL — ABNORMAL LOW (ref 13.0–17.0)
Immature Granulocytes: 0 %
Lymphocytes Relative: 36 %
Lymphs Abs: 2 10*3/uL (ref 0.7–4.0)
MCH: 29.4 pg (ref 26.0–34.0)
MCHC: 32.5 g/dL (ref 30.0–36.0)
MCV: 90.4 fL (ref 80.0–100.0)
Monocytes Absolute: 0.5 10*3/uL (ref 0.1–1.0)
Monocytes Relative: 9 %
Neutro Abs: 2.8 10*3/uL (ref 1.7–7.7)
Neutrophils Relative %: 51 %
Platelets: 232 10*3/uL (ref 150–400)
RBC: 4.08 MIL/uL — ABNORMAL LOW (ref 4.22–5.81)
RDW: 13.9 % (ref 11.5–15.5)
WBC: 5.5 10*3/uL (ref 4.0–10.5)
nRBC: 0 % (ref 0.0–0.2)

## 2022-11-17 LAB — COMPREHENSIVE METABOLIC PANEL
ALT: 16 U/L (ref 0–44)
AST: 19 U/L (ref 15–41)
Albumin: 3.5 g/dL (ref 3.5–5.0)
Alkaline Phosphatase: 67 U/L (ref 38–126)
Anion gap: 9 (ref 5–15)
BUN: 12 mg/dL (ref 8–23)
CO2: 23 mmol/L (ref 22–32)
Calcium: 8.9 mg/dL (ref 8.9–10.3)
Chloride: 106 mmol/L (ref 98–111)
Creatinine, Ser: 1.54 mg/dL — ABNORMAL HIGH (ref 0.61–1.24)
GFR, Estimated: 46 mL/min — ABNORMAL LOW (ref >60)
Glucose, Bld: 84 mg/dL (ref 70–99)
Potassium: 4.3 mmol/L (ref 3.5–5.1)
Sodium: 138 mmol/L (ref 135–145)
Total Bilirubin: 0.5 mg/dL (ref 0.3–1.2)
Total Protein: 6.5 g/dL (ref 6.5–8.1)

## 2022-11-17 LAB — TROPONIN I (HIGH SENSITIVITY)
Troponin I (High Sensitivity): 4 ng/L (ref <18)
Troponin I (High Sensitivity): 4 ng/L (ref <18)

## 2022-11-17 MED ORDER — SODIUM CHLORIDE 0.9 % IV BOLUS
1000.0000 mL | Freq: Once | INTRAVENOUS | Status: AC
  Administered 2022-11-17: 1000 mL via INTRAVENOUS

## 2022-11-17 NOTE — ED Provider Notes (Signed)
[place] EMERGENCY DEPARTMENT AT [facility] Provider Note  MDM   HPI/ROS:  Steven Lowery is a 77 y.o. male with a medical history as below who presents for evaluation of syncopal episode.  He has a history of CAD with stent placement.  Reports he was at his dinner table after he had some alcohol and started feeling very sweaty.  He became unresponsive and urinated on himself.  EMS was called.  He was noted to be hypotensive and received 500 cc bolus.  He did not hit his head & did not fall to the ground.  Chart review shows he had a similar presentation approximately 1 year ago.  Physical exam is notable for: - Clear and equal bilateral breath sounds - No abdominal tenderness or distention   On my initial evaluation, patient is:  -Vital signs stable. Patient afebrile, hemodynamically stable, and non-toxic appearing. -Additional history obtained from EMS, chart review  This patient's current presentation, including their history and physical exam, is most consistent with syncope most likely due to dehydration.  Differential diagnosis includes reflex syncope, orthostatic syncope secondary to medication.  There is no evidence of acute, without any hemorrhage.  Presentation not consistent with seizures given short time course and no postictal state or reported seizure activity.  Low suspicion for acute neurologic catastrophe including ICH given lack of trauma.  Presentation not consistent with vascular catastrophe to include PE, thoracic aortic dissection, AAA or rupture.    Will plan to continue fluid resuscitation, pursue a ACS rule out with EKG, BMP and CBC.  Serial reevaluation.  Interpretations, interventions, and the patient's course of care are documented below.    Clinical Course as of 11/17/22 2340  Sun Nov 17, 2022  2124 Troponin I (High Sensitivity): 4 Second troponin is flat. [BB]  2124 Long discussion with the patient and engaged in shared decision-making around  his disposition. I believed the patient would benefit from continued workup with inpatient stay since he presents with high-risk syncope. The patient reports he would like to go home and he has the ability to have close follow up with a provider at the [place] where he gets a majority of his care.   Ultimately, I believe he is capable of close follow up and has good social support at home. We discussed strict return precautions and he verbalized understanding.  [BB]    Clinical Course User Index [BB] Fayrene Helper, MD   Troponins remained flat, and   Disposition:  I discussed the plan for discharge with the patient and/or their surrogate at bedside prior to discharge and they were in agreement with the plan and verbalized understanding of the return precautions provided. All questions answered to the best of my ability. Ultimately, the patient was discharged in stable condition with stable vital signs. I am reassured that they are capable of close follow up and good social support at home.   Clinical Impression:  1. Syncope, unspecified syncope type   2. Dehydration     Rx / DC Orders ED Discharge Orders     None       The plan for this patient was discussed with Dr. Silverio Lay, who voiced agreement and who oversaw evaluation and treatment of this patient.   Clinical Complexity A medically appropriate history, review of systems, and physical exam was performed.  My independent interpretations of EKG, labs, and radiology are documented in the ED course above.   If decision rules were used in this patient's evaluation, they  are listed below.   Click here for ABCD2, HEART and other calculatorsREFRESH Note before signing   Patient's presentation is most consistent with acute presentation with potential threat to life or bodily function.  Medical Decision Making Amount and/or Complexity of Data Reviewed Independent Historian: EMS Labs: ordered. Decision-making details documented in ED  Course. Radiology: ordered. ECG/medicine tests: ordered.  Risk OTC drugs. Prescription drug management. Decision regarding hospitalization.    HPI/ROS      See MDM section for pertinent HPI and ROS. A complete ROS was performed with pertinent positives/negatives noted above.   Past Medical History:  Diagnosis Date   Acute asthmatic bronchitis    Alcohol abuse    Back pain    Benign prostatic hypertrophy    Cigarette smoker    Coronary artery disease    DJD (degenerative joint disease)    Hypertension    IBS (irritable bowel syndrome)    Myocardial infarction (HCC)    per pt he thinks he had an MI "5 years ago"   Neck pain    Psychiatric disorder     Past Surgical History:  Procedure Laterality Date   CARDIAC CATHETERIZATION     CORONARY STENT INTERVENTION N/A 04/12/2019   Procedure: CORONARY STENT INTERVENTION;  Surgeon: Runell Gess, MD;  Location: [facility] INVASIVE CV LAB;  Service: Cardiovascular;  Laterality: N/A;   CYSTOSCOPY  1999   Dr. [name]   HAND SURGERY     LEFT HEART CATH AND CORONARY ANGIOGRAPHY N/A 04/12/2019   Procedure: LEFT HEART CATH AND CORONARY ANGIOGRAPHY;  Surgeon: Runell Gess, MD;  Location: [facility] INVASIVE CV LAB;  Service: Cardiovascular;  Laterality: N/A;   TONSILLECTOMY        Physical Exam   Vitals:   11/17/22 1915 11/17/22 2000 11/17/22 2015 11/17/22 2050  BP: (!) 101/56 (!) 111/58 120/63 121/67  Pulse: 76 69 70 76  Resp: 15 16 15 18   Temp:    98 F (36.7 C)  TempSrc:    Oral  SpO2: 98% 100% 100% 98%  Weight:      Height:        Physical Exam Vitals and nursing note reviewed.  Constitutional:      General: He is not in acute distress.    Appearance: He is well-developed.  HENT:     Head: Normocephalic and atraumatic.     Mouth/Throat:     Mouth: Mucous membranes are moist.  Eyes:     Conjunctiva/sclera: Conjunctivae normal.  Cardiovascular:     Rate and Rhythm: Normal rate and regular rhythm.     Pulses: Normal pulses.      Heart sounds: Normal heart sounds. No murmur heard. Pulmonary:     Effort: Pulmonary effort is normal. No respiratory distress.     Breath sounds: Normal breath sounds. No wheezing, rhonchi or rales.  Abdominal:     Palpations: Abdomen is soft.     Tenderness: There is no abdominal tenderness.  Musculoskeletal:        General: No swelling.     Cervical back: Neck supple.  Skin:    General: Skin is warm and dry.     Capillary Refill: Capillary refill takes less than 2 seconds.  Neurological:     General: No focal deficit present.     Mental Status: He is alert and oriented to person, place, and time.  Psychiatric:        Mood and Affect: Mood normal.      Procedures  If procedures were preformed on this patient, they are listed below:  Procedures   Fayrene Helper, MD Emergency Medicine PGY-2   Please note that this documentation was produced with the assistance of voice-to-text technology and may contain errors.    Fayrene Helper, MD 11/17/22 2340    Charlynne Pander, MD 11/19/22 [phone]

## 2022-11-17 NOTE — ED Triage Notes (Signed)
BIBA from home, initial BP 88/43, received 500 mL NS, Improved, believed to have syncopal episode, A&Ox4

## 2022-11-17 NOTE — ED Notes (Signed)
Discharge instructions discussed with pt. Verbalized understanding. VSS. No questions or concerns regarding discharge  

## 2022-11-17 NOTE — Discharge Instructions (Addendum)
You are seen today in the emergency department after you passed out.  While you are here we checked labs, monitored your vital signs, got imaging, and all of these were reassuring and there is no indication for any further testing or intervention here in the emergency department.  As we discussed, because of your history you have a higher risk than most for problems with your heart causing you to pass out.  That is why it is incredibly important that you follow-up with your primary care provider as soon as you are able so they are able to continue your care and can follow-up on whether or not you need any further testing like ultrasounds of your heart.  Return to the emergency department if you have any new or worsening concerns, chest pain, passout again or lightheadedness or any other complaints.

## 2022-11-18 ENCOUNTER — Telehealth: Payer: Self-pay

## 2022-11-18 NOTE — Transitions of Care (Post Inpatient/ED Visit) (Signed)
   11/18/2022  Name: JAMESPAUL SECRIST MRN: [id number] DOB: 1946/01/23  Today's TOC FU Call Status: Today's TOC FU Call Status:: Unsuccessul Call (1st Attempt) Unsuccessful Call (1st Attempt) Date: 11/18/22  Attempted to reach the patient regarding the most recent Inpatient/ED visit.  Follow Up Plan: Additional outreach attempts will be made to reach the patient to complete the Transitions of Care (Post Inpatient/ED visit) call.   Signature Renelda Loma RMA

## 2022-11-19 ENCOUNTER — Telehealth: Payer: Self-pay

## 2022-11-19 NOTE — Transitions of Care (Post Inpatient/ED Visit) (Signed)
   11/19/2022  Name: XYLON CROOM MRN: [id number] DOB: 1945-06-19  Pt advised that he is no longer coming to our office. Pt pcp is at [facility]. Pt was advised he wanted to return to Patient care center he could just give [place] a call to be scheduled.     Renelda Loma RMA

## 2022-11-26 ENCOUNTER — Other Ambulatory Visit (HOSPITAL_COMMUNITY): Payer: Self-pay

## 2022-11-26 MED ORDER — CYCLOBENZAPRINE HCL 5 MG PO TABS
5.0000 mg | ORAL_TABLET | Freq: Three times a day (TID) | ORAL | 0 refills | Status: AC | PRN
  Filled 2022-11-26: qty 90, 30d supply, fill #0

## 2022-11-26 MED ORDER — GABAPENTIN 300 MG PO CAPS
300.0000 mg | ORAL_CAPSULE | Freq: Every day | ORAL | 0 refills | Status: DC
Start: 2022-11-26 — End: 2022-12-24
  Filled 2022-11-26: qty 30, 30d supply, fill #0

## 2022-11-26 MED ORDER — ERGOCALCIFEROL 1.25 MG (50000 UT) PO CAPS
50000.0000 [IU] | ORAL_CAPSULE | ORAL | 1 refills | Status: AC
Start: 2022-11-26 — End: ?
  Filled 2022-11-26: qty 12, 84d supply, fill #0

## 2022-11-26 MED ORDER — HYDROCODONE-ACETAMINOPHEN 10-325 MG PO TABS
1.0000 | ORAL_TABLET | Freq: Every day | ORAL | 0 refills | Status: AC
Start: 2022-11-26 — End: ?
  Filled 2022-11-26 – 2022-11-27 (×2): qty 150, 30d supply, fill #0

## 2022-11-26 MED ORDER — NALOXONE HCL 4 MG/0.1ML NA LIQD
1.0000 | NASAL | 3 refills | Status: AC | PRN
Start: 2022-11-26 — End: ?
  Filled 2022-11-26: qty 2, 2d supply, fill #0
  Filled 2023-09-18: qty 2, 1d supply, fill #0

## 2022-11-27 ENCOUNTER — Other Ambulatory Visit (HOSPITAL_COMMUNITY): Payer: Self-pay

## 2022-12-24 ENCOUNTER — Other Ambulatory Visit (HOSPITAL_COMMUNITY): Payer: Self-pay

## 2022-12-24 MED ORDER — CYCLOBENZAPRINE HCL 5 MG PO TABS
5.0000 mg | ORAL_TABLET | Freq: Three times a day (TID) | ORAL | 0 refills | Status: AC | PRN
  Filled 2022-12-24: qty 90, 30d supply, fill #0

## 2022-12-24 MED ORDER — GABAPENTIN 300 MG PO CAPS
300.0000 mg | ORAL_CAPSULE | Freq: Every day | ORAL | 0 refills | Status: DC
  Filled 2022-12-24: qty 30, 30d supply, fill #0

## 2022-12-24 MED ORDER — HYDROCODONE-ACETAMINOPHEN 10-325 MG PO TABS
1.0000 | ORAL_TABLET | Freq: Every day | ORAL | 0 refills | Status: AC
  Filled 2022-12-27: qty 150, 30d supply, fill #0

## 2022-12-25 ENCOUNTER — Other Ambulatory Visit (HOSPITAL_COMMUNITY): Payer: Self-pay

## 2022-12-25 ENCOUNTER — Other Ambulatory Visit: Payer: Self-pay

## 2022-12-27 ENCOUNTER — Other Ambulatory Visit (HOSPITAL_COMMUNITY): Payer: Self-pay

## 2023-01-21 ENCOUNTER — Other Ambulatory Visit (HOSPITAL_COMMUNITY): Payer: Self-pay

## 2023-01-21 MED ORDER — NALOXONE HCL 4 MG/0.1ML NA LIQD
1.0000 | NASAL | 3 refills | Status: AC | PRN
  Filled 2023-01-21: qty 2, 1d supply, fill #0
  Filled 2023-01-24: qty 2, 7d supply, fill #0

## 2023-01-21 MED ORDER — GABAPENTIN 300 MG PO CAPS
300.0000 mg | ORAL_CAPSULE | Freq: Every day | ORAL | 0 refills | Status: DC
Start: 2023-01-21 — End: 2023-02-19
  Filled 2023-01-21 – 2023-01-24 (×2): qty 30, 30d supply, fill #0

## 2023-01-21 MED ORDER — HYDROCODONE-ACETAMINOPHEN 10-325 MG PO TABS
1.0000 | ORAL_TABLET | Freq: Every day | ORAL | 0 refills | Status: AC | PRN
Start: 2023-01-21 — End: ?
  Filled 2023-01-24: qty 150, 30d supply, fill #0

## 2023-01-21 MED ORDER — CYCLOBENZAPRINE HCL 5 MG PO TABS
5.0000 mg | ORAL_TABLET | Freq: Three times a day (TID) | ORAL | 0 refills | Status: AC | PRN
  Filled 2023-01-21 – 2023-01-24 (×2): qty 90, 30d supply, fill #0

## 2023-01-22 ENCOUNTER — Other Ambulatory Visit (HOSPITAL_COMMUNITY): Payer: Self-pay

## 2023-01-24 ENCOUNTER — Other Ambulatory Visit (HOSPITAL_COMMUNITY): Payer: Self-pay

## 2023-01-24 ENCOUNTER — Other Ambulatory Visit: Payer: Self-pay

## 2023-01-29 ENCOUNTER — Other Ambulatory Visit (HOSPITAL_COMMUNITY): Payer: Self-pay

## 2023-02-05 ENCOUNTER — Other Ambulatory Visit (HOSPITAL_COMMUNITY): Payer: Self-pay

## 2023-02-07 ENCOUNTER — Other Ambulatory Visit (HOSPITAL_COMMUNITY): Payer: Self-pay

## 2023-02-07 MED ORDER — LOSARTAN POTASSIUM 25 MG PO TABS
25.0000 mg | ORAL_TABLET | Freq: Every day | ORAL | 1 refills | Status: AC
  Filled 2023-02-07 – 2023-02-25 (×2): qty 90, 90d supply, fill #0
  Filled 2023-09-18: qty 90, 90d supply, fill #1

## 2023-02-18 ENCOUNTER — Other Ambulatory Visit (HOSPITAL_COMMUNITY): Payer: Self-pay

## 2023-02-19 ENCOUNTER — Other Ambulatory Visit (HOSPITAL_COMMUNITY): Payer: Self-pay

## 2023-02-19 MED ORDER — TAMSULOSIN HCL 0.4 MG PO CAPS
0.4000 mg | ORAL_CAPSULE | Freq: Every day | ORAL | 0 refills | Status: AC
  Filled 2023-02-19: qty 90, 90d supply, fill #0

## 2023-02-19 MED ORDER — HYDROCODONE-ACETAMINOPHEN 10-325 MG PO TABS
1.0000 | ORAL_TABLET | Freq: Every day | ORAL | 0 refills | Status: AC
Start: 2023-02-19 — End: ?
  Filled 2023-02-19 – 2023-02-21 (×2): qty 150, 30d supply, fill #0

## 2023-02-19 MED ORDER — CYCLOBENZAPRINE HCL 5 MG PO TABS
5.0000 mg | ORAL_TABLET | Freq: Three times a day (TID) | ORAL | 0 refills | Status: AC
  Filled 2023-02-19: qty 90, 30d supply, fill #0

## 2023-02-19 MED ORDER — GABAPENTIN 300 MG PO CAPS
300.0000 mg | ORAL_CAPSULE | Freq: Every evening | ORAL | 0 refills | Status: DC
  Filled 2023-02-19: qty 30, 30d supply, fill #0

## 2023-02-21 ENCOUNTER — Other Ambulatory Visit: Payer: Self-pay

## 2023-02-21 ENCOUNTER — Other Ambulatory Visit (HOSPITAL_COMMUNITY): Payer: Self-pay

## 2023-02-25 ENCOUNTER — Other Ambulatory Visit (HOSPITAL_COMMUNITY): Payer: Self-pay

## 2023-03-19 ENCOUNTER — Other Ambulatory Visit (HOSPITAL_COMMUNITY): Payer: Self-pay

## 2023-03-19 MED ORDER — VITAMIN D (ERGOCALCIFEROL) 1.25 MG (50000 UNIT) PO CAPS
50000.0000 [IU] | ORAL_CAPSULE | ORAL | 1 refills | Status: AC
  Filled 2023-03-19: qty 12, 84d supply, fill #0

## 2023-03-19 MED ORDER — HYDROCODONE-ACETAMINOPHEN 10-325 MG PO TABS
1.0000 | ORAL_TABLET | Freq: Every day | ORAL | 0 refills | Status: DC
Start: 2023-03-19 — End: 2023-04-17
  Filled 2023-03-19 – 2023-03-24 (×2): qty 150, 30d supply, fill #0

## 2023-03-19 MED ORDER — GABAPENTIN 300 MG PO CAPS
300.0000 mg | ORAL_CAPSULE | Freq: Every day | ORAL | 0 refills | Status: DC
Start: 2023-03-19 — End: 2023-04-17
  Filled 2023-03-19: qty 30, 30d supply, fill #0

## 2023-03-19 MED ORDER — CYCLOBENZAPRINE HCL 5 MG PO TABS
5.0000 mg | ORAL_TABLET | Freq: Three times a day (TID) | ORAL | 0 refills | Status: DC | PRN
  Filled 2023-03-19: qty 90, 30d supply, fill #0

## 2023-03-24 ENCOUNTER — Other Ambulatory Visit (HOSPITAL_COMMUNITY): Payer: Self-pay

## 2023-03-24 ENCOUNTER — Other Ambulatory Visit: Payer: Self-pay

## 2023-03-26 ENCOUNTER — Other Ambulatory Visit (HOSPITAL_COMMUNITY): Payer: Self-pay

## 2023-03-26 MED ORDER — MIRTAZAPINE 15 MG PO TABS
15.0000 mg | ORAL_TABLET | Freq: Every evening | ORAL | 2 refills | Status: AC
  Filled 2023-03-26: qty 30, 30d supply, fill #0
  Filled 2023-09-18: qty 30, 30d supply, fill #1

## 2023-03-26 MED ORDER — DOXEPIN HCL 6 MG PO TABS
6.0000 mg | ORAL_TABLET | Freq: Every day | ORAL | 1 refills | Status: AC
Start: 2023-03-26 — End: ?
  Filled 2023-03-26: qty 30, 30d supply, fill #0

## 2023-03-26 MED ORDER — PRAZOSIN HCL 1 MG PO CAPS
1.0000 mg | ORAL_CAPSULE | Freq: Every evening | ORAL | 1 refills | Status: AC
  Filled 2023-03-26: qty 30, 30d supply, fill #0
  Filled 2023-09-18: qty 30, 30d supply, fill #1

## 2023-03-26 MED ORDER — ROPINIROLE HCL 1 MG PO TABS
1.0000 mg | ORAL_TABLET | Freq: Every day | ORAL | 1 refills | Status: AC
  Filled 2023-03-26 (×2): qty 30, 30d supply, fill #0

## 2023-03-27 ENCOUNTER — Other Ambulatory Visit (HOSPITAL_COMMUNITY): Payer: Self-pay

## 2023-04-17 ENCOUNTER — Other Ambulatory Visit (HOSPITAL_COMMUNITY): Payer: Self-pay

## 2023-04-17 DIAGNOSIS — G8929 Other chronic pain: Secondary | ICD-10-CM | POA: Diagnosis not present

## 2023-04-17 DIAGNOSIS — R7303 Prediabetes: Secondary | ICD-10-CM | POA: Diagnosis not present

## 2023-04-17 DIAGNOSIS — E559 Vitamin D deficiency, unspecified: Secondary | ICD-10-CM | POA: Diagnosis not present

## 2023-04-17 DIAGNOSIS — Z9181 History of falling: Secondary | ICD-10-CM | POA: Diagnosis not present

## 2023-04-17 DIAGNOSIS — M5489 Other dorsalgia: Secondary | ICD-10-CM | POA: Diagnosis not present

## 2023-04-17 DIAGNOSIS — Z79899 Other long term (current) drug therapy: Secondary | ICD-10-CM | POA: Diagnosis not present

## 2023-04-17 MED ORDER — HYDROCODONE-ACETAMINOPHEN 10-325 MG PO TABS
1.0000 | ORAL_TABLET | Freq: Every day | ORAL | 0 refills | Status: DC
Start: 2023-04-17 — End: 2023-05-19
  Filled 2023-04-17 – 2023-04-21 (×2): qty 180, 30d supply, fill #0

## 2023-04-17 MED ORDER — GABAPENTIN 300 MG PO CAPS
300.0000 mg | ORAL_CAPSULE | Freq: Every day | ORAL | 0 refills | Status: DC
Start: 2023-04-17 — End: 2023-05-19
  Filled 2023-04-17 – 2023-04-21 (×2): qty 30, 30d supply, fill #0

## 2023-04-17 MED ORDER — CYCLOBENZAPRINE HCL 5 MG PO TABS
5.0000 mg | ORAL_TABLET | Freq: Three times a day (TID) | ORAL | 0 refills | Status: DC | PRN
  Filled 2023-04-17 – 2023-04-21 (×2): qty 90, 30d supply, fill #0

## 2023-04-17 MED ORDER — VITAMIN D (ERGOCALCIFEROL) 1.25 MG (50000 UNIT) PO CAPS
50000.0000 [IU] | ORAL_CAPSULE | ORAL | 1 refills | Status: AC
  Filled 2023-04-17 – 2023-09-18 (×2): qty 12, 84d supply, fill #0

## 2023-04-19 DIAGNOSIS — Z79899 Other long term (current) drug therapy: Secondary | ICD-10-CM | POA: Diagnosis not present

## 2023-04-21 ENCOUNTER — Other Ambulatory Visit (HOSPITAL_COMMUNITY): Payer: Self-pay

## 2023-04-21 ENCOUNTER — Other Ambulatory Visit: Payer: Self-pay

## 2023-05-19 ENCOUNTER — Other Ambulatory Visit (HOSPITAL_COMMUNITY): Payer: Self-pay

## 2023-05-19 DIAGNOSIS — M5489 Other dorsalgia: Secondary | ICD-10-CM | POA: Diagnosis not present

## 2023-05-19 DIAGNOSIS — G8929 Other chronic pain: Secondary | ICD-10-CM | POA: Diagnosis not present

## 2023-05-19 DIAGNOSIS — I10 Essential (primary) hypertension: Secondary | ICD-10-CM | POA: Diagnosis not present

## 2023-05-19 DIAGNOSIS — Z79899 Other long term (current) drug therapy: Secondary | ICD-10-CM | POA: Diagnosis not present

## 2023-05-19 MED ORDER — HYDROCODONE-ACETAMINOPHEN 10-325 MG PO TABS
1.0000 | ORAL_TABLET | Freq: Every day | ORAL | 0 refills | Status: AC
Start: 2023-05-19 — End: ?
  Filled 2023-05-19 (×2): qty 180, 30d supply, fill #0

## 2023-05-19 MED ORDER — CYCLOBENZAPRINE HCL 5 MG PO TABS
5.0000 mg | ORAL_TABLET | Freq: Three times a day (TID) | ORAL | 0 refills | Status: DC | PRN
  Filled 2023-05-19 (×2): qty 90, 30d supply, fill #0

## 2023-05-19 MED ORDER — GABAPENTIN 300 MG PO CAPS
300.0000 mg | ORAL_CAPSULE | Freq: Every day | ORAL | 0 refills | Status: DC
Start: 2023-05-19 — End: 2023-06-16
  Filled 2023-05-19 (×2): qty 30, 30d supply, fill #0

## 2023-05-20 ENCOUNTER — Other Ambulatory Visit (HOSPITAL_COMMUNITY): Payer: Self-pay

## 2023-06-16 ENCOUNTER — Other Ambulatory Visit (HOSPITAL_COMMUNITY): Payer: Self-pay

## 2023-06-16 DIAGNOSIS — R7303 Prediabetes: Secondary | ICD-10-CM | POA: Diagnosis not present

## 2023-06-16 DIAGNOSIS — E559 Vitamin D deficiency, unspecified: Secondary | ICD-10-CM | POA: Diagnosis not present

## 2023-06-16 DIAGNOSIS — Z9181 History of falling: Secondary | ICD-10-CM | POA: Diagnosis not present

## 2023-06-16 DIAGNOSIS — Z6824 Body mass index (BMI) 24.0-24.9, adult: Secondary | ICD-10-CM | POA: Diagnosis not present

## 2023-06-16 DIAGNOSIS — M129 Arthropathy, unspecified: Secondary | ICD-10-CM | POA: Diagnosis not present

## 2023-06-16 DIAGNOSIS — Z0000 Encounter for general adult medical examination without abnormal findings: Secondary | ICD-10-CM | POA: Diagnosis not present

## 2023-06-16 DIAGNOSIS — Z760 Encounter for issue of repeat prescription: Secondary | ICD-10-CM | POA: Diagnosis not present

## 2023-06-16 DIAGNOSIS — Z79899 Other long term (current) drug therapy: Secondary | ICD-10-CM | POA: Diagnosis not present

## 2023-06-16 DIAGNOSIS — M5489 Other dorsalgia: Secondary | ICD-10-CM | POA: Diagnosis not present

## 2023-06-16 DIAGNOSIS — G8929 Other chronic pain: Secondary | ICD-10-CM | POA: Diagnosis not present

## 2023-06-16 MED ORDER — TAMSULOSIN HCL 0.4 MG PO CAPS
0.4000 mg | ORAL_CAPSULE | Freq: Every day | ORAL | 0 refills | Status: AC
  Filled 2023-06-16: qty 30, 30d supply, fill #0

## 2023-06-16 MED ORDER — CYCLOBENZAPRINE HCL 5 MG PO TABS
5.0000 mg | ORAL_TABLET | Freq: Three times a day (TID) | ORAL | 0 refills | Status: AC | PRN
  Filled 2023-06-16: qty 90, 30d supply, fill #0

## 2023-06-16 MED ORDER — HYDROCODONE-ACETAMINOPHEN 10-325 MG PO TABS
1.0000 | ORAL_TABLET | ORAL | 0 refills | Status: AC | PRN
Start: 2023-06-16 — End: ?
  Filled 2023-06-16: qty 180, 30d supply, fill #0

## 2023-06-16 MED ORDER — ERGOCALCIFEROL 1.25 MG (50000 UT) PO CAPS
50000.0000 [IU] | ORAL_CAPSULE | ORAL | 1 refills | Status: AC
Start: 2023-06-16 — End: ?
  Filled 2023-06-16: qty 12, 84d supply, fill #0

## 2023-06-16 MED ORDER — GABAPENTIN 300 MG PO CAPS
300.0000 mg | ORAL_CAPSULE | Freq: Every day | ORAL | 0 refills | Status: DC
  Filled 2023-06-16: qty 30, 30d supply, fill #0

## 2023-06-19 DIAGNOSIS — Z79899 Other long term (current) drug therapy: Secondary | ICD-10-CM | POA: Diagnosis not present

## 2023-07-15 ENCOUNTER — Other Ambulatory Visit: Payer: Self-pay

## 2023-07-15 ENCOUNTER — Other Ambulatory Visit (HOSPITAL_COMMUNITY): Payer: Self-pay

## 2023-07-15 DIAGNOSIS — E559 Vitamin D deficiency, unspecified: Secondary | ICD-10-CM | POA: Diagnosis not present

## 2023-07-15 DIAGNOSIS — G8929 Other chronic pain: Secondary | ICD-10-CM | POA: Diagnosis not present

## 2023-07-15 DIAGNOSIS — M549 Dorsalgia, unspecified: Secondary | ICD-10-CM | POA: Diagnosis not present

## 2023-07-15 DIAGNOSIS — Z9181 History of falling: Secondary | ICD-10-CM | POA: Diagnosis not present

## 2023-07-15 DIAGNOSIS — Z6824 Body mass index (BMI) 24.0-24.9, adult: Secondary | ICD-10-CM | POA: Diagnosis not present

## 2023-07-15 DIAGNOSIS — R7303 Prediabetes: Secondary | ICD-10-CM | POA: Diagnosis not present

## 2023-07-15 DIAGNOSIS — Z79899 Other long term (current) drug therapy: Secondary | ICD-10-CM | POA: Diagnosis not present

## 2023-07-15 MED ORDER — HYDROCODONE-ACETAMINOPHEN 10-325 MG PO TABS
1.0000 | ORAL_TABLET | Freq: Every day | ORAL | 0 refills | Status: DC
Start: 2023-07-15 — End: 2023-08-12
  Filled 2023-07-15: qty 180, 30d supply, fill #0

## 2023-07-15 MED ORDER — GABAPENTIN 300 MG PO CAPS
300.0000 mg | ORAL_CAPSULE | Freq: Every evening | ORAL | 0 refills | Status: DC
Start: 2023-07-15 — End: 2023-08-12
  Filled 2023-07-15: qty 30, 30d supply, fill #0

## 2023-07-15 MED ORDER — CYCLOBENZAPRINE HCL 5 MG PO TABS
5.0000 mg | ORAL_TABLET | Freq: Three times a day (TID) | ORAL | 0 refills | Status: DC | PRN
  Filled 2023-07-15: qty 90, 30d supply, fill #0

## 2023-07-17 DIAGNOSIS — Z79899 Other long term (current) drug therapy: Secondary | ICD-10-CM | POA: Diagnosis not present

## 2023-08-12 ENCOUNTER — Other Ambulatory Visit (HOSPITAL_COMMUNITY): Payer: Self-pay

## 2023-08-12 DIAGNOSIS — Z79899 Other long term (current) drug therapy: Secondary | ICD-10-CM | POA: Diagnosis not present

## 2023-08-12 DIAGNOSIS — M51362 Other intervertebral disc degeneration, lumbar region with discogenic back pain and lower extremity pain: Secondary | ICD-10-CM | POA: Diagnosis not present

## 2023-08-12 DIAGNOSIS — E559 Vitamin D deficiency, unspecified: Secondary | ICD-10-CM | POA: Diagnosis not present

## 2023-08-12 DIAGNOSIS — Z9181 History of falling: Secondary | ICD-10-CM | POA: Diagnosis not present

## 2023-08-12 DIAGNOSIS — R7303 Prediabetes: Secondary | ICD-10-CM | POA: Diagnosis not present

## 2023-08-12 MED ORDER — CYCLOBENZAPRINE HCL 5 MG PO TABS
5.0000 mg | ORAL_TABLET | Freq: Three times a day (TID) | ORAL | 0 refills | Status: DC | PRN
  Filled 2023-08-12: qty 90, 30d supply, fill #0

## 2023-08-12 MED ORDER — HYDROCODONE-ACETAMINOPHEN 10-325 MG PO TABS
1.0000 | ORAL_TABLET | Freq: Every day | ORAL | 0 refills | Status: DC
  Filled 2023-08-12: qty 180, 30d supply, fill #0

## 2023-08-12 MED ORDER — GABAPENTIN 300 MG PO CAPS
300.0000 mg | ORAL_CAPSULE | Freq: Every day | ORAL | 0 refills | Status: DC
  Filled 2023-08-12: qty 30, 30d supply, fill #0

## 2023-08-12 MED ORDER — ERGOCALCIFEROL 1.25 MG (50000 UT) PO CAPS
50000.0000 [IU] | ORAL_CAPSULE | ORAL | 1 refills | Status: AC
  Filled 2023-08-12: qty 12, 84d supply, fill #0

## 2023-08-14 DIAGNOSIS — Z79899 Other long term (current) drug therapy: Secondary | ICD-10-CM | POA: Diagnosis not present

## 2023-09-11 ENCOUNTER — Other Ambulatory Visit (HOSPITAL_COMMUNITY): Payer: Self-pay

## 2023-09-11 DIAGNOSIS — E559 Vitamin D deficiency, unspecified: Secondary | ICD-10-CM | POA: Diagnosis not present

## 2023-09-11 DIAGNOSIS — Z79899 Other long term (current) drug therapy: Secondary | ICD-10-CM | POA: Diagnosis not present

## 2023-09-11 DIAGNOSIS — Z9181 History of falling: Secondary | ICD-10-CM | POA: Diagnosis not present

## 2023-09-11 DIAGNOSIS — M51362 Other intervertebral disc degeneration, lumbar region with discogenic back pain and lower extremity pain: Secondary | ICD-10-CM | POA: Diagnosis not present

## 2023-09-11 DIAGNOSIS — R7303 Prediabetes: Secondary | ICD-10-CM | POA: Diagnosis not present

## 2023-09-11 MED ORDER — HYDROCODONE-ACETAMINOPHEN 10-325 MG PO TABS
1.0000 | ORAL_TABLET | Freq: Every day | ORAL | 0 refills | Status: DC
  Filled 2023-09-11: qty 180, 30d supply, fill #0

## 2023-09-11 MED ORDER — CYCLOBENZAPRINE HCL 5 MG PO TABS
5.0000 mg | ORAL_TABLET | Freq: Three times a day (TID) | ORAL | 0 refills | Status: DC | PRN
Start: 2023-09-11 — End: 2023-10-13
  Filled 2023-09-11: qty 90, 30d supply, fill #0

## 2023-09-11 MED ORDER — GABAPENTIN 300 MG PO CAPS
300.0000 mg | ORAL_CAPSULE | Freq: Every day | ORAL | 0 refills | Status: DC
  Filled 2023-09-11: qty 30, 30d supply, fill #0

## 2023-09-15 DIAGNOSIS — Z79899 Other long term (current) drug therapy: Secondary | ICD-10-CM | POA: Diagnosis not present

## 2023-09-16 ENCOUNTER — Other Ambulatory Visit (HOSPITAL_COMMUNITY): Payer: Self-pay | Admitting: Physician Assistant

## 2023-09-16 DIAGNOSIS — Z87891 Personal history of nicotine dependence: Secondary | ICD-10-CM

## 2023-09-18 ENCOUNTER — Other Ambulatory Visit (HOSPITAL_COMMUNITY): Payer: Self-pay

## 2023-09-24 ENCOUNTER — Ambulatory Visit (HOSPITAL_COMMUNITY)
Admission: RE | Admit: 2023-09-24 | Discharge: 2023-09-24 | Disposition: A | Discharge status: Home / Self Care | Source: Ambulatory Visit | Attending: Physician Assistant | Admitting: Physician Assistant

## 2023-09-24 DIAGNOSIS — Z87891 Personal history of nicotine dependence: Secondary | ICD-10-CM | POA: Insufficient documentation

## 2023-09-30 ENCOUNTER — Other Ambulatory Visit (HOSPITAL_COMMUNITY): Payer: Self-pay

## 2023-10-13 ENCOUNTER — Other Ambulatory Visit (HOSPITAL_COMMUNITY): Payer: Self-pay

## 2023-10-13 DIAGNOSIS — Z79899 Other long term (current) drug therapy: Secondary | ICD-10-CM | POA: Diagnosis not present

## 2023-10-13 DIAGNOSIS — I10 Essential (primary) hypertension: Secondary | ICD-10-CM | POA: Diagnosis not present

## 2023-10-13 DIAGNOSIS — M51362 Other intervertebral disc degeneration, lumbar region with discogenic back pain and lower extremity pain: Secondary | ICD-10-CM | POA: Diagnosis not present

## 2023-10-13 MED ORDER — CYCLOBENZAPRINE HCL 5 MG PO TABS
5.0000 mg | ORAL_TABLET | Freq: Three times a day (TID) | ORAL | 0 refills | Status: DC | PRN
  Filled 2023-10-13: qty 90, 30d supply, fill #0

## 2023-10-13 MED ORDER — GABAPENTIN 300 MG PO CAPS
300.0000 mg | ORAL_CAPSULE | Freq: Every day | ORAL | 0 refills | Status: DC
  Filled 2023-10-13: qty 30, 30d supply, fill #0

## 2023-10-13 MED ORDER — HYDROCODONE-ACETAMINOPHEN 10-325 MG PO TABS
1.0000 | ORAL_TABLET | Freq: Every day | ORAL | 0 refills | Status: DC
  Filled 2023-10-13: qty 180, 30d supply, fill #0

## 2023-10-14 ENCOUNTER — Other Ambulatory Visit (HOSPITAL_COMMUNITY): Payer: Self-pay

## 2023-11-12 ENCOUNTER — Other Ambulatory Visit (HOSPITAL_COMMUNITY): Payer: Self-pay

## 2023-11-12 MED ORDER — CYCLOBENZAPRINE HCL 5 MG PO TABS
5.0000 mg | ORAL_TABLET | Freq: Three times a day (TID) | ORAL | 0 refills | Status: DC | PRN
  Filled 2023-11-12: qty 90, 30d supply, fill #0

## 2023-11-12 MED ORDER — GABAPENTIN 300 MG PO CAPS
300.0000 mg | ORAL_CAPSULE | Freq: Every day | ORAL | 0 refills | Status: DC
  Filled 2023-11-12: qty 30, 30d supply, fill #0

## 2023-11-12 MED ORDER — HYDROCODONE-ACETAMINOPHEN 10-325 MG PO TABS
1.0000 | ORAL_TABLET | Freq: Every day | ORAL | 0 refills | Status: DC
  Filled 2023-11-12: qty 180, 30d supply, fill #0

## 2023-11-12 MED ORDER — NALOXONE HCL 4 MG/0.1ML NA LIQD
NASAL | 3 refills | Status: AC
  Filled 2023-11-12: qty 2, 2d supply, fill #0

## 2023-12-11 ENCOUNTER — Other Ambulatory Visit (HOSPITAL_COMMUNITY): Payer: Self-pay

## 2023-12-11 MED ORDER — CYCLOBENZAPRINE HCL 5 MG PO TABS
5.0000 mg | ORAL_TABLET | Freq: Three times a day (TID) | ORAL | 0 refills | Status: DC | PRN
  Filled 2023-12-11: qty 90, 30d supply, fill #0

## 2023-12-11 MED ORDER — HYDROCODONE-ACETAMINOPHEN 10-325 MG PO TABS
1.0000 | ORAL_TABLET | Freq: Every day | ORAL | 0 refills | Status: DC
  Filled 2023-12-11: qty 180, 30d supply, fill #0

## 2023-12-11 MED ORDER — GABAPENTIN 300 MG PO CAPS
300.0000 mg | ORAL_CAPSULE | Freq: Every day | ORAL | 0 refills | Status: DC
  Filled 2023-12-11: qty 30, 30d supply, fill #0

## 2023-12-11 MED ORDER — VITAMIN D (ERGOCALCIFEROL) 1.25 MG (50000 UNIT) PO CAPS
50000.0000 [IU] | ORAL_CAPSULE | ORAL | 1 refills | Status: DC
  Filled 2023-12-11: qty 12, 84d supply, fill #0

## 2024-01-09 ENCOUNTER — Other Ambulatory Visit (HOSPITAL_COMMUNITY): Payer: Self-pay

## 2024-01-09 MED ORDER — NALOXONE HCL 4 MG/0.1ML NA LIQD
1.0000 | NASAL | 3 refills | Status: AC | PRN
  Filled 2024-01-09: qty 2, 1d supply, fill #0

## 2024-01-09 MED ORDER — HYDROCODONE-ACETAMINOPHEN 10-325 MG PO TABS
1.0000 | ORAL_TABLET | Freq: Every day | ORAL | 0 refills | Status: DC
  Filled 2024-01-09: qty 180, 30d supply, fill #0

## 2024-01-09 MED ORDER — VITAMIN D (ERGOCALCIFEROL) 1.25 MG (50000 UNIT) PO CAPS: 50000.0000 [IU] | ORAL_CAPSULE | ORAL | 1 refills | Status: DC

## 2024-01-09 MED ORDER — GABAPENTIN 300 MG PO CAPS
300.0000 mg | ORAL_CAPSULE | Freq: Every day | ORAL | 0 refills | Status: DC
  Filled 2024-01-09: qty 30, 30d supply, fill #0

## 2024-01-09 MED ORDER — CYCLOBENZAPRINE HCL 5 MG PO TABS
5.0000 mg | ORAL_TABLET | Freq: Three times a day (TID) | ORAL | 0 refills | Status: DC | PRN
  Filled 2024-01-09: qty 90, 30d supply, fill #0

## 2024-01-12 ENCOUNTER — Other Ambulatory Visit (HOSPITAL_COMMUNITY): Payer: Self-pay

## 2024-02-06 ENCOUNTER — Other Ambulatory Visit: Payer: Self-pay

## 2024-02-06 ENCOUNTER — Other Ambulatory Visit (HOSPITAL_COMMUNITY): Payer: Self-pay

## 2024-02-06 MED ORDER — CYCLOBENZAPRINE HCL 5 MG PO TABS
5.0000 mg | ORAL_TABLET | Freq: Three times a day (TID) | ORAL | 0 refills | Status: DC | PRN
  Filled 2024-02-06: qty 90, 30d supply, fill #0

## 2024-02-06 MED ORDER — GABAPENTIN 300 MG PO CAPS
300.0000 mg | ORAL_CAPSULE | Freq: Every day | ORAL | 0 refills | Status: DC
  Filled 2024-02-06: qty 30, 30d supply, fill #0

## 2024-02-06 MED ORDER — HYDROCODONE-ACETAMINOPHEN 10-325 MG PO TABS
1.0000 | ORAL_TABLET | Freq: Every day | ORAL | 0 refills | Status: DC
  Filled 2024-02-06 – 2024-02-09 (×2): qty 180, 30d supply, fill #0

## 2024-02-09 ENCOUNTER — Other Ambulatory Visit (HOSPITAL_COMMUNITY): Payer: Self-pay

## 2024-03-02 ENCOUNTER — Other Ambulatory Visit (HOSPITAL_COMMUNITY): Payer: Self-pay

## 2024-03-02 MED ORDER — BUDESONIDE-FORMOTEROL FUMARATE 80-4.5 MCG/ACT IN AERO
2.0000 | INHALATION_SPRAY | Freq: Two times a day (BID) | RESPIRATORY_TRACT | 12 refills | Status: AC
  Filled 2024-03-02: qty 10.2, 30d supply, fill #0

## 2024-03-02 MED ORDER — ALBUTEROL SULFATE HFA 108 (90 BASE) MCG/ACT IN AERS
2.0000 | INHALATION_SPRAY | RESPIRATORY_TRACT | 6 refills | Status: AC | PRN
  Filled 2024-03-02: qty 36, 30d supply, fill #0

## 2024-03-04 ENCOUNTER — Other Ambulatory Visit: Payer: Self-pay

## 2024-03-09 ENCOUNTER — Other Ambulatory Visit (HOSPITAL_COMMUNITY): Payer: Self-pay

## 2024-03-09 MED ORDER — VITAMIN D (ERGOCALCIFEROL) 1.25 MG (50000 UNIT) PO CAPS
50000.0000 [IU] | ORAL_CAPSULE | ORAL | 1 refills | Status: DC
  Filled 2024-03-09: qty 12, 84d supply, fill #0

## 2024-03-09 MED ORDER — CYCLOBENZAPRINE HCL 5 MG PO TABS
5.0000 mg | ORAL_TABLET | Freq: Three times a day (TID) | ORAL | 0 refills | Status: DC | PRN
  Filled 2024-03-09: qty 90, 30d supply, fill #0

## 2024-03-09 MED ORDER — GABAPENTIN 300 MG PO CAPS
300.0000 mg | ORAL_CAPSULE | Freq: Every evening | ORAL | 0 refills | Status: DC
  Filled 2024-03-09: qty 30, 30d supply, fill #0

## 2024-03-09 MED ORDER — HYDROCODONE-ACETAMINOPHEN 10-325 MG PO TABS
1.0000 | ORAL_TABLET | Freq: Every day | ORAL | 0 refills | Status: DC
  Filled 2024-03-09: qty 180, 30d supply, fill #0

## 2024-03-10 ENCOUNTER — Other Ambulatory Visit (HOSPITAL_COMMUNITY): Payer: Self-pay

## 2024-03-11 ENCOUNTER — Other Ambulatory Visit: Payer: Self-pay

## 2024-03-25 ENCOUNTER — Other Ambulatory Visit (HOSPITAL_COMMUNITY): Payer: Self-pay

## 2024-03-25 MED ORDER — VITAMIN D3 50 MCG (2000 UT) PO TABS
2000.0000 [IU] | ORAL_TABLET | Freq: Every day | ORAL | 1 refills | Status: AC
  Filled 2024-03-25: qty 100, 100d supply, fill #0

## 2024-04-06 ENCOUNTER — Other Ambulatory Visit (HOSPITAL_COMMUNITY): Payer: Self-pay

## 2024-04-06 MED ORDER — GABAPENTIN 300 MG PO CAPS
300.0000 mg | ORAL_CAPSULE | Freq: Every day | ORAL | 0 refills | Status: AC
  Filled 2024-04-06: qty 30, 30d supply, fill #0

## 2024-04-06 MED ORDER — HYDROCODONE-ACETAMINOPHEN 10-325 MG PO TABS
1.0000 | ORAL_TABLET | Freq: Every day | ORAL | 0 refills | Status: DC
  Filled 2024-04-08: qty 180, 30d supply, fill #0

## 2024-04-06 MED ORDER — CYCLOBENZAPRINE HCL 5 MG PO TABS
5.0000 mg | ORAL_TABLET | Freq: Three times a day (TID) | ORAL | 0 refills | Status: DC | PRN
  Filled 2024-04-06: qty 90, 30d supply, fill #0

## 2024-04-08 ENCOUNTER — Other Ambulatory Visit: Payer: Self-pay

## 2024-04-08 ENCOUNTER — Other Ambulatory Visit (HOSPITAL_COMMUNITY): Payer: Self-pay

## 2024-05-18 ENCOUNTER — Other Ambulatory Visit (HOSPITAL_COMMUNITY): Payer: Self-pay

## 2024-05-18 MED ORDER — CYCLOBENZAPRINE HCL 5 MG PO TABS
5.0000 mg | ORAL_TABLET | Freq: Three times a day (TID) | ORAL | 0 refills | Status: AC | PRN
  Filled 2024-05-18: qty 90, 30d supply, fill #0

## 2024-05-18 MED ORDER — GABAPENTIN 300 MG PO CAPS
300.0000 mg | ORAL_CAPSULE | Freq: Every day | ORAL | 0 refills | Status: AC
  Filled 2024-05-18: qty 30, 30d supply, fill #0

## 2024-05-18 MED ORDER — HYDROCODONE-ACETAMINOPHEN 10-325 MG PO TABS
1.0000 | ORAL_TABLET | Freq: Every day | ORAL | 0 refills | Status: AC
  Filled 2024-05-18: qty 180, 30d supply, fill #0
